# Patient Record
Sex: Female | Born: 1950 | Race: White | Hispanic: No | Marital: Married | Smoking: Never smoker
Health system: Southern US, Community
[De-identification: ages and names within clinical notes are randomized; demographics above are authoritative.]

## PROBLEM LIST (undated history)

## (undated) DIAGNOSIS — I10 Essential (primary) hypertension: Secondary | ICD-10-CM

## (undated) DIAGNOSIS — K649 Unspecified hemorrhoids: Secondary | ICD-10-CM

## (undated) DIAGNOSIS — N289 Disorder of kidney and ureter, unspecified: Secondary | ICD-10-CM

## (undated) DIAGNOSIS — Z9889 Other specified postprocedural states: Secondary | ICD-10-CM

## (undated) DIAGNOSIS — K5790 Diverticulosis of intestine, part unspecified, without perforation or abscess without bleeding: Secondary | ICD-10-CM

## (undated) DIAGNOSIS — D126 Benign neoplasm of colon, unspecified: Secondary | ICD-10-CM

## (undated) DIAGNOSIS — R112 Nausea with vomiting, unspecified: Secondary | ICD-10-CM

## (undated) DIAGNOSIS — IMO0002 Reserved for concepts with insufficient information to code with codable children: Secondary | ICD-10-CM

## (undated) DIAGNOSIS — F419 Anxiety disorder, unspecified: Secondary | ICD-10-CM

## (undated) DIAGNOSIS — M1990 Unspecified osteoarthritis, unspecified site: Secondary | ICD-10-CM

## (undated) HISTORY — DX: Disorder of kidney and ureter, unspecified: N28.9

## (undated) HISTORY — PX: JOINT REPLACEMENT: SHX530

## (undated) HISTORY — DX: Reserved for concepts with insufficient information to code with codable children: IMO0002

## (undated) HISTORY — PX: ROTATOR CUFF REPAIR: SHX139

## (undated) HISTORY — DX: Essential (primary) hypertension: I10

## (undated) HISTORY — DX: Benign neoplasm of colon, unspecified: D12.6

## (undated) HISTORY — PX: COLONOSCOPY: SHX174

## (undated) HISTORY — DX: Unspecified hemorrhoids: K64.9

## (undated) HISTORY — PX: REPLACEMENT DISC ANTERIOR LUMBAR SPINE: SUR1215

## (undated) HISTORY — PX: AIKEN OSTEOTOMY: SHX6331

## (undated) HISTORY — DX: Diverticulosis of intestine, part unspecified, without perforation or abscess without bleeding: K57.90

---

## 1984-12-13 HISTORY — PX: TUBAL LIGATION: SHX77

## 1998-07-22 ENCOUNTER — Ambulatory Visit (HOSPITAL_COMMUNITY): Admission: RE | Admit: 1998-07-22 | Discharge: 1998-07-22 | Payer: Self-pay | Admitting: Family Medicine

## 1998-07-23 ENCOUNTER — Ambulatory Visit (HOSPITAL_COMMUNITY): Admission: RE | Admit: 1998-07-23 | Discharge: 1998-07-23 | Payer: Self-pay | Admitting: Family Medicine

## 1999-02-20 ENCOUNTER — Ambulatory Visit (HOSPITAL_COMMUNITY): Admission: RE | Admit: 1999-02-20 | Discharge: 1999-02-20 | Payer: Self-pay | Admitting: Family Medicine

## 1999-02-20 ENCOUNTER — Encounter: Payer: Self-pay | Admitting: Family Medicine

## 1999-09-04 ENCOUNTER — Ambulatory Visit (HOSPITAL_COMMUNITY): Admission: RE | Admit: 1999-09-04 | Discharge: 1999-09-04 | Payer: Self-pay | Admitting: Family Medicine

## 1999-09-04 ENCOUNTER — Encounter: Payer: Self-pay | Admitting: Family Medicine

## 1999-09-11 ENCOUNTER — Encounter: Payer: Self-pay | Admitting: Family Medicine

## 1999-09-11 ENCOUNTER — Ambulatory Visit (HOSPITAL_COMMUNITY): Admission: RE | Admit: 1999-09-11 | Discharge: 1999-09-11 | Payer: Self-pay | Admitting: Family Medicine

## 1999-09-16 ENCOUNTER — Ambulatory Visit (HOSPITAL_COMMUNITY): Admission: RE | Admit: 1999-09-16 | Discharge: 1999-09-16 | Payer: Self-pay | Admitting: Family Medicine

## 1999-09-16 ENCOUNTER — Encounter: Payer: Self-pay | Admitting: Family Medicine

## 2000-04-08 ENCOUNTER — Encounter: Admission: RE | Admit: 2000-04-08 | Discharge: 2000-04-08 | Payer: Self-pay | Admitting: Family Medicine

## 2000-04-08 ENCOUNTER — Encounter: Payer: Self-pay | Admitting: Family Medicine

## 2000-09-30 ENCOUNTER — Encounter: Payer: Self-pay | Admitting: Family Medicine

## 2000-09-30 ENCOUNTER — Encounter: Admission: RE | Admit: 2000-09-30 | Discharge: 2000-09-30 | Payer: Self-pay | Admitting: Family Medicine

## 2001-09-29 ENCOUNTER — Encounter: Admission: RE | Admit: 2001-09-29 | Discharge: 2001-09-29 | Payer: Self-pay | Admitting: Family Medicine

## 2001-09-29 ENCOUNTER — Encounter: Payer: Self-pay | Admitting: Family Medicine

## 2001-10-06 ENCOUNTER — Encounter: Admission: RE | Admit: 2001-10-06 | Discharge: 2001-10-06 | Payer: Self-pay | Admitting: Family Medicine

## 2001-10-06 ENCOUNTER — Encounter: Payer: Self-pay | Admitting: Family Medicine

## 2002-10-08 ENCOUNTER — Encounter: Admission: RE | Admit: 2002-10-08 | Discharge: 2002-10-08 | Payer: Self-pay | Admitting: Family Medicine

## 2002-10-08 ENCOUNTER — Encounter: Payer: Self-pay | Admitting: Family Medicine

## 2002-10-12 ENCOUNTER — Other Ambulatory Visit: Admission: RE | Admit: 2002-10-12 | Discharge: 2002-10-12 | Payer: Self-pay | Admitting: Family Medicine

## 2003-09-13 ENCOUNTER — Other Ambulatory Visit: Admission: RE | Admit: 2003-09-13 | Discharge: 2003-09-13 | Payer: Self-pay | Admitting: Family Medicine

## 2003-10-11 ENCOUNTER — Encounter: Admission: RE | Admit: 2003-10-11 | Discharge: 2003-10-11 | Payer: Self-pay | Admitting: Family Medicine

## 2004-08-28 ENCOUNTER — Other Ambulatory Visit: Admission: RE | Admit: 2004-08-28 | Discharge: 2004-08-28 | Payer: Self-pay | Admitting: Family Medicine

## 2004-10-12 ENCOUNTER — Encounter: Admission: RE | Admit: 2004-10-12 | Discharge: 2004-10-12 | Payer: Self-pay | Admitting: Family Medicine

## 2005-06-12 HISTORY — PX: ABDOMINAL HYSTERECTOMY: SHX81

## 2005-06-30 ENCOUNTER — Encounter (INDEPENDENT_AMBULATORY_CARE_PROVIDER_SITE_OTHER): Payer: Self-pay | Admitting: *Deleted

## 2005-07-01 ENCOUNTER — Inpatient Hospital Stay (HOSPITAL_COMMUNITY): Admission: RE | Admit: 2005-07-01 | Discharge: 2005-07-02 | Payer: Self-pay | Admitting: Obstetrics and Gynecology

## 2005-07-28 ENCOUNTER — Ambulatory Visit (HOSPITAL_COMMUNITY): Admission: RE | Admit: 2005-07-28 | Discharge: 2005-07-28 | Payer: Self-pay | Admitting: Urology

## 2005-07-28 ENCOUNTER — Ambulatory Visit (HOSPITAL_BASED_OUTPATIENT_CLINIC_OR_DEPARTMENT_OTHER): Admission: RE | Admit: 2005-07-28 | Discharge: 2005-07-28 | Payer: Self-pay | Admitting: Urology

## 2005-10-22 ENCOUNTER — Encounter: Admission: RE | Admit: 2005-10-22 | Discharge: 2005-10-22 | Payer: Self-pay | Admitting: Family Medicine

## 2005-10-31 ENCOUNTER — Emergency Department (HOSPITAL_COMMUNITY): Admission: EM | Admit: 2005-10-31 | Discharge: 2005-10-31 | Payer: Self-pay | Admitting: Emergency Medicine

## 2006-05-15 ENCOUNTER — Emergency Department (HOSPITAL_COMMUNITY): Admission: EM | Admit: 2006-05-15 | Discharge: 2006-05-15 | Payer: Self-pay | Admitting: Emergency Medicine

## 2006-06-23 ENCOUNTER — Ambulatory Visit (HOSPITAL_COMMUNITY): Admission: RE | Admit: 2006-06-23 | Discharge: 2006-06-23 | Payer: Self-pay | Admitting: Urology

## 2006-07-22 ENCOUNTER — Ambulatory Visit (HOSPITAL_COMMUNITY): Admission: RE | Admit: 2006-07-22 | Discharge: 2006-07-22 | Payer: Self-pay | Admitting: Obstetrics and Gynecology

## 2006-09-21 ENCOUNTER — Ambulatory Visit (HOSPITAL_COMMUNITY): Admission: RE | Admit: 2006-09-21 | Discharge: 2006-09-21 | Payer: Self-pay | Admitting: Obstetrics and Gynecology

## 2006-10-28 ENCOUNTER — Encounter: Admission: RE | Admit: 2006-10-28 | Discharge: 2006-10-28 | Payer: Self-pay | Admitting: Family Medicine

## 2007-11-03 ENCOUNTER — Encounter: Admission: RE | Admit: 2007-11-03 | Discharge: 2007-11-03 | Payer: Self-pay | Admitting: Family Medicine

## 2007-11-14 ENCOUNTER — Encounter: Admission: RE | Admit: 2007-11-14 | Discharge: 2007-11-14 | Payer: Self-pay | Admitting: Obstetrics and Gynecology

## 2008-06-03 ENCOUNTER — Encounter: Admission: RE | Admit: 2008-06-03 | Discharge: 2008-06-03 | Payer: Self-pay | Admitting: Family Medicine

## 2008-11-22 ENCOUNTER — Encounter: Admission: RE | Admit: 2008-11-22 | Discharge: 2008-11-22 | Payer: Self-pay | Admitting: Obstetrics and Gynecology

## 2008-12-13 HISTORY — PX: MICRODISCECTOMY LUMBAR: SUR864

## 2009-06-30 ENCOUNTER — Ambulatory Visit (HOSPITAL_COMMUNITY): Admission: RE | Admit: 2009-06-30 | Discharge: 2009-06-30 | Payer: Self-pay | Admitting: Neurological Surgery

## 2009-12-19 ENCOUNTER — Encounter: Admission: RE | Admit: 2009-12-19 | Discharge: 2009-12-19 | Payer: Self-pay | Admitting: Obstetrics and Gynecology

## 2010-01-13 ENCOUNTER — Inpatient Hospital Stay (HOSPITAL_COMMUNITY): Admission: RE | Admit: 2010-01-13 | Discharge: 2010-01-15 | Payer: Self-pay | Admitting: Neurological Surgery

## 2010-12-21 ENCOUNTER — Encounter
Admission: RE | Admit: 2010-12-21 | Discharge: 2010-12-21 | Payer: Self-pay | Source: Home / Self Care | Attending: Obstetrics and Gynecology | Admitting: Obstetrics and Gynecology

## 2011-02-28 LAB — CBC
HCT: 44.8 % (ref 36.0–46.0)
Hemoglobin: 15.5 g/dL — ABNORMAL HIGH (ref 12.0–15.0)
MCHC: 34.6 g/dL (ref 30.0–36.0)
MCV: 91.4 fL (ref 78.0–100.0)
Platelets: 312 10*3/uL (ref 150–400)
RBC: 4.91 MIL/uL (ref 3.87–5.11)
RDW: 14.1 % (ref 11.5–15.5)
WBC: 8.2 10*3/uL (ref 4.0–10.5)

## 2011-02-28 LAB — BASIC METABOLIC PANEL
BUN: 7 mg/dL (ref 6–23)
CO2: 27 mEq/L (ref 19–32)
Calcium: 9.3 mg/dL (ref 8.4–10.5)
Chloride: 106 mEq/L (ref 96–112)
Creatinine, Ser: 0.77 mg/dL (ref 0.4–1.2)
GFR calc Af Amer: >60 mL/min (ref >60)
GFR calc non Af Amer: >60 mL/min (ref >60)
Glucose, Bld: 125 mg/dL — ABNORMAL HIGH (ref 70–99)
Potassium: 3.8 mEq/L (ref 3.5–5.1)
Sodium: 141 mEq/L (ref 135–145)

## 2011-02-28 LAB — TYPE AND SCREEN
ABO/RH(D): O POS
Antibody Screen: NEGATIVE

## 2011-02-28 LAB — ABO/RH: ABO/RH(D): O POS

## 2011-03-03 LAB — GLUCOSE, CAPILLARY: Glucose-Capillary: 157 mg/dL — ABNORMAL HIGH (ref 70–99)

## 2011-03-22 LAB — CBC
HCT: 52.3 % — ABNORMAL HIGH (ref 36.0–46.0)
Hemoglobin: 17.7 g/dL — ABNORMAL HIGH (ref 12.0–15.0)
MCHC: 33.9 g/dL (ref 30.0–36.0)
MCV: 90.3 fL (ref 78.0–100.0)
Platelets: 304 10*3/uL (ref 150–400)
RBC: 5.79 MIL/uL — ABNORMAL HIGH (ref 3.87–5.11)
RDW: 13.7 % (ref 11.5–15.5)
WBC: 9.6 10*3/uL (ref 4.0–10.5)

## 2011-03-22 LAB — BASIC METABOLIC PANEL
BUN: 14 mg/dL (ref 6–23)
CO2: 28 mEq/L (ref 19–32)
Calcium: 10 mg/dL (ref 8.4–10.5)
Chloride: 105 mEq/L (ref 96–112)
Creatinine, Ser: 0.95 mg/dL (ref 0.4–1.2)
GFR calc Af Amer: >60 mL/min (ref >60)
GFR calc non Af Amer: >60 mL/min (ref >60)
Glucose, Bld: 94 mg/dL (ref 70–99)
Potassium: 4.6 mEq/L (ref 3.5–5.1)
Sodium: 139 mEq/L (ref 135–145)

## 2011-04-27 NOTE — Op Note (Signed)
[name], BRAUN NO.:  [id number]   MEDICAL RECORD NO.:  [id number]          PATIENT TYPE:  OIB   LOCATION:  3526                         FACILITY:  [facility]   PHYSICIAN:  Stefani Dama, M.D.  DATE OF BIRTH:  12/22/1950   DATE OF PROCEDURE:  06/30/2009  DATE OF DISCHARGE:  06/30/2009                               OPERATIVE REPORT   PREOPERATIVE DIAGNOSIS:  Herniated nucleus pulposus, L5-S1 left,  extraforaminal with left L5 radiculopathy.   POSTOPERATIVE DIAGNOSIS:  Herniated nucleus pulposus, L5-S1 left,  extraforaminal with left L5 radiculopathy.   PROCEDURE:  L5-S1 METRx discectomy using operating microscope  microdissection technique, L5-S1 extraforaminal.   SURGEON:  Stefani Dama, MD   FIRST ASSISTANT:  Hilda Lias, MD   ANESTHESIA:  General endotracheal.   INDICATIONS:  Lynn Johns is a 60 year old individual who has had  significant back and left lower extremity pain secondary to herniated  nucleus pulposus in the extraforaminal space at L5-S1.  She has tried a  manner of conservative management over the past time including a number  of epidural steroid injections which have not given her substantial  release.  Because of the persistence and severity of the pain and some  weakness that was noted be slight in the tibialis anterior group, she  was ultimately advised regarding surgical decompression.   PROCEDURE:  The patient was brought to the operating room supine on the  stretcher.  After smooth induction of general endotracheal anesthesia,  she was placed prone onto the operating table and the back was prepped  with alcohol and DuraPrep and draped in a sterile fashion.  Fluoroscopic  guidance was used to localize the extraforaminal space just lateral to  the facet complex at L5-S1 on the left side.  Skin above this area was  infiltrated with 1% lidocaine mixed 50:50 with 0.5% Marcaine and  1:100,000 epinephrine.  A total volume of 10  mL was used superficially  and then 10 mL of 0.5% Marcaine was used deep.  K-wire was then passed  the laminar arch of L5 and under fluoroscopic guidance, a winding  technique was used to clear the region of the facet capsule and lateral  aspect of the facet at the L5-S1 space on the left, series of dilators  was then passed over this.  Initial K-wire and initial dilator to dilate  the opening to the 18-mm diameter and ultimately place an 18-mm x 6-cm  deep endoscopic cannula that was fixed to the operating table with the  clamp.  Through this aperture, the microscope was brought into the field  and the soft tissues overlying the outside of the facet joint at L5-S1  were cleared.  The intertransverse muscle at L5-S1 was then cauterized  and divided.  Outer ligaments were then cauterized and divided and a  partial facetectomy removing the superior articular process of S1 was  performed so as to allow better visualization in the intertransverse  space.  With further dissection, the soft tissues were removed and fat  pad overlying the nerve root was removed and the nerve root itself was  identified.  Just inferior to the nerve root, there was a substantial  mass of disk with a pearlescent cap.  After identifying this and then  securing it, the cap was opened with a #15 blade, then a combination of  curettes and rongeurs was used to evacuate a significant quantity of  severely degenerated and desiccated disk material from within the disk  space and from within this extradiscal space also.  There was noted to  be a small bony osteophyte from the inferior margin of the body of L5  out in the lateral recess and this was taken down with a small osteotome  and a curette used to dissect between the nerve root in the osteotome  and protect the nerve root itself.  Once this was removed, further  freedom along the L5 nerve root was identified.  With this, then the  disk space was evacuated both  medially and laterally from all the  degenerated disk material that could be had through this aperture.  Once  this was completed and hemostasis was established, wound was irrigated  copiously with antibiotic irrigating solution.  No other fragments could  be probed medially or laterally.  The nerve root was well decompressed  and at this point, some fentanyl mixed with Depo-Medrol was left in the  epidural space and the cannula was withdrawn.  The superficial fascia  was closed with 3-0 Vicryl interrupted fashion, 3-0 Vicryl using was  used in the subcuticular tissues and Dermabond was placed in the skin.  Blood loss for the procedure was essentially nil.      Stefani Dama, M.D.  Electronically Signed     HJE/MEDQ  D:  06/30/2009  T:  07/01/2009  Job:  [id number]

## 2011-04-30 NOTE — H&P (Signed)
NAME:  Lynn Johns, Lynn Johns NO.:  [id number]   MEDICAL RECORD NO.:  [id number]          PATIENT TYPE:  AMB   LOCATION:  [facility]C                           [facility]ACILITY:  [facility]   PHYSICIAN:  Juluis Mire, M.D.   DATE OF BIRTH:  1951/04/28   DATE OF ADMISSION:  DATE OF DISCHARGE:                                HISTORY & PHYSICAL   Patient is a 60 year old gravida 4, para 4 married female.  Had been  referred to our practice by Dr. Darvin Neighbours for evaluation of symptomatic  pelvic relaxation.  She was having stress incontinence and had undergone  urological evaluation.  Examination in the office revealed a prominent  cystocele and moderate uterine descensus.  Rectocele was not obvious in  vaginal cuff, otherwise seen well supported.  We performed an ultrasound in  the office that was completely unremarkable.  There was no ovarian or  uterine abnormalities.  It is note the patient is indeed menopausal.  Last  menstrual period was in 2002.  The decision after discussion of options was  to proceed with a laparoscopic assisted vaginal hysterectomy with bilateral  salpingo-oophorectomy, anterior and posterior repair, and possible  sacrospinous ligament suspension.  Dr. Earlene Plater will be doing a urethral sling.  We discussed the use of graft material and supporting both anterior and  posteriorly.  Patient agrees with this.  Does understand alternatives of  conservative follow-up or use of pessary.   ALLERGIES:  No known drug allergies.   MEDICATIONS:  Atenolol, lisinopril with hydrochlorothiazide, and Calcitrate  calcium replacement.   She is followed by Dr. Gerri Spore for her hypertension.   PAST MEDICAL HISTORY:  Hypertension under active management.   PAST SURGICAL HISTORY:  She has had previous bilateral tubal ligation in  1986.   OBSTETRICAL HISTORY:  Four vaginal deliveries.   FAMILY HISTORY:  Father with history of hypertension and heart disease.   SOCIAL HISTORY:  No smoking  or alcohol use.   REVIEW OF SYSTEMS:  Noncontributory.   PHYSICAL EXAMINATION:  VITAL SIGNS:  Patient is afebrile with stable vital  signs.  HEENT:  Patient normocephalic.  Pupils are equal, round, and reactive to  light and accommodation.  Extraocular movements were intact.  Sclerae and  conjunctivae clear.  Oropharynx clear.  NECK:  Without thyromegaly.  BREASTS:  Not examined.  LUNGS:  Clear.  CARDIOVASCULAR:  Regular rate and rhythm without murmurs or gallops.  ABDOMEN:  Benign.  No masses, organomegaly, or tenderness.  PELVIC:  Normal external genitalia.  Vaginal mucosa:  Prominent cystocele  and mild uterine descensus.  Cuff otherwise well supported.  Bimanual  examination:  Uterus of normal size and shape.  Adnexa unremarkable.  Rectovaginal examination is clear.  EXTREMITIES:  Trace edema.  NEUROLOGIC:  Grossly within normal limits.   IMPRESSION:  1.  Symptomatic pelvic relaxation with associated stress incontinence.  2.  Hypertension.   PLAN:  Presently, our plan is to proceed with a laparoscopic assisted  vaginal hysterectomy with bilateral salpingo-oophorectomy.  After discussion  of options decision was to proceed with ovarian removal.  She does  understand that ovaries do continue to provide some hormonal support  __________ testosterone and that some supplementation may be required.  Once  the hysterectomy is completed, will proceed with a cystocele repair.  Will  probably use the PelviSoft sling material for support.  From there Dr. Earlene Plater  will proceed with a mid urethral sling and then we can decide whether any  posterior repair or vaginal vault suspension needs to be undertaken.  The  risks of the surgery have been discussed.  First, we have discussed the  potential recurrence for pelvic relaxation, that this is not 100%.  The  other risks include the risks of infection, risk of hemorrhage that could  require transfusion with the risk of AIDS or hepatitis, risk  of injury to  adjacent organs including bladder, bowel, or ureters that could require  further exploratory surgery, risk of deep venous thrombosis and pulmonary  embolus.  Patient expressed understanding of indications and risks and  acceptance of them.       JSM/MEDQ  D:  06/30/2005  T:  06/30/2005  Job:  [id number]

## 2011-04-30 NOTE — Op Note (Signed)
NAME:  Lynn Johns, Lynn Johns              ACCOUNT NO.:  [id number]   MEDICAL RECORD NO.:  [id number]          PATIENT TYPE:  OBV   LOCATION:  9399                          FACILITY:  [facility]   PHYSICIAN:  Ronald L. Earlene Plater, M.D.  DATE OF BIRTH:  May 09, 1951   DATE OF PROCEDURE:  06/30/2005  DATE OF DISCHARGE:                                 OPERATIVE REPORT   DIAGNOSIS:  Type 3 stress urinary incontinence.   OPERATIVE PROCEDURE:  Placement of [employer] suprapubic  pubovaginal sling.   SURGEON:  Lucrezia Starch. Earlene Plater, M.D.   ASSISTANT:  Juluis Mire, M.D.   ANESTHESIA:  General endotracheal.   ESTIMATED BLOOD LOSS:  50 mL.   COMPLICATIONS:  None.   INDICATION FOR PROCEDURE:  [name] is a lovely 60 year old white female  who presents with a cystocele and pelvic floor prolapse, needs a  hysterectomy but had stress urinary incontinence.  On workup, she was found  to have a significantly decreased leak point pressure with stable detrusor  on urodynamics.  After understanding the risks, benefits, and alternatives,  she has elected to proceed with a sling in conjunction with her gynecologic  procedure.   PROCEDURE IN DETAIL:  After Dr. Arelia Sneddon completed the hysterectomy, had the  urethrovesical mucosa dissected, I was asked into the operating room to put  in the sling.  Punch holes were made approximately one fingerbreadth  superior and two fingerbreadths lateral to the midline above the pubic  symphysis, and the endopelvic fascia had been perforated bilaterally with a  finger in the space of Retzius.  The needles were delivered through the  operative field.  Cystourethroscopy was performed with a 22.5 [place] Wolf  panendoscope and the bladder was distended and with a 70 degree lens,  carefully inspected.  Efflux of clear urine was noted from the normally-  placed ureteral orifices bilaterally, and there were no perforations noted.  The bladder was drained, and it might be noted that  __________ had been made  after the bladder had been drained with a Foley catheter.  The [employer] suprapubic sling was placed in position and deployed and noted to  be freely deployed with a space between it and the urethra appropriately.  Thorough irrigation was performed and good hemostasis was noted to be  present.  Reinspection revealed no evidence of sling material within the bladder,  again with the 70 degree lens, fully distended bladder.  When the bladder  was drained, the suprapubic sling was cut below the skin level.  Dr. Arelia Sneddon  will seal it with Dermabond and will close the wound.       RLD/MEDQ  D:  06/30/2005  T:  06/30/2005  Job:  [id number]   cc:   Juluis Mire, M.D.  [address] [place]  [place] [address]  Fax: [phone]

## 2011-04-30 NOTE — Discharge Summary (Signed)
Lynn, Johns NO.:  [id number]   MEDICAL RECORD NO.:  [id number]          PATIENT TYPE:  INP   LOCATION:  9309                          FACILITY:  [facility]   PHYSICIAN:  Juluis Mire, M.D.   DATE OF BIRTH:  May 30, 1951   DATE OF ADMISSION:  06/30/2005  DATE OF DISCHARGE:  07/02/2005                                 DISCHARGE SUMMARY   ADMITTING DIAGNOSES:  Symptomatic pelvic relaxation with associated stress  incontinence.   DISCHARGE DIAGNOSES:  Symptomatic pelvic relaxation with associated stress  incontinence.   OPERATION/PROCEDURE:  1.  Laparoscopic assisted vaginal hysterectomy with bilateral salpingo-      oophorectomy.  2.  Anterior and posterior colporrhaphy.  3.  Sacrospinous ligament suspension.  4.  Mid urethral tension-free sling.   For complete history and physical please see dictated note.   HOSPITAL COURSE:  Patient underwent above-noted surgery.  Pathology is still  pending.  Postoperatively did well.  Postoperative hemoglobin was 11.9.  She  was unable to void after discontinuing the Foley.  She was in-and-out  catheterized.  Despite this continued to have difficulty voiding.  A Foley  was placed to straight drain and left through the evening of the 20th.  On  the 21st which was her second postoperative day she had good urine output.  She was tolerating a regular diet and ambulating without difficulty.  She  was having minimal vaginal bleeding at that point.  Abdominal examination  was benign.  All incisions were intact.  There was some ecchymoses.  She had  normal bowel sounds and was passing flatus.  Decision was to discharge her  home at this time.   COMPLICATIONS:  None encountered during stay in the hospital.  Patient was  discharged home in stable condition.   DISPOSITION:  We are going to send the patient home with a Foley in place.  Hopefully the swelling will reduce.  We will be able to refer her back to  see the urologist on  Monday so they can further manage the catheter.  Discharge instructions are given.  She is avoid heavy lifting, vaginal  entrance, or driving a car.  She will watch for signs of infection, nausea,  vomiting, increasing abdominal pain, or active vaginal bleeding.  Discharged  home on Percocet as she needs for pain and Keflex as a prophylaxis of  leaving the catheter in place.       JSM/MEDQ  D:  07/02/2005  T:  07/02/2005  Job:  [id number]

## 2011-04-30 NOTE — Op Note (Signed)
Lynn Johns, Lynn Johns NO.:  [id number]   MEDICAL RECORD NO.:  [id number]          PATIENT TYPE:  AMB   LOCATION:  SDC                           [facility]   PHYSICIAN:  Juluis Mire, M.D.   DATE OF BIRTH:  [birth year]   DATE OF PROCEDURE:  10/06/2006  DATE OF DISCHARGE:  09/21/2006                                 OPERATIVE REPORT   PREOPERATIVE DIAGNOSIS:  Recurrent cystocele.   POSTOPERATIVE DIAGNOSIS:  Recurrent cystocele.   OPERATIVE PROCEDURE:  Repair of cystocele using the [employer]ee sling system.  Cystoscopy.   SURGEON:  Juluis Mire, M.D.   ANESTHESIA:  General endotracheal.   ESTIMATED BLOOD LOSS:  Minimal.   PACKS AND DRAINS:  None.   INTRAOPERATIVE BLOOD REPLACED:  None.   COMPLICATIONS:  None.   INDICATIONS:  Dictated in history and physical.   PROCEDURE:  The patient was taken to the OR, placed supine position.  After  satisfactory level of general tracheal anesthesia obtained, the patient was  placed in dorsal lithotomy position.  The lower abdomen, perineum, vagina  prepped out with Betadine and draped in sterile field.  Exam revealed a  large recurrent cystocele.  Posteriorly she had good support.  The uterus  and cervix were surgically absent.  The vaginal cuff was well supported.  We  first injected dilute solution of Xylocaine and epinephrine into the vaginal  mucosa under the cystocele.  Next a midline vaginal incision was made distal  to the bladder neck over the more prominent part of the cystocele.  We were  several centimeters short of the vaginal apex.  We then bluntly and sharply  dissected out laterally on both sides until we could reach the ischial spine  as well as the obturator foramen.  We then wanted to identify the sites  externally.  We identified the edge of the ischial pubic ramus.  We marked  at the level of the clitoris but below the insertion of the abductus longus  tendon.  Same site was marked on both sides.   We then went 3 cm inferior and  2 cm lateral and made our inferior spots.  These were just above the  inferior pubic ramus.  We then made stab incisions.  We first used the  superior needles they were inserted perpendicularly.  They were pushed  through the obturator muscle and fascia and brought out through the vaginal  incision on each side.  The porcine mesh system was brought in place.  The  upper arms were attached to needles and these were rotated back out bringing  the arms through the skin incisions.  These were then placed on the  patient's upper abdomen.  We then used the inferior needles placed them at  90 degrees.  We went through the obturator foramen and directed them back to  the ischial spine.  We brought them out just in front of the ischial spine  on both sides.  Again inferior arms were attached to these and also brought  out.  We then performed cystoscopy.  There was no bladder  or urethral  injury, ureteral orifices were noted to be spilling clear urine.  At this  point in time we adjusted the mesh by pulling on the arms.  We then at this  point in time, closed the vaginal mucosa running suture of 2-0 Vicryl.  We  did not trim it.  We then readjusted the arms removed the plastic sleeves  and cut the arms flush to the skin.  The skin was then closed with  Dermabond.  Foley had been placed to straight drain.  We retrieved at  adequate amount of clear urine.  There was no active vaginal bleeding.  Sponge, instrument and needle count reported correct by circulating nurse  x2.  The patient was extubated, transferred recovery room in good condition.      Juluis Mire, M.D.  Electronically Signed     JSM/MEDQ  D:  10/06/2006  T:  10/07/2006  Job:  [id number]

## 2011-04-30 NOTE — H&P (Signed)
NAME:  Lynn Johns, Lynn Johns NO.:  [id number]   MEDICAL RECORD NO.:  [id number]          PATIENT TYPE:  AMB   LOCATION:  SDC                           [facility]   PHYSICIAN:  Juluis Mire, M.D.   DATE OF BIRTH:  18-Aug-1951   DATE OF ADMISSION:  09/21/2006  DATE OF DISCHARGE:                                HISTORY & PHYSICAL   The patient is a 60 year old gravida 4, para 4 female presents for repair of  recurrent cystocele.   In relation to the present admission, the patient underwent a previous  laparoscopic-assisted vaginal hysterectomy with bilateral salpingo-  oophorectomy, anterior and posterior repair with sacrospinous ligament  suspension in July of 2006.  Dr. Darvin Neighbours did do a suburethral sling at  that time.  Because of voiding difficulties she eventually had the sling  partially taking down by Dr. Perley Jain.  She had been voiding better, but  does have a recurrent cystocele which she wishes repaired.  Otherwise,  vaginal support has remained excellent.  She has no postvoid residuals at  this point in time by ultrasound.  We have placed her on estrogen vaginal  cream and she still has the pressure from the cystocele and wishes this  repaired. She presents at the present time.  We will be using the Perigee  system for the repair.   ALLERGIES:  NO KNOWN DRUG ALLERGIES NOTED.   MEDICATIONS:  1. She is on atenolol 100 mg.  2. Lisinopril-hydrochlorothiazide 10/12 0.5.  3. Supplementations.   PAST MEDICAL HISTORY:  Significant for history of hypertension under  evaluation and management by Dr. Gerri Spore. Otherwise the usual childhood  diseases.   PAST SURGICAL HISTORY:  She had a previous bilateral tubal ligation in 1986  and had the previous noted surgery including the laparoscopic assisted  vaginal hysterectomy with bilateral salpingo-oophorectomy, A and P repair  and suburethral sling.  Subsequent had the sling taken down.   OBSTETRICAL HISTORY:   She has had four vaginal deliveries.   FAMILY HISTORY:  Father has a history of hypertension and heart disease.   SOCIAL HISTORY:  No tobacco or alcohol use.   REVIEW OF SYSTEMS:  Noncontributory.   PHYSICAL EXAMINATION:  The patient is afebrile with stable vital signs.  HEENT:  The patient normocephalic.  The pupils are equal, round and reactive  to light and accommodation.  Extraocular movements were intact.  Sclerae and  conjunctiva are clear.  Oropharynx clear.  NECK:  Without thyromegaly.  BREASTS:  Not examined.  LUNGS:  Clear.  CARDIOVASCULAR:  Regular rhythm and rate without murmurs or gallops.  ABDOMEN:  Benign.  No mass, organomegaly or tenderness.  PELVIC:  Normal external genitalia.  Vaginal mucosa reveals a prominent  cystocele, otherwise excellent support.  Cuff is intact.  Bimanual  examination; no masses appreciated.  RECTOVAGINAL:  Clear.  EXTREMITIES:  Trace edema.  NEUROLOGIC:  Grossly within normal limits.   IMPRESSION:  Recurrent cystocele.   PLAN:  The patient will undergo attempted anterior repair using the Perigee  system.  The risks have been discussed.  This includes  the potential risk of  recurrent cystocele.  The potential risk of infection.  The risk of  hemorrhage that could require transfusion with the risk of AIDS or  hepatitis.  The risk of injury to adjacent organs including bladder, ureter,  urethra and bowel that could require further exploratory surgery, risk of  deep venous thrombosis and pulmonary embolus.  Risk of obturator nerve  injury discussed.  This could lead to chronic leg pain and weakness.  There  is also the potential risk of mesh erosions leading to further surgical  management.  The patient expressed understanding of  indications and risks.      Juluis Mire, M.D.  Electronically Signed     JSM/MEDQ  D:  09/21/2006  T:  09/21/2006  Job:  [id number]

## 2011-04-30 NOTE — Op Note (Signed)
NAMEHARLEE, Johns              ACCOUNT NO.:  [id number]   MEDICAL RECORD NO.:  [id number]          PATIENT TYPE:  AMB   LOCATION:  [facility]                         FACILITY:  [facility]   PHYSICIAN:  Ronald L. Earlene Plater, M.D.  DATE OF BIRTH:  1951-07-20   DATE OF PROCEDURE:  07/28/2005  DATE OF DISCHARGE:                                 OPERATIVE REPORT   OPERATIVE PROCEDURE:  Cystourethroscopy, placement of [employer]  suprapubic tube.   SURGEON:  Dr. Gaynelle Arabian.   ANESTHESIA:  LMA.   ESTIMATED BLOOD LOSS:  Negligible.   TUBES:  14 French Fader Tip suprapubic tube.   COMPLICATIONS:  None.   INDICATIONS FOR PROCEDURE:  Lynn Johns is a lovely 60 year old white female  who is status post vaginal hysterectomy, vaginal vault repair, with Pelvicol  and placement of a suprapubic sling.  She has developed urinary retention  postoperatively and failed multiple voiding trials.  She really refuses to  undergo intermittent catheterization.  After understanding the risks,  benefits and alternatives, has elected to proceed with a temporary  suprapubic tube so that she can clamp and not perform voiding trials.   PROCEDURE IN DETAIL:  Patient was placed in a supine position.  After proper  LMA anesthesia, placed in the dorsal lithotomy position.  Prepped and draped  with Betadine in a sterile fashion.  On bimanual examination, the vaginal  repair appears to be healing very well.  There is no angulation at all of  the urethra.  The bladder is in good position.  Cystourethroscopy was  performed.  The bladder was smooth-walled.  Efflux of clear urine was noted  bilaterally.  A 22.5 French Olympus pan endoscope was utilized with the 12  and 70 degree lenses.  The bladder was then filled.  A punch suprapubic tube  was placed with a 14 [place] [employer] suprapubic tube  through a small punch incision suprapubically and visualized into the  bladder.  Secured into position utilizing  the COPE system.  It was capped.  The patient will perform voiding trials.  The bladder was drained.  Pan  endoscope was removed.  Patient was taken to the recovery room stable.      Ronald L. Earlene Plater, M.D.  Electronically Signed     RLD/MEDQ  D:  07/28/2005  T:  07/28/2005  Job:  [id number]   cc:   Juluis Mire, M.D.  [address] [place]  [place] [address]  Fax: [phone]

## 2011-04-30 NOTE — Op Note (Signed)
Lynn Johns, Lynn Johns              ACCOUNT NO.:  [id number]   MEDICAL RECORD NO.:  [id number]          PATIENT TYPE:  AMB   LOCATION:  DAY                          FACILITY:  [facility]   PHYSICIAN:  Martina Sinner, MD DATE OF BIRTH:  January 17, 1951   DATE OF PROCEDURE:  06/23/2006  DATE OF DISCHARGE:                                 OPERATIVE REPORT   PREOPERATIVE DIAGNOSIS:  Urinary retention.   POSTOPERATIVE DIAGNOSIS:  Urinary retention.   PROCEDURE:  Urethrolysis plus cystoscopy.   INDICATIONS FOR PROCEDURE:  Lynn Johns has effective voiding symptoms  and retention.  She presented to undergo a urethrolysis.   DESCRIPTION OF PROCEDURE:  The patient was prepped and draped in the usual  fashion.  Extra care was taken to minimize the risks of compartment  syndrome, neuropathy, and DVT.  She was given preoperative Unasyn and  gentamycin.   The patient initially underwent cystoscopy.  She did have a grade 2  cystocele.  Palpably and visually, I thought the sling was quite proximal  just at the junction of the cystocele and urethra which was approximately  2.5 to 3 cm in length.   I used an Allis clamp and counter traction to draw out a 3-cm incision with  a marking pen.  I dissected down through the pubocervical fascia and vaginal  epithelium in the area that I thought I could feel the sling.  I could  finally feel the sling with the cystoscopy sheath and obturator in place and  deflecting it in a hypermobility fashion.  I easily incised down to the  sling and above and below it a few millimeters.  I passed a small right-  angle around the sling.  I released the sling.  I cut the sling in half,  releasing the urethra.  I placed an Allis clamp on the proximal edge of the  sling and traced it up towards the endopelvic fascia bilaterally for  approximately 1 cm.   I re-cystoscoped the patient.  There was no injury to the urethra.  Visually, the urethra looked normal.  Hemostasis  was excellent.  Total blood  loss was less than 30 cc.   I copiously irrigated the area with saline and double antibiotic.  I closed  the incision with running 2-0 Vicryl.  I did my second reinforcing sutures  in interrupted fashion.  I placed a vaginal pack and Foley catheter.   She will have a voiding trial on UroXatral in the recovery room.  Hopefully,  I will be able to stop the UroXatral in the next week or so.          ______________________________  Martina Sinner, MD  Electronically Signed    SAM/MEDQ  D:  06/23/2006  T:  06/23/2006  Job:  [id number]

## 2011-04-30 NOTE — Op Note (Signed)
NAME:  Lynn Johns, Lynn Johns NO.:  [id number]   MEDICAL RECORD NO.:  [id number]          PATIENT TYPE:  OBV   LOCATION:  9309                          FACILITY:  [facility]   PHYSICIAN:  Juluis Mire, M.D.   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   OPERATION/PROCEDURE:  1.  Laparoscopic-assisted vaginal hysterectomy with bilateral salpingo-      oophorectomy.  2.  Anterior repair using PelviSoft mesh.  3.  Posterior repair using PelviSoft mesh.  4.  Sacrospinous ligament suspension.   SURGEON:  Juluis Mire, M.D.   ASSISTANT:  Ginger Carne, MD   ANESTHESIA:  General endotracheal anesthesia.   ESTIMATED BLOOD LOSS:  400 mL.   PACKS AND DRAINS:  Included vaginal pack and urethral Foley.   INTRAOPERATIVE BLOOD REPLACEMENT:  None.   COMPLICATIONS:  None.   INDICATIONS:  Were dictated in the history and physical.   DESCRIPTION OF PROCEDURE:  The patient was taken to the OR, placed in the  supine position.  After satisfactory level of general endotracheal  anesthesia was obtained, the patient was placed in the dorsal lithotomy  position using the Allen stirrups.  The abdomen and perineum and vagina were  prepped out with Betadine.  A Hulka tenaculum was put in place.  Bladder was  emptied by catheterization.  The patient was then draped in a sterile field.   Subumbilical incision was made with the knife and carried through the  subcutaneous tissue.  Fascia was identified and entered sharply and incision  extended laterally.  Peritoneum was identified, entered bluntly using the  finger.  The  Taut laparoscopic trocar was put in place and secured.  Abdomen  was inflated with carbon dioxide.  Laparoscope was introduced.  There was no  evidence of injury to adjacent organs.  A 5 mm trocar was put in place in  the suprapubic area under direct visualization.  Upper abdomen including  liver and both lateral gutters were clear.  Appendix was visualized and  noted to be unremarkable.  Uterus was of normal size and shape.  Tubes and  ovaries were unremarkable.  The uterus was then elevated.  First the right  ovary was identified and elevated.  The right ureter was easily visualized  within the pelvic sidewall.  And going over the pelvic brim.  Using the  Gyrus bipolar, the ovarian vasculature was cauterized and incised.  The  peritoneal attachment of the ovary and tube to the pelvic sidewall was then  cauterized, incised up to the round ligament which was then cauterized and  incised.  We then went to the left ovary which was elevated.  The left  ureter was easily identified.  Left ovarian vasculature was cauterized and  incised above the ureter.  The peritoneal attachments of  the left ovary and  tube were then cauterized, incised up to the round ligament which was then  cauterized and incised.  We then developed the broad ligament on both sides.  We also developed the bladder flap using the Gyrus bipolar.  We had good  hemostasis and release of the uterus.   Abdomen was deflated of carbon dioxide.  Laparoscopic was removed.  The  patient's legs were repositoned, weighted speculum was placed in the vaginal  vault.  Cervix was grasped with a Jacob's tenaculum.  Cul-de-sac was entered  sharply.  Uterosacral ligaments were clamped, cut and suture ligated with 0  Vicryl with these being held.  Reflection of the vaginal mucosa anteriorly  was incised and the bladder was dissected superiorly.  Paracervical tissue  was clamped, cut and suture ligated with 0 Vicryl.  Vesicouterine space was   entered and retractor was put in place.  Using the clamp, cut and tie  technique with a suture ligature of 0 Vicryl, the parametrium was serially  separated from the side of the uterus.  The uterus was then flipped.  The  remaining pedicles were clamped and cut and the uterus, tubes and ovaries  were passed off the operative field.  All pedicles secured with free ties of  0 Vicryl.  A uterosacral plication stitch with 0 Vicryl was put in place and  secured.  The cuff was then closed beginning posteriorly with beginning  posteriorly with figure-of-eight of 0 Vicryl.  We did not complete the  closure anteriorly.   At this point in time, the mucosa under the bladder was infiltrated with  Marcaine and epinephrine.  A midline incision was made below the urethra  down to the top of the vaginal cuff.  We then dissected the vaginal mucosa  from the underlying pubocervical vaginal fascia.  We developed both sides  nicely.  We dissected along the pubic ramus, developing the space on both  sides.  Next, the uterosacral ligament was identified on each side.  Using  the [facility] needle passer, a suture of 0 Vicryl was placed into the ligament  next to the sacrum.  This was brought out through the paravaginal area.  We  then used two interrupted sutures of 2-0 Vicryl for repair for reduction of  the cystocele.  Next, using the PelviSoft, the edges were split, secured to  the held 0 Vicryl sutures and both ends were secured down to the uterosacral  ligaments.  The more distal end of the PelviSoft was then secured to the  white line with interrupted figure-of-eights of 2-0 Vicryl.  We had good  reduction of the cystocele.  We completed the closure of the anterior cuff  with interrupted figure-of-eights of 0 Vicryl.  We left the upper part open.   Dr. Earlene Plater then came in and placed the suburethral sling.  The remaining anterior vaginal mucosa was closed with a running suture of 2-0 Vicryl.  We  had clear  urine output and good hemostasis.  Visualization did reveal a  moderate rectocele and there was some vaginal cuff prolapse.  Decision was  to proceed with a posterior repair and sacrospinous ligament suspension.   The skin over the perineum was excised in a V fashion up to the vaginal  opening.  The vaginal mucosa was then undermined in the midline and  dissected in the midline.  We then dissected the vaginal mucosa from the  underlying perirectal fascia.  Perirectal space on both sides we developed  down to the sacrospinous ligament.  At this point in time, again using the  kathio needle passer and sutures of 0 Vicryl, these were secured into the  sacrospinous ligament near the sacrum.  Again we brought the PelviSoft in  place.  We split the ends of it, secured these to the held sutures and tied  them down to both uterosacral ligaments.  We then trimmed up the more distal  end and secured it to the peritoneal body and sidewall with interrupted  sutures of 2-0 Vicryl.  We had good reduction of the rectocele with this.  We then, at this point in time, brought in the Franklin again, this time with  0 Prolene, secured sutures on each side to the sacrospinous ligament, again  near the sacrum and then secured these to the top of the vaginal cuff in the  helical fashion.  These were both tied down.  We had good approximation of  the vaginal cuff on both sides to the sacrospinous ligament.  At this point  in time, we had good hemostasis.   The vaginal mucosa was then reapproximated with running suture of 2-0  Vicryl.  The perineal body was rebuilt with  2-0 Vicryl and skin on the perineal body was closed with a running  subcuticular of 2-0 Vicryl.  We had good hemostasis and good approximation.  Rectal exam was unremarkable.  There was no evidence of entry into the  rectum.  At this point in time, the patient's legs were brought down.  Abdomen was reinflated with carbon dioxide.  We reinserted the  laparoscope,  used the Gyrus irrigation and suction to completely irrigate the pelvis.  We  had good hemostasis at the vaginal cuff in both areas where the ovaries had  been removed.  There was no active bleeding.  We deflated the abdomen,  revisualized.  Again, no bleeding was encountered.  The abdomen was deflated  of carbon dioxide.  All trocars were then removed.  Subumbilical fascia was  closed with two figure-of-eights with 0 Vicryl.  Skin was closed with  interrupted subcuticulars of  4-0 Vicryl.  Suprapubic incisions were closed  with Dermabond.  A two-inch vaginal pack was put in place.  We continued to  have clear adequate urine output.  The patient was taken out of the dorsal  lithotomy position and once alert and extubated was transferred to the  recovery room in good condition.  Sponge, instrument and needle counts  reported as correct by the circulating nurse x2.      JSM/MEDQ  D:  06/30/2005  T:  07/01/2005  Job:  [id number]

## 2011-09-08 ENCOUNTER — Ambulatory Visit
Admission: RE | Admit: 2011-09-08 | Discharge: 2011-09-08 | Disposition: A | Discharge status: Home / Self Care | Payer: BC Managed Care – PPO | Source: Ambulatory Visit | Attending: Family Medicine | Admitting: Family Medicine

## 2011-09-08 ENCOUNTER — Other Ambulatory Visit: Payer: Self-pay | Admitting: Family Medicine

## 2011-09-08 DIAGNOSIS — R05 Cough: Secondary | ICD-10-CM

## 2011-09-08 DIAGNOSIS — R062 Wheezing: Secondary | ICD-10-CM

## 2011-09-08 DIAGNOSIS — R059 Cough, unspecified: Secondary | ICD-10-CM

## 2011-11-13 HISTORY — PX: OTHER SURGICAL HISTORY: SHX169

## 2011-11-15 ENCOUNTER — Other Ambulatory Visit: Payer: Self-pay | Admitting: Obstetrics and Gynecology

## 2011-11-15 DIAGNOSIS — Z1231 Encounter for screening mammogram for malignant neoplasm of breast: Secondary | ICD-10-CM

## 2012-01-03 ENCOUNTER — Ambulatory Visit
Admission: RE | Admit: 2012-01-03 | Discharge: 2012-01-03 | Disposition: A | Discharge status: Home / Self Care | Payer: BC Managed Care – PPO | Source: Ambulatory Visit | Attending: Obstetrics and Gynecology | Admitting: Obstetrics and Gynecology

## 2012-01-03 DIAGNOSIS — Z1231 Encounter for screening mammogram for malignant neoplasm of breast: Secondary | ICD-10-CM

## 2012-04-01 ENCOUNTER — Other Ambulatory Visit: Payer: Self-pay | Admitting: Neurological Surgery

## 2012-04-12 ENCOUNTER — Telehealth: Payer: Self-pay | Admitting: Vascular Surgery

## 2012-04-12 NOTE — Telephone Encounter (Signed)
Spoke with patient with appt date and time. Patient is scheduled to have surgery on 05/23/12, needs to meet with [facility] before surgery for clearance. I spoke with referring office, Dr. Danielle Dess per Shanda Bumps, she confirmed appt with patient and reconfirmed with the patient why the appt is necessary.

## 2012-04-12 NOTE — Telephone Encounter (Signed)
Message copied by Sara Chu on Wed Apr 12, 2012  4:22 PM ------      Message from: Melene Plan      Created: Wed Apr 12, 2012  1:26 PM       Dr Danielle Dess has asked for Dr Ilean Skill to assist him with an ALIF L3-4. His office is to call and schedule but Dr Ilean Skill wants to see her in the office first.      Thanks      Darel Hong

## 2012-04-14 ENCOUNTER — Encounter: Payer: Self-pay | Admitting: Vascular Surgery

## 2012-04-17 ENCOUNTER — Encounter: Payer: Self-pay | Admitting: Vascular Surgery

## 2012-04-18 ENCOUNTER — Ambulatory Visit (INDEPENDENT_AMBULATORY_CARE_PROVIDER_SITE_OTHER): Payer: BC Managed Care – PPO | Admitting: Vascular Surgery

## 2012-04-18 ENCOUNTER — Encounter: Payer: Self-pay | Admitting: Vascular Surgery

## 2012-04-18 VITALS — BP 138/89 | HR 95 | Resp 18 | Ht 65.0 in | Wt 155.4 lb

## 2012-04-18 DIAGNOSIS — IMO0002 Reserved for concepts with insufficient information to code with codable children: Secondary | ICD-10-CM | POA: Insufficient documentation

## 2012-04-18 NOTE — Progress Notes (Signed)
The patient presents today for discussion of anterior exposure for L3-L4 discectomy. She underwent a prior uneventful anterior approach for L5-S1 disc repair with Dr. Danielle Dess. As was 2 years ago. She has now had a new L3-4 disc disease. He has been better recommended her to undergo anterior approach and she is seeing me for preoperative discussion of this. He does not have any history of peripheral vascular occlusive disease. She does not have any history of cardiac disease.   Past Medical History  Diagnosis Date  . Cystocele   . Hypertension     History  Substance Use Topics  . Smoking status: Never Smoker   . Smokeless tobacco: Not on file  . Alcohol Use: Yes     1 glass of wine weekly    Family History  Problem Relation Age of Onset  . Heart disease Father   . Hypertension Father     Allergies  Allergen Reactions  . Fentanyl     Rash, insomnia, uncontrollable crying  . Versed (Midazolam)     Rash, insomnia, memory loss    Current outpatient prescriptions:atenolol (TENORMIN) 100 MG tablet, Take 100 mg by mouth daily., Disp: , Rfl: ;  losartan-hydrochlorothiazide (HYZAAR) 100-12.5 MG per tablet, Take 1 tablet by mouth daily., Disp: , Rfl: ;  lisinopril-hydrochlorothiazide (PRINZIDE,ZESTORETIC) 10-12.5 MG per tablet, Take 1 tablet by mouth daily., Disp: , Rfl:   BP 138/89  Pulse 95  Resp 18  Ht 5\' 5"  (1.651 m)  Wt 155 lb 6.4 oz (70.489 kg)  BMI 25.86 kg/m2  Body mass index is 25.86 kg/(m^2).       Review of systems is totally negative aside from above  Physical exam: Well-developed well nourished white female no acute distress. 2+ radial 2+ femoral and 2+ popliteal pulses bilaterally. She does have a prior left transverse lower abdominal incision from her prior L5-S1 surgery. No evidence of abdominal hernias and no evidence of abdominal masses.  Impression and plan: L3-4 disc disease. I discussed my role in the exposure. I did explain the potential for scarring with  her prior surgery. I explained mobilization of intraperitoneal contents, left ureter, and aortoiliac arterial and venous segments. The potential injury of all these. Patient understands and wished to proceed with surgery which is scheduled for 05/19/2012

## 2012-05-02 ENCOUNTER — Encounter: Payer: BC Managed Care – PPO | Admitting: Vascular Surgery

## 2012-05-04 ENCOUNTER — Encounter (HOSPITAL_COMMUNITY): Payer: Self-pay | Admitting: Pharmacy Technician

## 2012-05-09 ENCOUNTER — Other Ambulatory Visit: Payer: Self-pay

## 2012-05-10 ENCOUNTER — Encounter (HOSPITAL_COMMUNITY)
Admission: RE | Admit: 2012-05-10 | Discharge: 2012-05-10 | Disposition: A | Discharge status: Home / Self Care | Payer: BC Managed Care – PPO | Source: Ambulatory Visit | Attending: Neurological Surgery | Admitting: Neurological Surgery

## 2012-05-10 ENCOUNTER — Inpatient Hospital Stay (HOSPITAL_COMMUNITY): Admission: RE | Admit: 2012-05-10 | Discharge: 2012-05-10 | Payer: BC Managed Care – PPO | Source: Ambulatory Visit

## 2012-05-10 ENCOUNTER — Encounter (HOSPITAL_COMMUNITY): Payer: Self-pay

## 2012-05-10 HISTORY — DX: Other specified postprocedural states: Z98.890

## 2012-05-10 HISTORY — DX: Nausea with vomiting, unspecified: R11.2

## 2012-05-10 LAB — CBC
HCT: 48.1 % — ABNORMAL HIGH (ref 36.0–46.0)
Hemoglobin: 16.8 g/dL — ABNORMAL HIGH (ref 12.0–15.0)
MCH: 31.2 pg (ref 26.0–34.0)
MCHC: 34.9 g/dL (ref 30.0–36.0)
MCV: 89.4 fL (ref 78.0–100.0)
Platelets: 302 10*3/uL (ref 150–400)
RBC: 5.38 MIL/uL — ABNORMAL HIGH (ref 3.87–5.11)
RDW: 14.7 % (ref 11.5–15.5)
WBC: 13.1 10*3/uL — ABNORMAL HIGH (ref 4.0–10.5)

## 2012-05-10 LAB — BASIC METABOLIC PANEL
BUN: 11 mg/dL (ref 6–23)
CO2: 28 mEq/L (ref 19–32)
Calcium: 9.9 mg/dL (ref 8.4–10.5)
Chloride: 100 mEq/L (ref 96–112)
Creatinine, Ser: 0.86 mg/dL (ref 0.50–1.10)
GFR calc Af Amer: 83 mL/min — ABNORMAL LOW (ref >90)
GFR calc non Af Amer: 71 mL/min — ABNORMAL LOW (ref >90)
Glucose, Bld: 102 mg/dL — ABNORMAL HIGH (ref 70–99)
Potassium: 3.4 mEq/L — ABNORMAL LOW (ref 3.5–5.1)
Sodium: 140 mEq/L (ref 135–145)

## 2012-05-10 LAB — SURGICAL PCR SCREEN
MRSA, PCR: NEGATIVE
Staphylococcus aureus: NEGATIVE

## 2012-05-10 NOTE — Progress Notes (Signed)
Lynn Johns says that she has to take Losartan every am and has taken prior to surgery in the past.  Lynn Hilliker said she would have to tlak to someone about this.  I left information for Edmonia Caprio to follow up.

## 2012-05-10 NOTE — Pre-Procedure Instructions (Addendum)
20 Lynn Johns  05/10/2012   Your procedure is scheduled on:  Friday June 7  Report to [facility] at 5:30 AM.  Call this number if you have problems the morning of surgery: [phone]   Remember:   Do not eat food:After Midnight.  May have clear liquids: up to 4 Hours before arrival.  Clear liquids include soda, tea, black coffee, apple or grape juice, broth.  Take these medicines the morning of surgery with A SIP OF WATER: Atenolol, may take pain pill   Do not wear jewelry, make-up or nail polish.  Do not wear lotions, powders, or perfumes. You may wear deodorant.  Do not shave 48 hours prior to surgery. Men may shave face and neck.  Do not bring valuables to the hospital.  Contacts, dentures or bridgework may not be worn into surgery.  Leave suitcase in the car. After surgery it may be brought to your room.  For patients admitted to the hospital, checkout time is 11:00 AM the day of discharge.   Patients discharged the day of surgery will not be allowed to drive home.  Name and phone number of your driver: NA  Special Instructions: CHG Shower Use Special Wash: 1/2 bottle night before surgery and 1/2 bottle morning of surgery.   Please read over the following fact sheets that you were given: Pain Booklet, Coughing and Deep Breathing, Blood Transfusion Information and Surgical Site Infection Prevention

## 2012-05-11 NOTE — Consult Note (Addendum)
Anesthesia Chart Review:  Patient is a 61 year old female scheduled for a L3-4 anterior lumbar interbody fusion on 05/19/12.  History includes non-smoker, post-operative N/V, HTN, multiple surgeries including ALIF '11, lumbar discectomy '10, cystocele repair, '07, and vaginal hysterectomy '06.  She had a C5-6 diskectomy in December 2012.               Labs acceptable from an Anesthesia standpoint.  Cr 0.86. K 3.4.  WBC 13.1.  H/H 16.8/48.1.  CXR on 09/08/11 showed no acute chest findings.  EKG on 05/10/12 showed NSR, single PVC, LAD.   She spoke with Anesthesiologist Dr. Noreene Larsson yesterday.  He is suppose to be assigned to her case.  I would like to take both atenolol and losartan/HCTZ on the morning of surgery, as she has "white coat syndrome" and is fearful of her preoperative BP being significantly elevated.  I discussed rationale of holding ARBs and diuretics preoperatively, but she would still like to take these.  Will update Dr. Noreene Larsson.  Shonna Chock, PA-C

## 2012-05-12 ENCOUNTER — Other Ambulatory Visit: Payer: Self-pay

## 2012-05-12 NOTE — Progress Notes (Signed)
Oswaldo Done notified via VM that orders needed for Anterior Approach on Dr. Bosie Helper pt.//L. Aarian Griffie,RN

## 2012-05-12 NOTE — Progress Notes (Signed)
Message left for Lynn Johns  Advising needed order for frontal approach by Dr Arbie Cookey .Marland Kitchen.for this patient .

## 2012-05-16 ENCOUNTER — Other Ambulatory Visit: Payer: Self-pay

## 2012-05-16 NOTE — Progress Notes (Signed)
2nd call made to [facility]  To obtain consent for Dr. Arbie Cookey to do anterior approach for Dr. Danielle Dess. Spoke with Darel Hong.

## 2012-05-18 MED ORDER — CEFAZOLIN SODIUM 1-5 GM-% IV SOLN: 1.0000 g | INTRAVENOUS | Status: DC

## 2012-05-19 ENCOUNTER — Ambulatory Visit (HOSPITAL_COMMUNITY): Payer: BC Managed Care – PPO

## 2012-05-19 ENCOUNTER — Encounter (HOSPITAL_COMMUNITY)
Admission: RE | Disposition: A | Discharge status: Home / Self Care | Payer: Self-pay | Source: Ambulatory Visit | Attending: Neurological Surgery

## 2012-05-19 ENCOUNTER — Ambulatory Visit (HOSPITAL_COMMUNITY): Payer: BC Managed Care – PPO | Admitting: Vascular Surgery

## 2012-05-19 ENCOUNTER — Inpatient Hospital Stay (HOSPITAL_COMMUNITY)
Admission: RE | Admit: 2012-05-19 | Discharge: 2012-05-21 | DRG: 756 | Disposition: A | Discharge status: Home / Self Care | Payer: BC Managed Care – PPO | Source: Ambulatory Visit | Attending: Neurological Surgery | Admitting: Neurological Surgery

## 2012-05-19 ENCOUNTER — Encounter (HOSPITAL_COMMUNITY): Payer: Self-pay | Admitting: Vascular Surgery

## 2012-05-19 ENCOUNTER — Encounter (HOSPITAL_COMMUNITY): Payer: Self-pay | Admitting: *Deleted

## 2012-05-19 DIAGNOSIS — M549 Dorsalgia, unspecified: Secondary | ICD-10-CM

## 2012-05-19 DIAGNOSIS — Z01812 Encounter for preprocedural laboratory examination: Secondary | ICD-10-CM

## 2012-05-19 DIAGNOSIS — Q762 Congenital spondylolisthesis: Secondary | ICD-10-CM

## 2012-05-19 DIAGNOSIS — M47817 Spondylosis without myelopathy or radiculopathy, lumbosacral region: Secondary | ICD-10-CM | POA: Diagnosis present

## 2012-05-19 DIAGNOSIS — M5126 Other intervertebral disc displacement, lumbar region: Principal | ICD-10-CM | POA: Diagnosis present

## 2012-05-19 DIAGNOSIS — Z01810 Encounter for preprocedural cardiovascular examination: Secondary | ICD-10-CM

## 2012-05-19 DIAGNOSIS — I10 Essential (primary) hypertension: Secondary | ICD-10-CM | POA: Diagnosis present

## 2012-05-19 HISTORY — PX: ANTERIOR LUMBAR FUSION: SHX1170

## 2012-05-19 SURGERY — ANTERIOR LUMBAR FUSION 1 LEVEL
Anesthesia: General | Site: Spine Lumbar | Wound class: Clean

## 2012-05-19 MED ORDER — METHOCARBAMOL 100 MG/ML IJ SOLN
500.0000 mg | Freq: Four times a day (QID) | INTRAVENOUS | Status: DC | PRN
  Filled 2012-05-19: qty 5

## 2012-05-19 MED ORDER — HYDROCODONE-ACETAMINOPHEN 5-325 MG PO TABS
1.0000 | ORAL_TABLET | ORAL | Status: DC | PRN
  Administered 2012-05-19 – 2012-05-20 (×4): 1 via ORAL
  Filled 2012-05-19 (×4): qty 1

## 2012-05-19 MED ORDER — PROPOFOL 10 MG/ML IV EMUL
INTRAVENOUS | Status: DC | PRN
  Administered 2012-05-19: 160 mg via INTRAVENOUS

## 2012-05-19 MED ORDER — LOSARTAN POTASSIUM 50 MG PO TABS
100.0000 mg | ORAL_TABLET | Freq: Every day | ORAL | Status: DC
  Administered 2012-05-21: 100 mg via ORAL
  Filled 2012-05-19 (×2): qty 2

## 2012-05-19 MED ORDER — PHENOL 1.4 % MT LIQD: 1.0000 | OROMUCOSAL | Status: DC | PRN

## 2012-05-19 MED ORDER — ONDANSETRON HCL 4 MG/2ML IJ SOLN: 4.0000 mg | Freq: Once | INTRAMUSCULAR | Status: DC | PRN

## 2012-05-19 MED ORDER — CEFAZOLIN SODIUM 1-5 GM-% IV SOLN
INTRAVENOUS | Status: AC
  Filled 2012-05-19: qty 50

## 2012-05-19 MED ORDER — GLYCOPYRROLATE 0.2 MG/ML IJ SOLN
INTRAMUSCULAR | Status: DC | PRN
  Administered 2012-05-19: .6 mg via INTRAVENOUS

## 2012-05-19 MED ORDER — ATENOLOL 100 MG PO TABS
100.0000 mg | ORAL_TABLET | Freq: Every day | ORAL | Status: DC
  Administered 2012-05-21: 100 mg via ORAL
  Filled 2012-05-19 (×2): qty 1

## 2012-05-19 MED ORDER — CEFAZOLIN SODIUM 1-5 GM-% IV SOLN
1.0000 g | Freq: Three times a day (TID) | INTRAVENOUS | Status: AC
  Administered 2012-05-19 – 2012-05-20 (×2): 1 g via INTRAVENOUS
  Filled 2012-05-19 (×2): qty 50

## 2012-05-19 MED ORDER — SODIUM CHLORIDE 0.9 % IR SOLN
Status: DC | PRN
  Administered 2012-05-19: 08:00:00

## 2012-05-19 MED ORDER — ONDANSETRON HCL 4 MG/2ML IJ SOLN: 4.0000 mg | INTRAMUSCULAR | Status: DC | PRN

## 2012-05-19 MED ORDER — ACETAMINOPHEN 10 MG/ML IV SOLN: 1000.0000 mg | Freq: Once | INTRAVENOUS | Status: DC | PRN

## 2012-05-19 MED ORDER — LACTATED RINGERS IV SOLN
INTRAVENOUS | Status: DC | PRN
  Administered 2012-05-19 (×3): via INTRAVENOUS

## 2012-05-19 MED ORDER — SUFENTANIL CITRATE 50 MCG/ML IV SOLN
INTRAVENOUS | Status: DC | PRN
  Administered 2012-05-19: 30 ug via INTRAVENOUS

## 2012-05-19 MED ORDER — ENOXAPARIN SODIUM 40 MG/0.4ML ~~LOC~~ SOLN
40.0000 mg | SUBCUTANEOUS | Status: DC
  Administered 2012-05-20 – 2012-05-21 (×2): 40 mg via SUBCUTANEOUS
  Filled 2012-05-19 (×3): qty 0.4

## 2012-05-19 MED ORDER — SODIUM CHLORIDE 0.9 % IV SOLN
INTRAVENOUS | Status: AC
  Filled 2012-05-19: qty 500

## 2012-05-19 MED ORDER — SODIUM CHLORIDE 0.9 % IJ SOLN: 3.0000 mL | INTRAMUSCULAR | Status: DC | PRN

## 2012-05-19 MED ORDER — CEFAZOLIN SODIUM 1-5 GM-% IV SOLN
INTRAVENOUS | Status: DC | PRN
  Administered 2012-05-19 (×2): 1 g via INTRAVENOUS

## 2012-05-19 MED ORDER — SODIUM CHLORIDE 0.9 % IV SOLN: 250.0000 mL | INTRAVENOUS | Status: DC

## 2012-05-19 MED ORDER — SODIUM CHLORIDE 0.9 % IV SOLN
INTRAVENOUS | Status: AC
  Administered 2012-05-19: 13:00:00 via INTRAVENOUS

## 2012-05-19 MED ORDER — NEOSTIGMINE METHYLSULFATE 1 MG/ML IJ SOLN
INTRAMUSCULAR | Status: DC | PRN
  Administered 2012-05-19: 4 mg via INTRAVENOUS

## 2012-05-19 MED ORDER — SODIUM CHLORIDE 0.9 % IV SOLN
0.4000 ug/kg/h | Freq: Once | INTRAVENOUS | Status: DC
  Filled 2012-05-19: qty 2

## 2012-05-19 MED ORDER — ALUM & MAG HYDROXIDE-SIMETH 200-200-20 MG/5ML PO SUSP: 30.0000 mL | Freq: Four times a day (QID) | ORAL | Status: DC | PRN

## 2012-05-19 MED ORDER — ACETAMINOPHEN 325 MG PO TABS: 650.0000 mg | ORAL_TABLET | ORAL | Status: DC | PRN

## 2012-05-19 MED ORDER — ROCURONIUM BROMIDE 100 MG/10ML IV SOLN
INTRAVENOUS | Status: DC | PRN
  Administered 2012-05-19: 50 mg via INTRAVENOUS
  Administered 2012-05-19: 10 mg via INTRAVENOUS

## 2012-05-19 MED ORDER — ACETAMINOPHEN 650 MG RE SUPP: 650.0000 mg | RECTAL | Status: DC | PRN

## 2012-05-19 MED ORDER — BACITRACIN 50000 UNITS IM SOLR
INTRAMUSCULAR | Status: AC
  Filled 2012-05-19: qty 1

## 2012-05-19 MED ORDER — SCOPOLAMINE 1 MG/3DAYS TD PT72
MEDICATED_PATCH | TRANSDERMAL | Status: DC | PRN
  Administered 2012-05-19: 1 via TRANSDERMAL

## 2012-05-19 MED ORDER — HYDROMORPHONE HCL PF 1 MG/ML IJ SOLN
0.2500 mg | INTRAMUSCULAR | Status: DC | PRN
  Administered 2012-05-19: 0.5 mg via INTRAVENOUS

## 2012-05-19 MED ORDER — DEXAMETHASONE SODIUM PHOSPHATE 4 MG/ML IJ SOLN
INTRAMUSCULAR | Status: DC | PRN
  Administered 2012-05-19: 4 mg via INTRAVENOUS

## 2012-05-19 MED ORDER — HYDROCHLOROTHIAZIDE 12.5 MG PO CAPS
12.5000 mg | ORAL_CAPSULE | Freq: Every day | ORAL | Status: DC
  Administered 2012-05-21: 12.5 mg via ORAL
  Filled 2012-05-19 (×2): qty 1

## 2012-05-19 MED ORDER — MORPHINE SULFATE 2 MG/ML IJ SOLN: 1.0000 mg | INTRAMUSCULAR | Status: DC | PRN

## 2012-05-19 MED ORDER — 0.9 % SODIUM CHLORIDE (POUR BTL) OPTIME
TOPICAL | Status: DC | PRN
  Administered 2012-05-19: 1000 mL

## 2012-05-19 MED ORDER — ONDANSETRON HCL 4 MG/2ML IJ SOLN
INTRAMUSCULAR | Status: DC | PRN
  Administered 2012-05-19 (×2): 4 mg via INTRAVENOUS

## 2012-05-19 MED ORDER — EPHEDRINE SULFATE 50 MG/ML IJ SOLN
INTRAMUSCULAR | Status: DC | PRN
  Administered 2012-05-19 (×2): 5 mg via INTRAVENOUS

## 2012-05-19 MED ORDER — LOSARTAN POTASSIUM-HCTZ 100-12.5 MG PO TABS: 1.0000 | ORAL_TABLET | Freq: Every day | ORAL | Status: DC

## 2012-05-19 MED ORDER — SODIUM CHLORIDE 0.9 % IJ SOLN
3.0000 mL | Freq: Two times a day (BID) | INTRAMUSCULAR | Status: DC
  Administered 2012-05-20: 3 mL via INTRAVENOUS

## 2012-05-19 MED ORDER — HYDROMORPHONE HCL PF 1 MG/ML IJ SOLN
INTRAMUSCULAR | Status: AC
  Filled 2012-05-19: qty 1

## 2012-05-19 MED ORDER — MENTHOL 3 MG MT LOZG: 1.0000 | LOZENGE | OROMUCOSAL | Status: DC | PRN

## 2012-05-19 MED ORDER — PHENYLEPHRINE HCL 10 MG/ML IJ SOLN
INTRAMUSCULAR | Status: DC | PRN
  Administered 2012-05-19: 80 ug via INTRAVENOUS
  Administered 2012-05-19: 40 ug via INTRAVENOUS
  Administered 2012-05-19 (×2): 80 ug via INTRAVENOUS
  Administered 2012-05-19: 40 ug via INTRAVENOUS
  Administered 2012-05-19: 80 ug via INTRAVENOUS
  Administered 2012-05-19: 40 ug via INTRAVENOUS
  Administered 2012-05-19: 80 ug via INTRAVENOUS

## 2012-05-19 MED ORDER — CYCLOBENZAPRINE HCL 10 MG PO TABS: 10.0000 mg | ORAL_TABLET | Freq: Three times a day (TID) | ORAL | Status: DC | PRN

## 2012-05-19 MED ORDER — METHOCARBAMOL 500 MG PO TABS
500.0000 mg | ORAL_TABLET | Freq: Four times a day (QID) | ORAL | Status: DC | PRN
  Administered 2012-05-19 – 2012-05-21 (×5): 500 mg via ORAL
  Filled 2012-05-19 (×5): qty 1

## 2012-05-19 MED ORDER — SODIUM CHLORIDE 0.9 % IV SOLN
200.0000 ug | INTRAVENOUS | Status: DC | PRN
  Administered 2012-05-19: 0.4 ug/kg/h via INTRAVENOUS

## 2012-05-19 MED ORDER — THROMBIN 20000 UNITS EX KIT
PACK | CUTANEOUS | Status: DC | PRN
  Administered 2012-05-19: 09:00:00 via TOPICAL

## 2012-05-19 MED ORDER — HEMOSTATIC AGENTS (NO CHARGE) OPTIME
TOPICAL | Status: DC | PRN
  Administered 2012-05-19: 1 via TOPICAL

## 2012-05-19 SURGICAL SUPPLY — 97 items
ADH SKN CLS APL DERMABOND .7 (GAUZE/BANDAGES/DRESSINGS)
ADH SKN CLS LQ APL DERMABOND (GAUZE/BANDAGES/DRESSINGS) ×1
APPLIER CLIP 11 MED OPEN (CLIP) ×2
APR CLP MED 11 20 MLT OPN (CLIP) ×1
BAG DECANTER FOR FLEXI CONT (MISCELLANEOUS) ×2 IMPLANT
BONE MARROW ASPIRATION 11GUAGE/15CM NEEDLE WITH SDIE HOLES ×2 IMPLANT
BUR BARREL STRAIGHT FLUTE 4.0 (BURR) ×2 IMPLANT
BUR MATCHSTICK NEURO 3.0 LAGG (BURR) ×2 IMPLANT
CANISTER SUCTION 2500CC (MISCELLANEOUS) ×2 IMPLANT
CLIP APPLIE 11 MED OPEN (CLIP) ×1 IMPLANT
CLOTH BEACON ORANGE TIMEOUT ST (SAFETY) ×4 IMPLANT
CONT SPEC 4OZ CLIKSEAL STRL BL (MISCELLANEOUS) ×2 IMPLANT
CORDS BIPOLAR (ELECTRODE) ×2 IMPLANT
COVER BACK TABLE 24X17X13 BIG (DRAPES) IMPLANT
COVER TABLE BACK 60X90 (DRAPES) ×2 IMPLANT
CUBE CONFORM 17MM (Orthopedic Implant) ×4 IMPLANT
DECANTER SPIKE VIAL GLASS SM (MISCELLANEOUS) ×2 IMPLANT
DERMABOND ADHESIVE PROPEN (GAUZE/BANDAGES/DRESSINGS) ×1
DERMABOND ADVANCED (GAUZE/BANDAGES/DRESSINGS)
DERMABOND ADVANCED .7 DNX12 (GAUZE/BANDAGES/DRESSINGS) IMPLANT
DERMABOND ADVANCED .7 DNX6 (GAUZE/BANDAGES/DRESSINGS) ×1 IMPLANT
DRAPE C-ARM 42X72 X-RAY (DRAPES) ×4 IMPLANT
DRAPE INCISE IOBAN 66X45 STRL (DRAPES) IMPLANT
DRAPE LAPAROTOMY 100X72X124 (DRAPES) ×2 IMPLANT
DRAPE POUCH INSTRU U-SHP 10X18 (DRAPES) ×2 IMPLANT
DURAPREP 26ML APPLICATOR (WOUND CARE) ×2 IMPLANT
ELECT BLADE 4.0 EZ CLEAN MEGAD (MISCELLANEOUS) ×2
ELECT REM PT RETURN 9FT ADLT (ELECTROSURGICAL) ×2
ELECTRODE BLDE 4.0 EZ CLN MEGD (MISCELLANEOUS) ×1 IMPLANT
ELECTRODE REM PT RTRN 9FT ADLT (ELECTROSURGICAL) ×1 IMPLANT
GAUZE SPONGE 4X4 16PLY XRAY LF (GAUZE/BANDAGES/DRESSINGS) IMPLANT
GLOVE BIO SURGEON STRL SZ7.5 (GLOVE) IMPLANT
GLOVE BIO SURGEON STRL SZ8 (GLOVE) ×2 IMPLANT
GLOVE BIOGEL PI IND STRL 7.5 (GLOVE) IMPLANT
GLOVE BIOGEL PI IND STRL 8.5 (GLOVE) ×1 IMPLANT
GLOVE BIOGEL PI INDICATOR 7.5 (GLOVE)
GLOVE BIOGEL PI INDICATOR 8.5 (GLOVE) ×1
GLOVE ECLIPSE 8.5 STRL (GLOVE) ×2 IMPLANT
GLOVE EXAM NITRILE LRG STRL (GLOVE) IMPLANT
GLOVE EXAM NITRILE MD LF STRL (GLOVE) ×2 IMPLANT
GLOVE EXAM NITRILE XL STR (GLOVE) IMPLANT
GLOVE EXAM NITRILE XS STR PU (GLOVE) IMPLANT
GLOVE OPTIFIT SS 7.5 STRL LX (GLOVE) ×2 IMPLANT
GLOVE SS BIOGEL STRL SZ 7.5 (GLOVE) ×1 IMPLANT
GLOVE SS N UNI LF 7.5 STRL (GLOVE) IMPLANT
GLOVE SUPERSENSE BIOGEL SZ 7.5 (GLOVE) ×1
GOWN BRE IMP SLV AUR LG STRL (GOWN DISPOSABLE) IMPLANT
GOWN BRE IMP SLV AUR XL STRL (GOWN DISPOSABLE) ×4 IMPLANT
GOWN STRL NON-REIN LRG LVL3 (GOWN DISPOSABLE) ×2 IMPLANT
GOWN STRL REIN 2XL LVL4 (GOWN DISPOSABLE) ×4 IMPLANT
INSERT FOGARTY 61MM (MISCELLANEOUS) IMPLANT
INSERT FOGARTY SM (MISCELLANEOUS) IMPLANT
KIT BASIN OR (CUSTOM PROCEDURE TRAY) ×2 IMPLANT
KIT ROOM TURNOVER OR (KITS) ×4 IMPLANT
LOOP VESSEL MAXI BLUE (MISCELLANEOUS) IMPLANT
LOOP VESSEL MINI RED (MISCELLANEOUS) IMPLANT
NEEDLE HYPO 25X1 1.5 SAFETY (NEEDLE) ×2 IMPLANT
NEEDLE SPNL 18GX3.5 QUINCKE PK (NEEDLE) IMPLANT
NS IRRIG 1000ML POUR BTL (IV SOLUTION) ×2 IMPLANT
PACK LAMINECTOMY NEURO (CUSTOM PROCEDURE TRAY) ×2 IMPLANT
PAD ARMBOARD 7.5X6 YLW CONV (MISCELLANEOUS) ×8 IMPLANT
SCREW 20MM (Screw) ×8 IMPLANT
SPONGE INTESTINAL PEANUT (DISPOSABLE) ×4 IMPLANT
SPONGE LAP 18X18 X RAY DECT (DISPOSABLE) ×2 IMPLANT
SPONGE LAP 4X18 X RAY DECT (DISPOSABLE) IMPLANT
SPONGE SURGIFOAM ABS GEL 100 (HEMOSTASIS) ×2 IMPLANT
STAPLER VISISTAT 35W (STAPLE) IMPLANT
SUT MNCRL AB 4-0 PS2 18 (SUTURE) IMPLANT
SUT PROLENE 4 0 RB 1 (SUTURE) ×2
SUT PROLENE 4-0 RB1 .5 CRCL 36 (SUTURE) ×1 IMPLANT
SUT PROLENE 5 0 CC1 (SUTURE) IMPLANT
SUT PROLENE 6 0 C 1 30 (SUTURE) IMPLANT
SUT PROLENE 6 0 CC (SUTURE) IMPLANT
SUT SILK 0 TIES 10X30 (SUTURE) IMPLANT
SUT SILK 2 0 TIES 10X30 (SUTURE) ×2 IMPLANT
SUT SILK 2 0SH CR/8 30 (SUTURE) IMPLANT
SUT SILK 3 0 TIES 10X30 (SUTURE) IMPLANT
SUT SILK 3 0SH CR/8 30 (SUTURE) IMPLANT
SUT VIC AB 0 CT1 27 (SUTURE) ×4
SUT VIC AB 0 CT1 27XBRD ANBCTR (SUTURE) ×2 IMPLANT
SUT VIC AB 1 CT1 18XBRD ANBCTR (SUTURE) IMPLANT
SUT VIC AB 1 CT1 8-18 (SUTURE)
SUT VIC AB 2-0 CP2 18 (SUTURE) IMPLANT
SUT VIC AB 2-0 CT1 36 (SUTURE) ×2 IMPLANT
SUT VIC AB 3-0 SH 27 (SUTURE) ×2
SUT VIC AB 3-0 SH 27X BRD (SUTURE) ×2 IMPLANT
SUT VIC AB 3-0 SH 8-18 (SUTURE) IMPLANT
SUT VICRYL 4-0 PS2 18IN ABS (SUTURE) IMPLANT
SYNFIX LR 26X32 13.5X8 (Orthopedic Implant) ×2 IMPLANT
SYR 20ML ECCENTRIC (SYRINGE) ×2 IMPLANT
SYR 30ML SLIP (SYRINGE) ×2 IMPLANT
SYR CONTROL 10ML LL (SYRINGE) ×2 IMPLANT
TOWEL OR 17X24 6PK STRL BLUE (TOWEL DISPOSABLE) ×8 IMPLANT
TOWEL OR 17X26 10 PK STRL BLUE (TOWEL DISPOSABLE) ×4 IMPLANT
TRAP SPECIMEN MUCOUS 40CC (MISCELLANEOUS) IMPLANT
TRAY FOLEY CATH 14FRSI W/METER (CATHETERS) ×2 IMPLANT
WATER STERILE IRR 1000ML POUR (IV SOLUTION) ×2 IMPLANT

## 2012-05-19 NOTE — Preoperative (Signed)
Beta Blockers   Reason not to administer Beta Blockers:Not Applicable 

## 2012-05-19 NOTE — H&P (Signed)
Lynn Johns   04/18/2012 3:00 PM Office Visit  MRN: [id number]   Description: 61 year old female  Provider: Yaneliz Radebaugh, MD  Department: [facility]        Diagnoses     Degeneration of intervertebral disc, site unspecified   - Primary    722.6      Reason for Visit     New Evaluation    consult for ALIF requested by Dr. Danielle Dess        Vitals - Last Recorded       BP Pulse Resp Ht Wt BMI    138/89  95  18  5\' 5"  (1.651 m)  155 lb 6.4 oz (70.489 kg)  25.86 kg/m2       Progress Notes     Lynn Biever, MD  04/18/2012  3:53 PM  Signed The patient presents today for discussion of anterior exposure for L3-L4 discectomy. She underwent a prior uneventful anterior approach for L5-S1 disc repair with Dr. Danielle Dess. As was 2 years ago. She has now had a new L3-4 disc disease. He has been better recommended her to undergo anterior approach and she is seeing me for preoperative discussion of this. He does not have any history of peripheral vascular occlusive disease. She does not have any history of cardiac disease.     Past Medical History   Diagnosis  Date   .  Cystocele     .  Hypertension         History   Substance Use Topics   .  Smoking status:  Never Smoker    .  Smokeless tobacco:  Not on file   .  Alcohol Use:  Yes         1 glass of wine weekly       Family History   Problem  Relation  Age of Onset   .  Heart disease  Father     .  Hypertension  Father         Allergies   Allergen  Reactions   .  Fentanyl         Rash, insomnia, uncontrollable crying   .  Versed (Midazolam)         Rash, insomnia, memory loss      Current outpatient prescriptions:atenolol (TENORMIN) 100 MG tablet, Take 100 mg by mouth daily., Disp: , Rfl: ;  losartan-hydrochlorothiazide (HYZAAR) 100-12.5 MG per tablet, Take 1 tablet by mouth daily., Disp: , Rfl: ;  lisinopril-hydrochlorothiazide (PRINZIDE,ZESTORETIC) 10-12.5 MG per tablet, Take 1 tablet by mouth daily., Disp: , Rfl:     BP 138/89  Pulse 95  Resp 18  Ht 5\' 5"  (1.651 m)  Wt 155 lb 6.4 oz (70.489 kg)  BMI 25.86 kg/m2   Body mass index is 25.86 kg/(m^2).             Review of systems is totally negative aside from above   Physical exam: Well-developed well nourished white female no acute distress. 2+ radial 2+ femoral and 2+ popliteal pulses bilaterally. She does have a prior left transverse lower abdominal incision from her prior L5-S1 surgery. No evidence of abdominal hernias and no evidence of abdominal masses.   Impression and plan: L3-4 disc disease. I discussed my role in the exposure. I did explain the potential for scarring with her prior surgery. I explained mobilization of intraperitoneal contents, left ureter, and aortoiliac arterial and venous segments. The potential injury of all these. Patient understands and wished  to proceed with surgery which is scheduled for 05/19/2012     Electronic signature on 04/18/2012      Addendum:  The patient has been re-examined and re-evaluated.  The patient's history and physical has been reviewed and is unchanged.    Lynn Johns is a 61 y.o. female is being admitted with Lumbar hnp without myelopathy, Lumbar radiculopathy, Lumbar spondylosis . All the risks, benefits and other treatment options have been discussed with the patient. The patient has consented to proceed with Procedure(s): ANTERIOR LUMBAR FUSION 1 LEVEL ABDOMINAL EXPOSURE as a surgical intervention.  Lynn Johns 05/19/2012 7:39 AM [facility]

## 2012-05-19 NOTE — Anesthesia Postprocedure Evaluation (Signed)
  Anesthesia Post-op Note  Patient: Lynn Johns  Procedure(s) Performed: Procedure(s) (LRB): ANTERIOR LUMBAR FUSION 1 LEVEL (N/A) ABDOMINAL EXPOSURE (N/A)  Patient Location: PACU  Anesthesia Type: General  Level of Consciousness: awake, alert  and oriented  Airway and Oxygen Therapy: Patient Spontanous Breathing  Post-op Pain: mild  Post-op Assessment: Post-op Vital signs reviewed and Patient's Cardiovascular Status Stable  Post-op Vital Signs: stable  Complications: No apparent anesthesia complications

## 2012-05-19 NOTE — Op Note (Signed)
OPERATIVE REPORT  DATE OF SURGERY: 05/19/2012  PATIENT: Lynn Johns, 61 y.o. female MRN: [id number]  DOB: Sep 10, 1951  PRE-OPERATIVE DIAGNOSIS: L3-L4 lumbar disc disease  POST-OPERATIVE DIAGNOSIS:  Same  PROCEDURE: Anterior exposure for lumbar interbody fusion L3-L4  CO-SURGEON for the exposure:  Gretta Began, M.D., Barnett Abu, M.D.    ANESTHESIA:  Gen.    Total I/O In: 3200 [I.V.:3200] Out: 675 [Urine:675]  BLOOD ADMINISTERED: None  DRAINS: None    COUNTS CORRECT:  YES  PLAN OF CARE: PACU   PATIENT DISPOSITION:  PACU - hemodynamically stable  PROCEDURE DETAILS: The patient was taken to the operating room and placed supine position where the area of the abdomen was prepped and draped in the usual sterile fashion AP and lateral C-arm projections were used to identify the level of the L3-L4 disc on the patient's abdomen. This was at the level of the umbilicus. A transverse incision was made just to the left lateral edge of the umbilicus and extended to the lateral edge of the rectus muscle. Subcutaneous fat was mobilized and the rectus sheath was identified. The anterior rectus sheath was opened with electrocautery in line with the skin incision. The rectus muscle was mobilized circumferentially several centimeters proximal and distal to the rectus incision. The posterior rectus sheath was opened sharply at the extreme lateral aspect of the posterior rectus sheath taking care not to enter the peritoneal space. The peritoneum was mobilized from the underside of the posterior rectus sheath and the posterior rectus sheath was opened in line with the skin incision. Blunt dissection was used to mobilize the peritoneal contents again taking care not to enter the peritoneal space. The retroperitoneum was entered bluntly above the level of the psoas muscle and dissection was continued towards the right. A single lumbar vein and lumbar artery were divided for better mobilization. The aorta  was mobilized to the right. The ureter was identified and was also mobilized to the right. The brow retractor was brought onto the field. The 150 cm reversed blades were positioned to the right and left of the L3-L4 disc. The 140 malleable blades were positioned superiorly and inferiorly for exposure. C-arm again was used to identify this was the level of the L3-L4 disc. The remainder of the procedure will be dictated as a separate note by Dr. Malcolm Metro, M.D. 05/19/2012 1:33 PM

## 2012-05-19 NOTE — H&P (Signed)
Lynn Johns is an 61 y.o. female.   Chief Complaint: Mid lumbar back pain with numbness in both toes radicular pain in both thighs. Symptoms ongoing for 8 months without response to conservative management HPI:    Lynn Johns has been having difficulty with lumbar radicular pain, in addition to centralized back pain secondary to degenerative changes at the L3-L4 level.  We are planning an anterior lumbar decompression and arthrodesis at the level of L3-L4.  However, approval for the surgery has not yet been given.  Lynn Johns tells me that she is having increasing symptoms of numbness and dysesthesias and more recently cramping in the lower extremities, particularly in the distal lower extremities in her toes and the bottoms of her feet.  She has had to manually break these cramps and massage has helped to alleviate them.  She has taken some muscle relaxer in the form of Robaxin which has helped to lessen their severity.  Nonetheless, she is still having substantial problems with back pain.  She finds that she can't sit for more than a few minutes at a time and she finds that standing is a more comfortable position albeit with time this tends to tire her back further.    I obtained flexion and extension radiographs of her lumbar spine in today's office visit.  The radiographs demonstrate that she has approximately 3 mm. of anterolisthesis at L3 on L4 in flexion vs. extension.  This is a new finding as I had not had any motion films of her lumbar spine previously.  I noted that this definitely suggests an area of instability in her lumbar spine.  L4-L5 appears to show normal mobility and appears healthy.  Her arthrodesis at L5-S1 is stable.     I discussed this with Lynn Johns today.  I believe that she does, indeed, have elements of instability in her lumbar spine that will require stabilization.  Her history was such that at L5-S1 we did a simple decompression for lumbar radiculopathy secondary to her  degenerated disc at that level and subsequently in 7 months she required the arthrodesis.  My concern is that the same process is likely to evolve here.  Simple decompression at L3-L4 is likely to yield further instability and will ultimately require that her back be fused.  The plan would be to do a singular procedure as an anterior lumbar interbody arthrodesis to decompress the disc space at L3-4 and save her the additional surgery.  We will see if we can obtain approval for this in the earliest convenience.   Past Medical History  Diagnosis Date  . Cystocele   . Hypertension   . PONV (postoperative nausea and vomiting)     needs motion sickness patch prior to surgery    Past Surgical History  Procedure Date  . Tubal ligation 1986  . Abdominal hysterectomy July 2006    with bilateral salpingo-oophorectomy  . Rotator cuff surgery     right and left   . Anterior lumbar decompression and arthrodesis     L5-S1  . Cervical disc removal 11-2011    C5 and C6    Family History  Problem Relation Age of Onset  . Heart disease Father   . Hypertension Father    Social History:  reports that she has never smoked. She does not have any smokeless tobacco history on file. She reports that she drinks about .6 ounces of alcohol per week. She reports that she does not use illicit drugs.  Allergies:  Allergies  Allergen Reactions  . Fentanyl     Rash, insomnia, uncontrollable crying  . Versed (Midazolam)     Rash, insomnia, memory loss    Medications Prior to Admission  Medication Sig Dispense Refill  . atenolol (TENORMIN) 100 MG tablet Take 100 mg by mouth daily.      Marland Kitchen HYDROcodone-acetaminophen (NORCO) 5-325 MG per tablet Take 1 tablet by mouth every 4 (four) hours as needed.      Marland Kitchen losartan-hydrochlorothiazide (HYZAAR) 100-12.5 MG per tablet Take 1 tablet by mouth daily.        No results found for this or any previous visit (from the past 48 hour(s)). No results found.  Review of  Systems  Constitutional:       Proximal leg strength diminishing fatigue with prolonged ambulation  HENT: Negative.   Eyes: Negative.   Respiratory: Negative.   Gastrointestinal: Negative.   Genitourinary: Negative.   Musculoskeletal: Positive for back pain.       Pain and proximal lower extremities  Skin: Negative.   Neurological: Positive for tingling, sensory change and weakness. Tremors: prolonged ambulation.  Endo/Heme/Allergies: Negative.   Psychiatric/Behavioral: Negative.     Blood pressure 123/88, pulse 82, temperature 98.1 F (36.7 C), temperature source Oral, resp. rate 18, weight 69.4 kg (153 lb), SpO2 98.00%. Physical Exam  Constitutional: She appears well-developed and well-nourished.  HENT:  Head: Normocephalic and atraumatic.  Eyes: Conjunctivae and EOM are normal. Pupils are equal, round, and reactive to light.  Neck: Normal range of motion. Neck supple.  Cardiovascular: Normal rate, regular rhythm and normal heart sounds.   Respiratory: Effort normal and breath sounds normal.  GI: Soft. Bowel sounds are normal.  Musculoskeletal: Normal range of motion.  Neurological: She is alert.       Decreased patellar reflexes bilaterally ,positive straight leg raising at 45 bilaterally  Skin: Skin is warm and dry.  Psychiatric: Her behavior is normal. Judgment and thought content normal.     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  Plan anterior lumbar decompression L3-L4 arthrodesis with peek spacer local autograft and allograft  Lynn Johns 05/19/2012, 7:44 AM

## 2012-05-19 NOTE — Plan of Care (Signed)
Problem: Consults Goal: Diagnosis - Spinal Surgery Outcome: Completed/Met Date Met:  05/19/12 Thoraco/Lumbar Spine Fusion  (ALIF L3-4 with abdominal exposure)

## 2012-05-19 NOTE — Anesthesia Procedure Notes (Signed)
Procedure Name: Intubation Date/Time: 05/19/2012 8:08 AM Performed by: Glendora Score A Pre-anesthesia Checklist: Patient identified, Emergency Drugs available, Suction available and Patient being monitored Patient Re-evaluated:Patient Re-evaluated prior to inductionOxygen Delivery Method: Circle system utilized Preoxygenation: Pre-oxygenation with 100% oxygen Intubation Type: IV induction Ventilation: Mask ventilation without difficulty Laryngoscope Size: Miller and 2 Grade View: Grade I Tube type: Oral Tube size: 7.5 mm Number of attempts: 1 Airway Equipment and Method: Stylet Placement Confirmation: ETT inserted through vocal cords under direct vision,  positive ETCO2 and breath sounds checked- equal and bilateral Secured at: 21 cm Tube secured with: Tape Dental Injury: Teeth and Oropharynx as per pre-operative assessment

## 2012-05-19 NOTE — Op Note (Signed)
Preoperative diagnosis herniated nucleus pulposus L3-L4 with radiculopathy, instability Postoperative diagnosis: Herniated nucleus pulposus L3-L4 with radiculopathy, instability, spondylolisthesis Procedure: Anterior lumbar decompression of herniated nucleus pulposus L3-L4 arthrodesis with peek spacer and anterior plate fixation [id number], allograft, bone marrow aspirate Surgeon: Barnett Abu M.D. assistant: Maeola Harman M.D.  approach: Dr. Tawanna Cooler early M.D. Indications: Lynn Johns to 61 year old individual who previously had a herniated nucleus pulposus L5-S1 she did well with anterior decompression arthrodesis. In the last year she's developed increasing back pain with radicular pain in the L3 distribution. She was found to have a centrally herniated disc at L3-L4. She is failed all manner of conservative management. Flexion-extension films demonstrate mild spondylolisthesis at the L3-L4 level. Is now been advised regarding the need for surgical decompression and stabilization of L3-L4.  Procedure: After Dr. Tawanna Cooler early had performed anterior retroperitoneal dissection and isolation of the L3-L4 space, I started the decompression by opening the anterior longitudinal ligament at L3-L4 after verifying this level radiographically. A 15 blade was used to open the anterior longitudinal ligament and a series of curettes and rongeurs were used to evacuate a significant quantity of severely degenerated and desiccated disc material. The exposure was maintained with the use of a Thompson retractor and brow blades. The disc space was then evacuated fully and the endplates were curettaged to remove all the remnants of the articular cartilage. All the endplate cartilage was also removed. A high-speed drill was used to decorticate the endplates also. A complete discectomy was finished the interspace was sized for appropriate size spacer. A 28 by 24 mm spacer measuring 13-1/2 mm in height and having 8 of lordosis was chosen.  A  bone marrow aspirate was obtained from the L4 vertebral body using a Jamshidi needle. 10 cc of bone marrow aspirate was obtained.  Conform Cube allograft was soaked with a bone marrow aspirate. Two 17 mm allograft cubes were used. The cubes were cut to the appropriate size and fit into the peek spacer. The spacer was then placed into the L3-4 interspace using the squared peek spacer applier. Screws were then placed into the anterior plate measuring 20 mm in length 2 superiorly placed screws were placed in L3 to inferiorly placed screws in L4. Final radiographs identified good position of the hardware. Some additional allograft conform tube was placed into the lateral gutters. The area was inspected carefully and checked for hemostasis.  The retractors were then removed. The surgical site was checked with final radiographs. The anterior rectus sheath was then closed with 0 Vicryl in a running fashion. 2-0 Vicryl was used in the subcutaneous tissues. 3-0 Vicryl was used in the deep subcuticular layer and a 4-0 Monocryl was used to close the subcuticular tissues Dermabond was placed on the skin blood loss was estimated at less than 100 cc.

## 2012-05-19 NOTE — Transfer of Care (Signed)
Immediate Anesthesia Transfer of Care Note  Patient: Lynn Johns  Procedure(s) Performed: Procedure(s) (LRB): ANTERIOR LUMBAR FUSION 1 LEVEL (N/A) ABDOMINAL EXPOSURE (N/A)  Patient Location: PACU  Anesthesia Type: General  Level of Consciousness: awake, alert , oriented and patient cooperative  Airway & Oxygen Therapy: Patient Spontanous Breathing and Patient connected to face mask oxygen  Post-op Assessment: Report given to PACU RN  Post vital signs: Reviewed and stable  Complications: No apparent anesthesia complications

## 2012-05-19 NOTE — Progress Notes (Signed)
Patient ID: Lynn Johns, female   DOB: 01/27/1951, 61 y.o.   MRN: [id number] Alert postoperative. Offers no significant complaints of pain. Motor function stable. Patient notes less numbness in feet.  Incision is clean and dry.  Plan observe patient

## 2012-05-19 NOTE — Progress Notes (Signed)
UR COMPLETED  

## 2012-05-19 NOTE — Anesthesia Preprocedure Evaluation (Addendum)
Anesthesia Evaluation  Patient identified by MRN, date of birth, ID band Patient awake    Reviewed: Allergy & Precautions, H&P , NPO status , Patient's Chart, lab work & pertinent test results, reviewed documented beta blocker date and time   History of Anesthesia Complications (+) PONV  Airway Mallampati: II      Dental  (+) Teeth Intact   Pulmonary  breath sounds clear to auscultation        Cardiovascular hypertension, Pt. on medications Rhythm:Regular Rate:Normal     Neuro/Psych    GI/Hepatic   Endo/Other    Renal/GU      Musculoskeletal   Abdominal   Peds  Hematology   Anesthesia Other Findings   Reproductive/Obstetrics                          Anesthesia Physical Anesthesia Plan  ASA: III  Anesthesia Plan: General   Post-op Pain Management:    Induction: Intravenous  Airway Management Planned: Oral ETT  Additional Equipment:   Intra-op Plan:   Post-operative Plan:   Informed Consent: I have reviewed the patients History and Physical, chart, labs and discussed the procedure including the risks, benefits and alternatives for the proposed anesthesia with the patient or authorized representative who has indicated his/her understanding and acceptance.   Dental advisory given  Plan Discussed with:   Anesthesia Plan Comments: (HNP L3-4 Htn Anxiety H/O Post-op N?V  Plan GA with art line  Kipp Brood, MD)        Anesthesia Quick Evaluation

## 2012-05-20 NOTE — Progress Notes (Deleted)
PT. UP AD LIB IN HALL AND VOIDING WITHOUT DIFFICULTIES. TOLERATING DIET AND PO MEDICATIONS. V/S AT 0805 TEMP 97.7, P=69, B/P=108/61, SAT 95% ON ROOM AIR.  PT. VERBALIZED D/C ORDERS. SPOUSE ASSISTING PT. HOME. STAFF ASSISTING PT. TO CAR.  NO DISTRESS NOTED.  CHRIS Tyreshia Ingman RN

## 2012-05-20 NOTE — Progress Notes (Signed)
Orthopedic Tech Progress Note Patient Details:  Lynn Johns May 08, 1951 [id number]  Patient ID: [name], female   DOB: [birth year], 61 y.o.   MRN: [id number] Patient confirmed she already has brace.  Velvia Mehrer T 05/20/2012, 11:13 AM

## 2012-05-20 NOTE — Evaluation (Signed)
Physical Therapy Evaluation Patient Details Name: Emelda Kohlbeck MRN: [id number] DOB: 02/25/1951 Today's Date: 05/20/2012 Time: 1478-2956 PT Time Calculation (min): 9 min  PT Assessment / Plan / Recommendation Clinical Impression  Pt presents s/p ALIF L3-4. Pt is at baseline functional level, no acute PT needs. Will now follow. All education complete prior to d/c    PT Assessment  Patent does not need any further PT services    Follow Up Recommendations  No PT follow up       lEquipment Recommendations  None recommended by PT          Precautions / Restrictions Precautions Precautions: Back Precaution Booklet Issued: Yes (comment) Precaution Comments: pt educated on 3/3 back precautions. Pt with previous surgery and aware. Required Braces or Orthoses: Spinal Brace Spinal Brace: Lumbar corset;Applied in sitting position Restrictions Weight Bearing Restrictions: No         Mobility  Bed Mobility Bed Mobility: Not assessed Transfers Transfers: Sit to Stand;Stand to Sit Sit to Stand: 6: Modified independent (Device/Increase time);With upper extremity assist;From chair/3-in-1 Stand to Sit: 6: Modified independent (Device/Increase time);Without upper extremity assist;To chair/3-in-1 Ambulation/Gait Ambulation/Gait Assistance: 6: Modified independent (Device/Increase time) Ambulation Distance (Feet): 200 Feet Assistive device: None Ambulation/Gait Assistance Details: No difficulties with ambulation.  Gait Pattern: Within Functional Limits General Gait Details: slow on stairs due to pain on L hip Stairs: Yes Stairs Assistance: 6: Modified independent (Device/Increase time) Stair Management Technique: One rail Right;Step to pattern;Forwards Number of Stairs: 12      Visit Information  Last PT Received On: 05/20/12 Assistance Needed: +1       Prior Functioning  Home Living Lives With: Spouse Available Help at Discharge: Family Type of Home: House Home Access:  Stairs to enter Home Layout: Two level Alternate Level Stairs-Number of Steps: 13 Alternate Level Stairs-Rails: Can reach both Additional Comments: pt will be going up/down stairs only once a day for the first few weeks Prior Function Level of Independence: Independent Able to Take Stairs?: Yes Driving: Yes Vocation: Full time employment Comments: teacher Communication Communication: No difficulties Dominant Hand: Right    Cognition  Overall Cognitive Status: Appears within functional limits for tasks assessed/performed Orientation Level: Appears intact for tasks assessed Behavior During Session: [facility] for tasks performed    Extremity/Trunk Assessment Right Lower Extremity Assessment RLE ROM/Strength/Tone: Within functional levels RLE Sensation: WFL - Light Touch Left Lower Extremity Assessment LLE ROM/Strength/Tone: Within functional levels LLE Sensation: WFL - Light Touch   Balance    End of Session PT - End of Session Equipment Utilized During Treatment: Gait belt;Back brace Activity Tolerance: Patient tolerated treatment well Patient left: in chair;with call bell/phone within reach;with family/visitor present Nurse Communication: Mobility status   Milana Kidney 05/20/2012, 11:23 AM  05/20/2012 Milana Kidney DPT PAGER: [phone] OFFICE: [phone]

## 2012-05-20 NOTE — Progress Notes (Signed)
Occupational Therapy Evaluation Patient Details Name: Lynn Johns MRN: [id number] DOB: [birth year] Today's Date: 05/20/2012 Time: 4098-1191 OT Time Calculation (min): 12 min  OT Assessment / Plan / Recommendation Clinical Impression  Pt s/p anterior lumbar fusion L3-4.  Pt able to safely perform ADLs at mod I level.  Educated pt on 3/3 back precautions.  No further acute OT needs.    OT Assessment  Patient does not need any further OT services    Follow Up Recommendations  No OT follow up    Barriers to Discharge      Equipment Recommendations  None recommended by OT    Recommendations for Other Services    Frequency       Precautions / Restrictions Precautions Precautions: Back Precaution Booklet Issued: Yes (comment) Precaution Comments: pt educated on 3/3 back precautions. Pt with previous surgery and aware. Required Braces or Orthoses: Spinal Brace Spinal Brace: Lumbar corset;Applied in sitting position Restrictions Weight Bearing Restrictions: No   Pertinent Vitals/Pain See vitals    ADL  Lower Body Dressing: Performed;Modified independent Where Assessed - Lower Body Dressing: Unsupported sit to stand Toilet Transfer: Performed;Modified independent Toilet Transfer Method:  (ambulating) Toilet Transfer Equipment: Regular height toilet Toileting - Clothing Manipulation and Hygiene: Performed;Modified independent Where Assessed - Toileting Clothing Manipulation and Hygiene: Sit to stand from 3-in-1 or toilet Tub/Shower Transfer: Simulated;Modified independent Tub/Shower Transfer Method: Science writer: Walk in shower Equipment Used: Back brace Transfers/Ambulation Related to ADLs: Pt ambulated to bathroom with mod I ADL Comments: Educated pt on maintaining back precautions while performing ADLs and functional transfers.  Educated pt on technique for LB bathing/dressing, grooming, and toileting hygiene.  Pt demonstrates excellent safety  awareness and adherence to back precautions.    OT Diagnosis:    OT Problem List:   OT Treatment Interventions:     OT Goals    Visit Information  Last OT Received On: 05/20/12 Assistance Needed: +1    Subjective Data      Prior Functioning  Home Living Lives With: Spouse Available Help at Discharge: Family Type of Home: House Home Access: Stairs to enter Home Layout: Two level Alternate Level Stairs-Number of Steps: 13 Alternate Level Stairs-Rails: Can reach both Bathroom Shower/Tub: Walk-in shower (built in seat) Firefighter: Handicapped height Bathroom Accessibility: Yes How Accessible: Accessible via walker Additional Comments: pt will be going up/down stairs only once a day for the first few weeks Prior Function Level of Independence: Independent Able to Take Stairs?: Yes Driving: Yes 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 Communication: No difficulties Dominant Hand: Right    Cognition  Overall Cognitive Status: Appears within functional limits for tasks assessed/performed Arousal/Alertness: Awake/alert Orientation Level: Appears intact for tasks assessed Behavior During Session: [facility] for tasks performed    Extremity/Trunk Assessment Right Upper Extremity Assessment RUE ROM/Strength/Tone: Within functional levels Left Upper Extremity Assessment LUE ROM/Strength/Tone: Within functional levels Right Lower Extremity Assessment RLE ROM/Strength/Tone: Within functional levels RLE Sensation: WFL - Light Touch Left Lower Extremity Assessment LLE ROM/Strength/Tone: Within functional levels LLE Sensation: WFL - Light Touch   Mobility Bed Mobility Bed Mobility: Not assessed Transfers Sit to Stand: 6: Modified independent (Device/Increase time);From bed;From toilet;With upper extremity assist Stand to Sit: 6: Modified independent (Device/Increase time);To bed;To toilet   Exercise    Balance    End of Session OT - End of  Session Equipment Utilized During Treatment: Back brace Activity Tolerance: Patient tolerated treatment well Patient left: in bed;with call bell/phone within reach;with family/visitor  present Nurse Communication: Mobility status;Other (comment) (no equipment needs)  05/20/2012 Cipriano Mile OTR/L Pager [phone] Office [phone]  Cipriano Mile 05/20/2012, 12:55 PM

## 2012-05-20 NOTE — Progress Notes (Signed)
[facility]  Daily Progress Note  Assessment/Planning: POD #1 s/p ALIF L3-4   Advance diet as tolerated  No evidence of complications from exposure  Available as needed.  Subjective  - 1 Day Post-Op  "Concerned about incision"  Objective Filed Vitals:   05/19/12 2000 05/20/12 0022 05/20/12 0356 05/20/12 0805  BP: 107/69 104/60 93/58 108/61  Pulse: 69 74 68 69  Temp: 98 F (36.7 C) 98.2 F (36.8 C) 98.3 F (36.8 C) 97.7 F (36.5 C)  TempSrc: Oral Oral Oral Oral  Resp: 14 16 14 18   Height:      Weight:      SpO2: 93% 93% 93% 95%    Intake/Output Summary (Last 24 hours) at 05/20/12 0847 Last data filed at 05/19/12 1855  Gross per 24 hour  Intake   3620 ml  Output    675 ml  Net   2945 ml    PULM  CTAB CV  RRR GI  soft, NTND, inc c/d/i, appropriate TTP VASC  Palpable pedal pulses  Laboratory CBC    Component Value Date/Time   WBC 13.1* 05/10/2012 1614   HGB 16.8* 05/10/2012 1614   HCT 48.1* 05/10/2012 1614   PLT 302 05/10/2012 1614    BMET    Component Value Date/Time   NA 140 05/10/2012 1614   K 3.4* 05/10/2012 1614   CL 100 05/10/2012 1614   CO2 28 05/10/2012 1614   GLUCOSE 102* 05/10/2012 1614   BUN 11 05/10/2012 1614   CREATININE 0.86 05/10/2012 1614   CALCIUM 9.9 05/10/2012 1614   GFRNONAA 71* 05/10/2012 1614   GFRAA 83* 05/10/2012 1614    Leonides Sake, MD [facility] Office: [phone] Pager: [phone]  05/20/2012, 8:47 AM

## 2012-05-20 NOTE — Progress Notes (Signed)
Patient ID: Lynn Johns, female   DOB: [birth year], 61 y.o.   MRN: [id number] Vital signs stable. Clinically patient feels well some soreness in the abdomen and left hip. Left hip pain response to Robaxin. Has been taking oral fluids not much appetite.  Patient will be transferred to 3000 today encourage ambulation we'll DC PAS those as patient is using Lovenox for DVT prophylaxis okay to shower

## 2012-05-21 LAB — TYPE AND SCREEN
ABO/RH(D): O POS
Antibody Screen: NEGATIVE
Unit division: 0
Unit division: 0

## 2012-05-21 MED ORDER — METHOCARBAMOL 500 MG PO TABS: 500.0000 mg | ORAL_TABLET | Freq: Four times a day (QID) | ORAL | Status: AC | PRN

## 2012-05-21 MED ORDER — DEXMEDETOMIDINE BOLUS VIA INFUSION
1.0000 ug/kg | Freq: Once | INTRAVENOUS | Status: DC
  Filled 2012-05-21: qty 70

## 2012-05-21 MED ORDER — HYDROCODONE-ACETAMINOPHEN 5-325 MG PO TABS: 1.0000 | ORAL_TABLET | ORAL | Status: AC | PRN

## 2012-05-21 MED ORDER — ENOXAPARIN SODIUM 40 MG/0.4ML ~~LOC~~ SOLN: 40.0000 mg | SUBCUTANEOUS | Status: DC

## 2012-05-21 NOTE — Progress Notes (Signed)
Pt discharged home with husband. All follow up appointments medications and limitations reviewed. Pt demonstrates understanding. Elmer Sow, RN

## 2012-05-21 NOTE — Discharge Summary (Signed)
Physician Discharge Summary  Patient ID: Lynn Johns MRN: [id number] DOB/AGE: 02-12-1951 61 y.o.  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 *    Discharged Condition: good  Hospital Course: Uncomplicated ALIF L34  Consults: vascular surgery  Significant Diagnostic Studies: None  Treatments: surgery: Uncomplicated ALIF L34  Discharge Exam: Blood pressure 100/68, pulse 81, temperature 97.9 F (36.6 C), temperature source Oral, resp. rate 18, height 5\' 5"  (1.651 m), weight 69.4 kg (153 lb), SpO2 98.00%. Neurologic: Alert and oriented X 3, normal strength and tone. Normal symmetric reflexes. Normal coordination and gait Dressing CDI Disposition: Home   Medication List  As of 05/21/2012  2:54 PM   ASK your doctor about these medications         atenolol 100 MG tablet   Commonly known as: TENORMIN   Take 100 mg by mouth daily.      HYDROcodone-acetaminophen 5-325 MG per tablet   Commonly known as: NORCO   Take 1 tablet by mouth every 4 (four) hours as needed.      losartan-hydrochlorothiazide 100-12.5 MG per tablet   Commonly known as: HYZAAR   Take 1 tablet by mouth daily.             Signed: Dorian Heckle, MD 05/21/2012, 2:54 PM

## 2012-05-21 NOTE — Progress Notes (Signed)
Subjective: Patient reports doing well.  Wants to go home  Objective: Vital signs in last 24 hours: Temp:  [97.7 F (36.5 C)-99.6 F (37.6 C)] 99.6 F (37.6 C) (06/09 0235) Pulse Rate:  [67-100] 96  (06/09 0235) Resp:  [18-20] 18  (06/09 0235) BP: (95-121)/(61-73) 98/66 mmHg (06/09 0235) SpO2:  [94 %-98 %] 94 % (06/09 0235)  Intake/Output from previous day:   Intake/Output this shift:    Physical Exam: Ambulating well.  Wearing brace.  Lab Results: No results found for this basename: WBC:2,HGB:2,HCT:2,PLT:2 in the last 72 hours BMET No results found for this basename: NA:2,K:2,CL:2,CO2:2,GLUCOSE:2,BUN:2,CREATININE:2,CALCIUM:2 in the last 72 hours  Studies/Results: Dg Lumbar Spine 2-3 Views  05/19/2012  *RADIOLOGY REPORT*  Clinical Data: L3-4 fusion.  LUMBAR SPINE - 2-3 VIEW  Comparison: 05/04/2012.  Findings: Remote anterior fusion L5-S1.  Two intraoperative views of the lumbar spine submitted for review after surgery.  This reveals new anterior fusion L3-4 with interbody spacer and probable small vascular clip anterior to the superior margin of the L4 vertebra (correlation with operative procedure recommended).  IMPRESSION: New L3-4 anterior fusion with remote L5-S1 anterior fusion.  Please see above.  Original Report Authenticated By: Fuller Canada, M.D.   Dg Abd 1 View  05/19/2012  *RADIOLOGY REPORT*  Clinical Data: Anterior fusion, instrument count  ABDOMEN - 1 VIEW  Comparison: Lumbar spine film of 05/04/2012  Findings: An anterior fusion device again is noted at the L5 S1 level.  New interbody fusion has been performed at L3-4.  No other opaque foreign body is seen although the entire abdomen is not included on the present film.  IMPRESSION:  1.  No opaque foreign body. 2.  New interbody fusion at L at L3-4. 3.  Anterior fusion again noted at L5-S1.  I called this report to the operating room at the time of interpretation.  Original Report Authenticated By: Juline Patch, M.D.      Assessment/Plan: Doing well following ALIF.  D/C home.    LOS: 2 days    Dorian Heckle, MD 05/21/2012, 4:53 AM

## 2012-05-22 ENCOUNTER — Encounter (HOSPITAL_COMMUNITY): Payer: Self-pay | Admitting: Neurological Surgery

## 2012-05-24 MED FILL — Sodium Chloride IV Soln 0.9%: INTRAVENOUS | Qty: 1000 | Status: AC

## 2012-05-24 MED FILL — Heparin Sodium (Porcine) Inj 1000 Unit/ML: INTRAMUSCULAR | Qty: 30 | Status: AC

## 2012-06-12 HISTORY — PX: MYRINGOTOMY: SUR874

## 2012-10-19 ENCOUNTER — Other Ambulatory Visit: Payer: Self-pay | Admitting: Otolaryngology

## 2012-10-19 DIAGNOSIS — H9209 Otalgia, unspecified ear: Secondary | ICD-10-CM

## 2012-10-19 DIAGNOSIS — H905 Unspecified sensorineural hearing loss: Secondary | ICD-10-CM

## 2012-10-25 ENCOUNTER — Ambulatory Visit
Admission: RE | Admit: 2012-10-25 | Discharge: 2012-10-25 | Disposition: A | Discharge status: Home / Self Care | Payer: BC Managed Care – PPO | Source: Ambulatory Visit | Attending: Otolaryngology | Admitting: Otolaryngology

## 2012-10-25 DIAGNOSIS — H905 Unspecified sensorineural hearing loss: Secondary | ICD-10-CM

## 2012-10-25 DIAGNOSIS — H9209 Otalgia, unspecified ear: Secondary | ICD-10-CM

## 2012-10-25 MED ORDER — GADOBENATE DIMEGLUMINE 529 MG/ML IV SOLN
14.0000 mL | Freq: Once | INTRAVENOUS | Status: AC | PRN
  Administered 2012-10-25: 14 mL via INTRAVENOUS

## 2012-11-24 ENCOUNTER — Other Ambulatory Visit: Payer: Self-pay | Admitting: Obstetrics and Gynecology

## 2012-11-24 DIAGNOSIS — Z1231 Encounter for screening mammogram for malignant neoplasm of breast: Secondary | ICD-10-CM

## 2012-11-28 ENCOUNTER — Other Ambulatory Visit (HOSPITAL_COMMUNITY): Payer: Self-pay | Admitting: Neurological Surgery

## 2013-01-04 ENCOUNTER — Ambulatory Visit
Admission: RE | Admit: 2013-01-04 | Discharge: 2013-01-04 | Disposition: A | Discharge status: Home / Self Care | Payer: BC Managed Care – PPO | Source: Ambulatory Visit | Attending: Obstetrics and Gynecology | Admitting: Obstetrics and Gynecology

## 2013-01-04 DIAGNOSIS — Z1231 Encounter for screening mammogram for malignant neoplasm of breast: Secondary | ICD-10-CM

## 2013-03-26 ENCOUNTER — Other Ambulatory Visit (HOSPITAL_COMMUNITY): Payer: Self-pay | Admitting: Neurological Surgery

## 2013-03-26 ENCOUNTER — Other Ambulatory Visit: Payer: Self-pay | Admitting: Neurological Surgery

## 2013-03-26 DIAGNOSIS — M47816 Spondylosis without myelopathy or radiculopathy, lumbar region: Secondary | ICD-10-CM

## 2013-04-04 ENCOUNTER — Encounter (HOSPITAL_COMMUNITY): Payer: Self-pay | Admitting: Pharmacy Technician

## 2013-04-10 ENCOUNTER — Ambulatory Visit (HOSPITAL_COMMUNITY)
Admission: RE | Admit: 2013-04-10 | Discharge: 2013-04-10 | Disposition: A | Discharge status: Home / Self Care | Payer: BC Managed Care – PPO | Source: Ambulatory Visit | Attending: Neurological Surgery | Admitting: Neurological Surgery

## 2013-04-10 DIAGNOSIS — M47816 Spondylosis without myelopathy or radiculopathy, lumbar region: Secondary | ICD-10-CM

## 2013-04-10 DIAGNOSIS — M4000 Postural kyphosis, site unspecified: Secondary | ICD-10-CM | POA: Insufficient documentation

## 2013-04-10 DIAGNOSIS — IMO0002 Reserved for concepts with insufficient information to code with codable children: Secondary | ICD-10-CM | POA: Insufficient documentation

## 2013-04-10 DIAGNOSIS — M48061 Spinal stenosis, lumbar region without neurogenic claudication: Secondary | ICD-10-CM | POA: Insufficient documentation

## 2013-04-10 DIAGNOSIS — Z981 Arthrodesis status: Secondary | ICD-10-CM | POA: Insufficient documentation

## 2013-04-10 MED ORDER — IOHEXOL 180 MG/ML  SOLN
20.0000 mL | Freq: Once | INTRAMUSCULAR | Status: AC | PRN
  Administered 2013-04-10: 14 mL via INTRATHECAL

## 2013-04-10 MED ORDER — ONDANSETRON HCL 4 MG/2ML IJ SOLN: 4.0000 mg | Freq: Four times a day (QID) | INTRAMUSCULAR | Status: DC | PRN

## 2013-04-10 MED ORDER — HYDROCODONE-ACETAMINOPHEN 5-325 MG PO TABS
ORAL_TABLET | ORAL | Status: AC
  Filled 2013-04-10: qty 2

## 2013-04-10 MED ORDER — HYDROCODONE-ACETAMINOPHEN 5-325 MG PO TABS
1.0000 | ORAL_TABLET | ORAL | Status: DC | PRN
  Administered 2013-04-10: 2 via ORAL

## 2013-04-10 MED ORDER — DIAZEPAM 5 MG PO TABS: 10.0000 mg | ORAL_TABLET | Freq: Once | ORAL | Status: DC

## 2013-04-10 MED ORDER — DIAZEPAM 5 MG PO TABS
ORAL_TABLET | ORAL | Status: AC
  Filled 2013-04-10: qty 2

## 2013-04-10 NOTE — Procedures (Signed)
Name Lynn Johns is a 62 year old individual is had significant problems with chronic leg pain she's had spondylitic disease at L5-S1 and at L3-L4 and has residual radicular pain she's had an anterior lumbar decompression arthrodesis at both these levels. MRI does not demonstrate any overt compressive phenomenon however because of persistence of pain failure is of conservative management the myelograms now been suggested and is being performed.  Pre op Dx: Lumbar spondylosis Post op Dx: Lumbar spondylosis status post arthrodesis L3-4 L5-S1 Procedure: Lumbar myelogram Surgeon: Shatyra Becka Puncture level: L3-L4 Fluid color: Clear colorless Injection: Iohexol 189 cc Findings: Mild spondylosis L4-L5

## 2013-04-11 ENCOUNTER — Telehealth (HOSPITAL_COMMUNITY): Payer: Self-pay | Admitting: *Deleted

## 2013-07-11 ENCOUNTER — Other Ambulatory Visit: Payer: Self-pay | Admitting: Neurological Surgery

## 2013-08-08 ENCOUNTER — Encounter (HOSPITAL_COMMUNITY): Payer: Self-pay | Admitting: Pharmacy Technician

## 2013-08-09 ENCOUNTER — Other Ambulatory Visit (HOSPITAL_COMMUNITY): Payer: Self-pay | Admitting: *Deleted

## 2013-08-09 NOTE — Pre-Procedure Instructions (Signed)
Lynn Johns  08/09/2013   Your procedure is scheduled on:  August 21, 2013  Report to [facility] Short Stay Center at 5:30 AM.  Call this number if you have problems the morning of surgery: [phone]   Remember:   Do not eat food or drink liquids after midnight.   Take these medicines the morning of surgery with A SIP OF WATER: atenolol (TENORMIN), cetirizine (ZYRTEC)     Do not wear jewelry, make-up or nail polish.  Do not wear lotions, powders, or perfumes. You may wear deodorant.  Do not shave 48 hours prior to surgery. Men may shave face and neck.  Do not bring valuables to the hospital.  [facility] is not responsible                   for any belongings or valuables.  Contacts, dentures or bridgework may not be worn into surgery.  Leave suitcase in the car. After surgery it may be brought to your room.  For patients admitted to the hospital, checkout time is 11:00 AM the day of  discharge.    Special Instructions: Shower using CHG 2 nights before surgery and the night before surgery.  If you shower the day of surgery use CHG.  Use special wash - you have one bottle of CHG for all showers.  You should use approximately 1/3 of the bottle for each shower.   Please read over the following fact sheets that you were given: Pain Booklet, Coughing and Deep Breathing, Blood Transfusion Information, MRSA Information and Surgical Site Infection Prevention

## 2013-08-10 ENCOUNTER — Encounter (HOSPITAL_COMMUNITY)
Admission: RE | Admit: 2013-08-10 | Discharge: 2013-08-10 | Disposition: A | Discharge status: Home / Self Care | Payer: BC Managed Care – PPO | Source: Ambulatory Visit | Attending: Neurological Surgery | Admitting: Neurological Surgery

## 2013-08-10 ENCOUNTER — Encounter (HOSPITAL_COMMUNITY): Payer: Self-pay

## 2013-08-10 DIAGNOSIS — Z01812 Encounter for preprocedural laboratory examination: Secondary | ICD-10-CM | POA: Insufficient documentation

## 2013-08-10 DIAGNOSIS — Z01818 Encounter for other preprocedural examination: Secondary | ICD-10-CM | POA: Insufficient documentation

## 2013-08-10 HISTORY — DX: Unspecified osteoarthritis, unspecified site: M19.90

## 2013-08-10 LAB — BASIC METABOLIC PANEL
BUN: 11 mg/dL (ref 6–23)
CO2: 27 mEq/L (ref 19–32)
Calcium: 10 mg/dL (ref 8.4–10.5)
Chloride: 104 mEq/L (ref 96–112)
Creatinine, Ser: 0.9 mg/dL (ref 0.50–1.10)
GFR calc Af Amer: 78 mL/min — ABNORMAL LOW (ref >90)
GFR calc non Af Amer: 67 mL/min — ABNORMAL LOW (ref >90)
Glucose, Bld: 90 mg/dL (ref 70–99)
Potassium: 3.8 mEq/L (ref 3.5–5.1)
Sodium: 141 mEq/L (ref 135–145)

## 2013-08-10 LAB — SURGICAL PCR SCREEN
MRSA, PCR: NEGATIVE
Staphylococcus aureus: NEGATIVE

## 2013-08-10 LAB — CBC
HCT: 50.2 % — ABNORMAL HIGH (ref 36.0–46.0)
Hemoglobin: 17.9 g/dL — ABNORMAL HIGH (ref 12.0–15.0)
MCH: 30.9 pg (ref 26.0–34.0)
MCHC: 35.7 g/dL (ref 30.0–36.0)
MCV: 86.7 fL (ref 78.0–100.0)
Platelets: 339 10*3/uL (ref 150–400)
RBC: 5.79 MIL/uL — ABNORMAL HIGH (ref 3.87–5.11)
RDW: 13.7 % (ref 11.5–15.5)
WBC: 10.1 10*3/uL (ref 4.0–10.5)

## 2013-08-10 LAB — TYPE AND SCREEN
ABO/RH(D): O POS
Antibody Screen: NEGATIVE

## 2013-08-10 NOTE — Progress Notes (Signed)
Call to [facility] grp. At [facility], requested last ekg & ov note. Pt. Denies ever seeing anyone in cardiac or ever having a stress test & /or echo.

## 2013-08-10 NOTE — Progress Notes (Signed)
Call to Dr. Donaciano Eva office, requested notes , spoke with Alvino Chapel, she cannot transmit our request without a signed release fr. The pt.  Contacted pt. & she agrees to contact their office for arrangements to sign authorization for release.

## 2013-08-10 NOTE — Progress Notes (Signed)
Spoke with Jaynie Collins, PAC, regarding pt. Anesth. History reported. She will review the chart.

## 2013-08-14 NOTE — Progress Notes (Signed)
Anesthesia Chart Review: Patient is a 62 year old female scheduled for L4-5 PLIF on 08/21/13 by Dr. Danielle Dess.   History includes non-smoker, HTN, multiple surgeries including L3-4 ALIF 05/2012, ALIF '11, lumbar discectomy '10, C5-6 diskectomy 11/2011, cystocele repair, '07, and vaginal hysterectomy '06. For anesthesia history there is a long notation outlining her history of post-operative N/V (requests scopolamine patch), post-operative hypotension, intolerances to fentanyl (rash, insomnia, crying) and Versed (rash, insomnia, memory loss).  She also feels that somehow anesthesia and/or intubation caused or contributed to eustachian tube dysfunction because she awaken with pain and a plugged feeling in her ears as if she was underwater.  (I reviewed anesthesia records and it does not appear that she was a difficult intubation.  She did not have a nasal intubation or NGT--only an OGT, traumatic injury to her eustachian tube seems less likely.)  She later underwent T-tube insertion by ENT Dr. Jenne Pane.  She is now seeing ENT Dr. Dorma Russell for continued fullness in her right ear with improvement on the left. 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 10/2012 MRI of the brain with dedicated imaging of the internal auditory canals was unremarkable. PCP was previously Dr. Elie Confer.  Dr. Shirlean Mylar will now be taking over Dr. Marya Amsler patient's and is scheduled to see patient later this month.  EKG on 04/13/13 (PCP) showed NSR.  CXR on 08/10/13 showed no evidence of acute cardiopulmonary disease.  Preoperative labs noted.  H/H 17.9/50.2 (previously 16.8/48.1 on 5/289/13). (There was no reported history of polycythemia although her HCT was up to 52.3 in July 2010 prior to L5-/S1 diskectomy.) Labs reviewed with anesthesiologist Dr. Jacklynn Bue.  There does not appear to be an acute change, so anticipate that she can  proceed from an anesthesia standpoint; however, plan to have Dr. Shirlean Mylar review for future follow-up purposes--or for pre-operative recommendations, if any. I called and spoke with Judeth Cornfield at Dr. Marland Mcalpine office to have her review labs once faxed (faxed with confirmation to [phone]).      Velna Ochs [facility] Short Stay Center/Anesthesiology Phone [phone] 08/14/2013 5:46 PM

## 2013-08-20 MED ORDER — CEFAZOLIN SODIUM-DEXTROSE 2-3 GM-% IV SOLR
2.0000 g | INTRAVENOUS | Status: AC
  Administered 2013-08-21: 2 g via INTRAVENOUS
  Filled 2013-08-20: qty 50

## 2013-08-21 ENCOUNTER — Encounter (HOSPITAL_COMMUNITY)
Admission: RE | Disposition: A | Discharge status: Home / Self Care | Payer: Self-pay | Source: Ambulatory Visit | Attending: Neurological Surgery

## 2013-08-21 ENCOUNTER — Ambulatory Visit (HOSPITAL_COMMUNITY): Payer: BC Managed Care – PPO

## 2013-08-21 ENCOUNTER — Encounter (HOSPITAL_COMMUNITY): Payer: Self-pay | Admitting: *Deleted

## 2013-08-21 ENCOUNTER — Inpatient Hospital Stay (HOSPITAL_COMMUNITY)
Admission: RE | Admit: 2013-08-21 | Discharge: 2013-08-23 | DRG: 756 | Disposition: A | Discharge status: Home / Self Care | Payer: BC Managed Care – PPO | Source: Ambulatory Visit | Attending: Neurological Surgery | Admitting: Neurological Surgery

## 2013-08-21 ENCOUNTER — Ambulatory Visit (HOSPITAL_COMMUNITY): Payer: BC Managed Care – PPO | Admitting: Anesthesiology

## 2013-08-21 ENCOUNTER — Encounter (HOSPITAL_COMMUNITY): Payer: Self-pay | Admitting: Vascular Surgery

## 2013-08-21 DIAGNOSIS — IMO0002 Reserved for concepts with insufficient information to code with codable children: Secondary | ICD-10-CM

## 2013-08-21 DIAGNOSIS — I10 Essential (primary) hypertension: Secondary | ICD-10-CM | POA: Diagnosis present

## 2013-08-21 DIAGNOSIS — R339 Retention of urine, unspecified: Secondary | ICD-10-CM | POA: Diagnosis present

## 2013-08-21 DIAGNOSIS — Z981 Arthrodesis status: Secondary | ICD-10-CM

## 2013-08-21 DIAGNOSIS — M4310 Spondylolisthesis, site unspecified: Secondary | ICD-10-CM | POA: Diagnosis present

## 2013-08-21 DIAGNOSIS — M47817 Spondylosis without myelopathy or radiculopathy, lumbosacral region: Principal | ICD-10-CM | POA: Diagnosis present

## 2013-08-21 SURGERY — POSTERIOR LUMBAR FUSION 1 LEVEL
Anesthesia: General | Site: Back | Wound class: Clean

## 2013-08-21 MED ORDER — SODIUM CHLORIDE 0.9 % IJ SOLN
3.0000 mL | Freq: Two times a day (BID) | INTRAMUSCULAR | Status: DC
  Administered 2013-08-22: 3 mL via INTRAVENOUS

## 2013-08-21 MED ORDER — METOCLOPRAMIDE HCL 5 MG/ML IJ SOLN
10.0000 mg | Freq: Once | INTRAMUSCULAR | Status: AC | PRN
  Administered 2013-08-21: 10 mg via INTRAVENOUS

## 2013-08-21 MED ORDER — SPIRONOLACTONE 50 MG PO TABS
50.0000 mg | ORAL_TABLET | Freq: Every day | ORAL | Status: DC
  Administered 2013-08-22 – 2013-08-23 (×2): 50 mg via ORAL
  Filled 2013-08-21 (×3): qty 1

## 2013-08-21 MED ORDER — SODIUM CHLORIDE 0.9 % IJ SOLN: 3.0000 mL | INTRAMUSCULAR | Status: DC | PRN

## 2013-08-21 MED ORDER — ATENOLOL 100 MG PO TABS
100.0000 mg | ORAL_TABLET | Freq: Every day | ORAL | Status: DC
Start: 2013-08-22 — End: 2013-08-23
  Administered 2013-08-22: 100 mg via ORAL
  Filled 2013-08-21 (×3): qty 1

## 2013-08-21 MED ORDER — OXYCODONE HCL 5 MG PO TABS: 5.0000 mg | ORAL_TABLET | Freq: Once | ORAL | Status: DC | PRN

## 2013-08-21 MED ORDER — METOCLOPRAMIDE HCL 5 MG/ML IJ SOLN: 10.0000 mg | Freq: Once | INTRAMUSCULAR | Status: DC | PRN

## 2013-08-21 MED ORDER — DEXTROSE 5 % IV SOLN
500.0000 mg | Freq: Four times a day (QID) | INTRAVENOUS | Status: DC | PRN
  Filled 2013-08-21: qty 5

## 2013-08-21 MED ORDER — METHOCARBAMOL 500 MG PO TABS
500.0000 mg | ORAL_TABLET | Freq: Four times a day (QID) | ORAL | Status: DC | PRN
  Administered 2013-08-23: 500 mg via ORAL
  Filled 2013-08-21: qty 1

## 2013-08-21 MED ORDER — VECURONIUM BROMIDE 10 MG IV SOLR
INTRAVENOUS | Status: DC | PRN
  Administered 2013-08-21 (×2): 2 mg via INTRAVENOUS

## 2013-08-21 MED ORDER — ACETAMINOPHEN 650 MG RE SUPP: 650.0000 mg | RECTAL | Status: DC | PRN

## 2013-08-21 MED ORDER — ROCURONIUM BROMIDE 100 MG/10ML IV SOLN
INTRAVENOUS | Status: DC | PRN
  Administered 2013-08-21: 50 mg via INTRAVENOUS

## 2013-08-21 MED ORDER — SODIUM CHLORIDE 0.9 % IR SOLN
Status: DC | PRN
  Administered 2013-08-21: 09:00:00

## 2013-08-21 MED ORDER — KETOROLAC TROMETHAMINE 15 MG/ML IJ SOLN
15.0000 mg | Freq: Four times a day (QID) | INTRAMUSCULAR | Status: AC
  Filled 2013-08-21 (×6): qty 1

## 2013-08-21 MED ORDER — SENNA 8.6 MG PO TABS
1.0000 | ORAL_TABLET | Freq: Two times a day (BID) | ORAL | Status: DC
  Administered 2013-08-22 – 2013-08-23 (×3): 8.6 mg via ORAL
  Filled 2013-08-21 (×5): qty 1

## 2013-08-21 MED ORDER — THROMBIN 20000 UNITS EX SOLR
CUTANEOUS | Status: DC | PRN
  Administered 2013-08-21: 09:00:00 via TOPICAL

## 2013-08-21 MED ORDER — ONDANSETRON HCL 4 MG/2ML IJ SOLN
INTRAMUSCULAR | Status: DC | PRN
  Administered 2013-08-21: 4 mg via INTRAVENOUS

## 2013-08-21 MED ORDER — PHENOL 1.4 % MT LIQD: 1.0000 | OROMUCOSAL | Status: DC | PRN

## 2013-08-21 MED ORDER — SODIUM CHLORIDE 0.9 % IV SOLN: 250.0000 mL | INTRAVENOUS | Status: DC

## 2013-08-21 MED ORDER — OXYCODONE-ACETAMINOPHEN 5-325 MG PO TABS
1.0000 | ORAL_TABLET | ORAL | Status: DC | PRN
  Administered 2013-08-22: 1 via ORAL
  Filled 2013-08-21: qty 1

## 2013-08-21 MED ORDER — DOCUSATE SODIUM 100 MG PO CAPS
100.0000 mg | ORAL_CAPSULE | Freq: Two times a day (BID) | ORAL | Status: DC
  Administered 2013-08-22 – 2013-08-23 (×3): 100 mg via ORAL
  Filled 2013-08-21 (×3): qty 1

## 2013-08-21 MED ORDER — LORATADINE 10 MG PO TABS
10.0000 mg | ORAL_TABLET | Freq: Every day | ORAL | Status: DC
  Filled 2013-08-21 (×4): qty 1

## 2013-08-21 MED ORDER — MORPHINE SULFATE 2 MG/ML IJ SOLN: 1.0000 mg | INTRAMUSCULAR | Status: DC | PRN

## 2013-08-21 MED ORDER — LIDOCAINE HCL (CARDIAC) 20 MG/ML IV SOLN
INTRAVENOUS | Status: DC | PRN
  Administered 2013-08-21: 100 mg via INTRAVENOUS

## 2013-08-21 MED ORDER — SCOPOLAMINE 1 MG/3DAYS TD PT72
MEDICATED_PATCH | TRANSDERMAL | Status: AC
  Administered 2013-08-21: 1 via TRANSDERMAL
  Filled 2013-08-21: qty 1

## 2013-08-21 MED ORDER — POLYETHYLENE GLYCOL 3350 17 G PO PACK
17.0000 g | PACK | Freq: Every day | ORAL | Status: DC | PRN
  Filled 2013-08-21: qty 1

## 2013-08-21 MED ORDER — LACTATED RINGERS IV SOLN
INTRAVENOUS | Status: DC | PRN
  Administered 2013-08-21 (×2): via INTRAVENOUS

## 2013-08-21 MED ORDER — BUPIVACAINE HCL (PF) 0.5 % IJ SOLN
INTRAMUSCULAR | Status: DC | PRN
  Administered 2013-08-21: 20 mL

## 2013-08-21 MED ORDER — MENTHOL 3 MG MT LOZG: 1.0000 | LOZENGE | OROMUCOSAL | Status: DC | PRN

## 2013-08-21 MED ORDER — OXYCODONE HCL 5 MG/5ML PO SOLN: 5.0000 mg | Freq: Once | ORAL | Status: DC | PRN

## 2013-08-21 MED ORDER — LOSARTAN POTASSIUM 50 MG PO TABS
100.0000 mg | ORAL_TABLET | Freq: Every day | ORAL | Status: DC
  Administered 2013-08-22: 100 mg via ORAL
  Filled 2013-08-21 (×3): qty 2

## 2013-08-21 MED ORDER — BISACODYL 10 MG RE SUPP: 10.0000 mg | Freq: Every day | RECTAL | Status: DC | PRN

## 2013-08-21 MED ORDER — MORPHINE SULFATE 10 MG/ML IJ SOLN
INTRAMUSCULAR | Status: DC | PRN
  Administered 2013-08-21: 4 mg via INTRAVENOUS
  Administered 2013-08-21 (×3): 2 mg via INTRAVENOUS

## 2013-08-21 MED ORDER — PHENYLEPHRINE HCL 10 MG/ML IJ SOLN
INTRAMUSCULAR | Status: DC | PRN
  Administered 2013-08-21: 80 ug via INTRAVENOUS

## 2013-08-21 MED ORDER — SODIUM CHLORIDE 0.9 % IV SOLN
10.0000 mg | INTRAVENOUS | Status: DC | PRN
  Administered 2013-08-21: 10 ug/min via INTRAVENOUS

## 2013-08-21 MED ORDER — LOSARTAN POTASSIUM-HCTZ 100-12.5 MG PO TABS: 1.0000 | ORAL_TABLET | Freq: Every day | ORAL | Status: DC

## 2013-08-21 MED ORDER — GLYCOPYRROLATE 0.2 MG/ML IJ SOLN
INTRAMUSCULAR | Status: DC | PRN
  Administered 2013-08-21: 0.6 mg via INTRAVENOUS
  Administered 2013-08-21: 0.2 mg via INTRAVENOUS

## 2013-08-21 MED ORDER — FLEET ENEMA 7-19 GM/118ML RE ENEM: 1.0000 | ENEMA | Freq: Once | RECTAL | Status: AC | PRN

## 2013-08-21 MED ORDER — ACETAMINOPHEN 325 MG PO TABS: 650.0000 mg | ORAL_TABLET | ORAL | Status: DC | PRN

## 2013-08-21 MED ORDER — LIDOCAINE-EPINEPHRINE 1 %-1:100000 IJ SOLN
INTRAMUSCULAR | Status: DC | PRN
  Administered 2013-08-21: 5 mL

## 2013-08-21 MED ORDER — EPHEDRINE SULFATE 50 MG/ML IJ SOLN
INTRAMUSCULAR | Status: DC | PRN
  Administered 2013-08-21 (×2): 5 mg via INTRAVENOUS
  Administered 2013-08-21 (×3): 10 mg via INTRAVENOUS

## 2013-08-21 MED ORDER — ALUM & MAG HYDROXIDE-SIMETH 200-200-20 MG/5ML PO SUSP
30.0000 mL | Freq: Four times a day (QID) | ORAL | Status: DC | PRN
  Administered 2013-08-22: 30 mL via ORAL
  Filled 2013-08-21: qty 30

## 2013-08-21 MED ORDER — NEOSTIGMINE METHYLSULFATE 1 MG/ML IJ SOLN
INTRAMUSCULAR | Status: DC | PRN
  Administered 2013-08-21: 3 mg via INTRAVENOUS
  Administered 2013-08-21: 2 mg via INTRAVENOUS

## 2013-08-21 MED ORDER — ONDANSETRON HCL 4 MG/2ML IJ SOLN
4.0000 mg | INTRAMUSCULAR | Status: DC | PRN
  Administered 2013-08-22: 4 mg via INTRAVENOUS
  Filled 2013-08-21 (×2): qty 2

## 2013-08-21 MED ORDER — PROPOFOL 10 MG/ML IV BOLUS
INTRAVENOUS | Status: DC | PRN
  Administered 2013-08-21: 160 mg via INTRAVENOUS

## 2013-08-21 MED ORDER — KETOROLAC TROMETHAMINE 30 MG/ML IJ SOLN
INTRAMUSCULAR | Status: AC
  Administered 2013-08-21: 15 mg
  Filled 2013-08-21: qty 1

## 2013-08-21 SURGICAL SUPPLY — 65 items
ADH SKN CLS APL DERMABOND .7 (GAUZE/BANDAGES/DRESSINGS) ×1
ADH SKN CLS LQ APL DERMABOND (GAUZE/BANDAGES/DRESSINGS) ×1
BAG DECANTER FOR FLEXI CONT (MISCELLANEOUS) ×2 IMPLANT
BLADE SURG ROTATE 9660 (MISCELLANEOUS) IMPLANT
BUR MATCHSTICK NEURO 3.0 LAGG (BURR) ×2 IMPLANT
CAGE 13MM (Cage) ×2 IMPLANT
CANISTER SUCTION 2500CC (MISCELLANEOUS) ×2 IMPLANT
CLOTH BEACON ORANGE TIMEOUT ST (SAFETY) ×2 IMPLANT
CONT SPEC 4OZ CLIKSEAL STRL BL (MISCELLANEOUS) ×4 IMPLANT
COVER BACK TABLE 24X17X13 BIG (DRAPES) IMPLANT
COVER TABLE BACK 60X90 (DRAPES) ×2 IMPLANT
DECANTER SPIKE VIAL GLASS SM (MISCELLANEOUS) ×2 IMPLANT
DERMABOND ADHESIVE PROPEN (GAUZE/BANDAGES/DRESSINGS) ×1
DERMABOND ADVANCED (GAUZE/BANDAGES/DRESSINGS) ×1
DERMABOND ADVANCED .7 DNX12 (GAUZE/BANDAGES/DRESSINGS) ×1 IMPLANT
DERMABOND ADVANCED .7 DNX6 (GAUZE/BANDAGES/DRESSINGS) ×1 IMPLANT
DRAPE C-ARM 42X72 X-RAY (DRAPES) ×4 IMPLANT
DRAPE LAPAROTOMY 100X72X124 (DRAPES) ×2 IMPLANT
DRAPE POUCH INSTRU U-SHP 10X18 (DRAPES) ×2 IMPLANT
DRAPE PROXIMA HALF (DRAPES) ×6 IMPLANT
DRSG OPSITE POSTOP 4X8 (GAUZE/BANDAGES/DRESSINGS) ×2 IMPLANT
DURAPREP 26ML APPLICATOR (WOUND CARE) ×2 IMPLANT
ELECT REM PT RETURN 9FT ADLT (ELECTROSURGICAL) ×2
ELECTRODE REM PT RTRN 9FT ADLT (ELECTROSURGICAL) ×1 IMPLANT
GAUZE SPONGE 4X4 16PLY XRAY LF (GAUZE/BANDAGES/DRESSINGS) IMPLANT
GLOVE BIO SURGEON STRL SZ8 (GLOVE) ×2 IMPLANT
GLOVE BIOGEL PI IND STRL 8.5 (GLOVE) ×3 IMPLANT
GLOVE BIOGEL PI INDICATOR 8.5 (GLOVE) ×3
GLOVE ECLIPSE 8.5 STRL (GLOVE) ×4 IMPLANT
GLOVE EXAM NITRILE LRG STRL (GLOVE) ×2 IMPLANT
GLOVE EXAM NITRILE MD LF STRL (GLOVE) IMPLANT
GLOVE EXAM NITRILE XL STR (GLOVE) IMPLANT
GLOVE EXAM NITRILE XS STR PU (GLOVE) IMPLANT
GLOVE INDICATOR 8.0 STRL GRN (GLOVE) ×6 IMPLANT
GLOVE SURG SS PI 8.0 STRL IVOR (GLOVE) ×8 IMPLANT
GOWN BRE IMP SLV AUR LG STRL (GOWN DISPOSABLE) IMPLANT
GOWN BRE IMP SLV AUR XL STRL (GOWN DISPOSABLE) ×2 IMPLANT
GOWN STRL REIN 2XL LVL4 (GOWN DISPOSABLE) ×8 IMPLANT
KIT BASIN OR (CUSTOM PROCEDURE TRAY) ×2 IMPLANT
KIT ROOM TURNOVER OR (KITS) ×2 IMPLANT
MILL MEDIUM DISP (BLADE) ×2 IMPLANT
NEEDLE HYPO 22GX1.5 SAFETY (NEEDLE) ×2 IMPLANT
NS IRRIG 1000ML POUR BTL (IV SOLUTION) ×2 IMPLANT
PACK FOAM VITOSS 10CC (Orthopedic Implant) ×2 IMPLANT
PACK LAMINECTOMY NEURO (CUSTOM PROCEDURE TRAY) ×2 IMPLANT
PAD ARMBOARD 7.5X6 YLW CONV (MISCELLANEOUS) ×6 IMPLANT
PATTIES SURGICAL .5 X1 (DISPOSABLE) ×2 IMPLANT
ROD 70MM (Rod) ×4 IMPLANT
ROD SPNL 70X5.5XPRECONTOUR (Rod) ×2 IMPLANT
SCREW 40MM (Screw) ×12 IMPLANT
SCREW SET SPINAL STD HEXALOBE (Screw) ×12 IMPLANT
SPONGE GAUZE 4X4 12PLY (GAUZE/BANDAGES/DRESSINGS) ×2 IMPLANT
SPONGE LAP 4X18 X RAY DECT (DISPOSABLE) IMPLANT
SPONGE SURGIFOAM ABS GEL 100 (HEMOSTASIS) ×2 IMPLANT
SUT VIC AB 1 CT1 18XBRD ANBCTR (SUTURE) ×2 IMPLANT
SUT VIC AB 1 CT1 8-18 (SUTURE) ×4
SUT VIC AB 2-0 CP2 18 (SUTURE) ×4 IMPLANT
SUT VIC AB 3-0 SH 8-18 (SUTURE) ×4 IMPLANT
SYR 20ML ECCENTRIC (SYRINGE) ×2 IMPLANT
SYR 3ML LL SCALE MARK (SYRINGE) ×8 IMPLANT
TOWEL OR 17X24 6PK STRL BLUE (TOWEL DISPOSABLE) ×2 IMPLANT
TOWEL OR 17X26 10 PK STRL BLUE (TOWEL DISPOSABLE) ×2 IMPLANT
TRAP SPECIMEN MUCOUS 40CC (MISCELLANEOUS) ×2 IMPLANT
TRAY FOLEY CATH 14FRSI W/METER (CATHETERS) ×2 IMPLANT
WATER STERILE IRR 1000ML POUR (IV SOLUTION) ×2 IMPLANT

## 2013-08-21 NOTE — Anesthesia Procedure Notes (Signed)
Procedure Name: Intubation Date/Time: 08/21/2013 7:47 AM Performed by: Quentin Ore Pre-anesthesia Checklist: Patient identified, Emergency Drugs available, Suction available, Patient being monitored and Timeout performed Patient Re-evaluated:Patient Re-evaluated prior to inductionOxygen Delivery Method: Circle system utilized Preoxygenation: Pre-oxygenation with 100% oxygen Intubation Type: IV induction Ventilation: Mask ventilation without difficulty Laryngoscope Size: Mac and 3 Grade View: Grade I Tube type: Oral Tube size: 7.0 mm Number of attempts: 1 Airway Equipment and Method: Stylet Placement Confirmation: ETT inserted through vocal cords under direct vision,  positive ETCO2 and breath sounds checked- equal and bilateral Secured at: 21 cm Tube secured with: Tape Dental Injury: Teeth and Oropharynx as per pre-operative assessment

## 2013-08-21 NOTE — Anesthesia Postprocedure Evaluation (Signed)
Anesthesia Post Note  Patient: Lynn Johns  Procedure(s) Performed: Procedure(s) (LRB): Lumbar Four-Five Posterior lumbar interbody fusion with Peek spacers/Pedicle screws with Posterior Lateral Arthrodesis and Fixation at Lumbar Three-Four, and Lumbar Four-Five. (N/A)  Anesthesia type: General  Patient location: PACU  Post pain: Pain level controlled  Post assessment: Patient's Cardiovascular Status Stable  Last Vitals:  Filed Vitals:   08/21/13 1128  BP: 114/62  Pulse: 68  Temp: 36.5 C  Resp: 25    Post vital signs: Reviewed and stable  Level of consciousness: alert  Complications: No apparent anesthesia complications

## 2013-08-21 NOTE — Anesthesia Preprocedure Evaluation (Signed)
Anesthesia Evaluation  Patient identified by MRN, date of birth, ID band Patient awake    Reviewed: Allergy & Precautions, H&P , NPO status , Patient's Chart, lab work & pertinent test results, reviewed documented beta blocker date and time   History of Anesthesia Complications (+) PONV  Airway Mallampati: II TM Distance: >3 FB Neck ROM: full    Dental   Pulmonary neg pulmonary ROS,  breath sounds clear to auscultation        Cardiovascular hypertension, On Medications Rhythm:regular     Neuro/Psych negative neurological ROS  negative psych ROS   GI/Hepatic negative GI ROS, Neg liver ROS,   Endo/Other  negative endocrine ROS  Renal/GU negative Renal ROS  negative genitourinary   Musculoskeletal   Abdominal   Peds  Hematology negative hematology ROS (+)   Anesthesia Other Findings See surgeon's H&P   Reproductive/Obstetrics negative OB ROS                           Anesthesia Physical Anesthesia Plan  ASA: II  Anesthesia Plan: General   Post-op Pain Management:    Induction: Intravenous  Airway Management Planned: Oral ETT  Additional Equipment:   Intra-op Plan:   Post-operative Plan: Extubation in OR  Informed Consent: I have reviewed the patients History and Physical, chart, labs and discussed the procedure including the risks, benefits and alternatives for the proposed anesthesia with the patient or authorized representative who has indicated his/her understanding and acceptance.   Dental Advisory Given  Plan Discussed with: CRNA and Surgeon  Anesthesia Plan Comments: (Pre-op eval noted cf)        Anesthesia Quick Evaluation

## 2013-08-21 NOTE — Preoperative (Signed)
Beta Blockers   Reason not to administer Beta Blockers:BB taken at 4:30am 08/21/13

## 2013-08-21 NOTE — Transfer of Care (Signed)
Immediate Anesthesia Transfer of Care Note  Patient: Lynn Johns  Procedure(s) Performed: Procedure(s) with comments: Lumbar Four-Five Posterior lumbar interbody fusion with Peek spacers/Pedicle screws with Posterior Lateral Arthrodesis and Fixation at Lumbar Three-Four, and Lumbar Four-Five. (N/A) - POSTERIOR LUMBAR FUSION 2 LEVEL  Patient Location: PACU  Anesthesia Type:General  Level of Consciousness: awake, alert  and oriented  Airway & Oxygen Therapy: Patient Spontanous Breathing and Patient connected to nasal cannula oxygen  Post-op Assessment: Report given to PACU RN, Post -op Vital signs reviewed and stable and Patient moving all extremities X 4  Post vital signs: Reviewed and stable  Complications: No apparent anesthesia complications

## 2013-08-21 NOTE — H&P (Signed)
Lynn Johns is an 62 y.o. female.   Chief Complaint: Back and bilateral leg pain. HPI: 62 y.o. White female with long standing history of back and leg pain. Initially found to have HNP at L5 S1. Then patient had decompression and fusion of this level via ALIF. She did well for a period of time. Then developed problems at L3-4. Ultimately underwent ALIF there about a year ago. Since she has had problems with recurring back and leg pain, briefly responsive to intradiscal injection at L4-5 which now shows some degeneration.  Past Medical History  Diagnosis Date  . Cystocele   . Hypertension   . Arthritis     lumbar radiculopathy   . PONV (postoperative nausea and vomiting)     needs motion sickness patch prior to surgery,pt. remarks that she had hypotension for 2 days after surgery, she thinks related to medicine from anesth., also reports allergy to VERSED & Fentanyl, damage to estachian tubes from prev. ET tube during lumbar fusion , myringotomy tubes in place since 06/2012    Past Surgical History  Procedure Laterality Date  . Tubal ligation  1986  . Abdominal hysterectomy  July 2006    with bilateral salpingo-oophorectomy  . Rotator cuff surgery      right and left   . Anterior lumbar decompression and arthrodesis      L5-S1  . Cervical disc removal  11-2011    C5 and C6  . Anterior lumbar fusion  05/19/2012    Procedure: ANTERIOR LUMBAR FUSION 1 LEVEL;  Surgeon: Barnett Abu, MD;  Location: [facility];  Service: Neurosurgery;  Laterality: N/A;  Lumbar three-fourAnterior lumbar interbody fusion  . Myringotomy  06/2012    another set since then     Family History  Problem Relation Age of Onset  . Heart disease Father   . Hypertension Father    Social History:  reports that she has never smoked. She does not have any smokeless tobacco history on file. She reports that she drinks about 0.6 ounces of alcohol per week. She reports that she does not use illicit drugs.  Allergies:   Allergies  Allergen Reactions  . Fentanyl     Rash, insomnia, uncontrollable crying  . Valium [Diazepam] Other (See Comments)    Makes her cry and be happy at the same time  . Versed [Midazolam]     Rash, insomnia, memory loss    Medications Prior to Admission  Medication Sig Dispense Refill  . atenolol (TENORMIN) 100 MG tablet Take 100 mg by mouth daily before breakfast.       . cetirizine (ZYRTEC) 10 MG tablet Take 10 mg by mouth daily before breakfast.       . HYDROcodone-acetaminophen (NORCO/VICODIN) 5-325 MG per tablet Take 1 tablet by mouth every 6 (six) hours as needed for pain.      Marland Kitchen losartan (COZAAR) 100 MG tablet Take 100 mg by mouth daily before breakfast.      . losartan-hydrochlorothiazide (HYZAAR) 100-12.5 MG per tablet Take 1 tablet by mouth daily.      Marland Kitchen spironolactone (ALDACTONE) 50 MG tablet Take 50 mg by mouth daily.        No results found for this or any previous visit (from the past 48 hour(s)). No results found.  Review of Systems  Constitutional: Negative.   HENT: Negative.   Eyes: Negative.   Respiratory: Negative.   Cardiovascular: Negative.   Gastrointestinal: Negative.   Genitourinary: Negative.   Musculoskeletal: Positive for  back pain.  Skin: Negative.   Neurological: Positive for tingling, sensory change and focal weakness.  Endo/Heme/Allergies: Negative.   Psychiatric/Behavioral: Negative.     Blood pressure 180/90, pulse 59, temperature 97.7 F (36.5 C), temperature source Oral, resp. rate 16, SpO2 100.00%. Physical Exam  Constitutional: She is oriented to person, place, and time. She appears well-developed and well-nourished.  HENT:  Head: Normocephalic and atraumatic.  Eyes: Conjunctivae and EOM are normal. Pupils are equal, round, and reactive to light.  Neck: Normal range of motion. Neck supple.  Cardiovascular: Normal rate and regular rhythm.   Respiratory: Effort normal and breath sounds normal.  GI: Soft. Bowel sounds are  normal.  Musculoskeletal: Normal range of motion.  Neurological: She is alert and oriented to person, place, and time.  Positive strait leg raising on right and left at 45 degrees.  Skin: Skin is warm and dry.  Psychiatric: She has a normal mood and affect. Her behavior is normal. Judgment and thought content normal.     Assessment/Plan Degenrative joint at L4-5 causing radicular and back pain, for decompression and fusion via posterior technique.  Zaydyn Havey J 08/21/2013, 6:21 AM

## 2013-08-21 NOTE — Progress Notes (Signed)
Orthopedic Tech Progress Note Patient Details:  Lynn Johns 1951-11-15 [id number] Spoke with patient's nurse. Patient was pre-fit for aspen lumbar fusion and daughter is to bring brace to hospital today.  Patient ID: Lynn Johns, female   DOB: Oct 28, 1951, 62 y.o.   MRN: [id number]   Lynn Johns 08/21/2013, 12:20 PM

## 2013-08-21 NOTE — Op Note (Signed)
Date of surgery: 08/21/2013 Preoperative diagnosis: Lumbar spondylosis with radiculopathy and back pain L4-L5 status post anterior lumbar interbody arthrodesis L3-4 and L5-S1 Postoperative diagnosis: Lumbar spondylosis with radiculopathy and back pain L4-L5, pseudoarthrosis L3-L4, status post arthrodesis via anterior lumbar interbody technique at L3-4 and L5-S1 Procedure: L4-L5 decompressive laminectomy decompression of L4 and L5 nerve roots, posterior lumbar interbody arthrodesis with peek spacers local autograft and allograft, pedicle screw fixation L3-L5, posterior lateral arthrodesis L3-L5  Surgeon: Barnett Abu M.D.  Asst.: Maeola Harman M.D..  Indications: Patient is Lynn Johns is a 62 y.o. female who who's had significant back pain and lumbar radiculopathy for nearly a years period time. A lumbar myelogram demonstrates  spondylolisthesis with lateral recess stenosis. she was advised regarding surgical intervention. The patient has previously had anterior lumbar interbody arthrodesis at L3-4 and L5-S1. Vacuum disc phenomenon is also observed at L4-L5.  Procedure: The patient was brought to the operating room supine on a stretcher. After the smooth induction of general endotracheal anesthesia she was turned prone and the back was prepped with alcohol and DuraPrep. The back was then draped sterilely. A midline incision was created and carried down to the lumbar dorsal fascia. A localizing radiograph identified the L4 and L5 spinous processes. A subligamentous dissection was created at L4 and L5 to expose the interlaminar space at L4 and L5 and the facet joints over the L4-L5 interspace. Further evaluation identified gross motion between L3 and L4 and this confirmed the presence of a pseudoarthrosis at this level. It was then felt that in addition to decompressing and stabilizing L4-L5 posterior lateral arthrodesis with fixation from L3-L5 would be performed. Laminotomies were were then created  removing the entire inferior margin of the lamina of L4 including the inferior facet at the L4-L5 joint. The yellow ligament was taken up and the common dural tube was exposed along with the L4 nerve root superiorly, and the L5 nerve root inferiorly, the disc space was exposed and epidural veins in this region were cauterized and divided. The L4 nerve roots and the L5 nerve root were dissected with care taken to protect them. The disc space was opened and a combination of curettes and rongeurs was used to evacuate the disc space fully. The endplates were removed using sharp curettes. An interbody spacer was placed to distract the disc space while the contralateral discectomy was performed. When the entirety of the disc was removed and the endplates were prepared final sizing of the disc space was obtained 13 mm peek spacers were chosen and packed with autograft and allograft and placed into the interspace. The remainder of the interspace was packed with autograft and allograft. Pedicle entry sites were then chosen using fluoroscopic guidance and 6.5 x 40 mm screws were placed in L4 and 6.5 x 40 mm screws were placed in L5. Then 6.5 x 40 mm screws were placed in L3 The lateral gutters were decorticated and graft was packed in the posterolateral gutters between L3 and L5. Final radiographs were obtained after placing appropriately sized rods between the pedicle screws at L4-L5 and torquing these to the appropriate tension. The surgical site was inspected carefully to assure the L4 and L5 nerve roots were well decompressed, hemostasis was obtained, and the graft was well packed. Then the retractors were removed and the wound was closed with #1 Vicryl in the lumbar dorsal fascia 2-0 Vicryl in the subcutaneous tissue and 3-0 Vicryl subcuticularly. When he cc of half percent Marcaine was injected into the paraspinous  musculature at the time of closure. Blood loss was estimated at 250 cc. The patient tolerated procedure  well and was returned to the recovery room in stable condition.

## 2013-08-21 NOTE — Progress Notes (Signed)
Patient ID: Lynn Johns, female   DOB: 1951-01-23, 62 y.o.   MRN: [id number] I will signs are stable. Alert oriented feels well no leg pain. Pleased with the postoperative course thus far.

## 2013-08-22 MED ORDER — LIDOCAINE VISCOUS 2 % MT SOLN
15.0000 mL | Freq: Once | OROMUCOSAL | Status: DC
  Filled 2013-08-22: qty 15

## 2013-08-22 MED ORDER — LIDOCAINE HCL 2 % EX GEL: Freq: Once | CUTANEOUS | Status: DC

## 2013-08-22 MED ORDER — TAMSULOSIN HCL 0.4 MG PO CAPS
0.4000 mg | ORAL_CAPSULE | Freq: Every day | ORAL | Status: DC
  Administered 2013-08-22 – 2013-08-23 (×2): 0.4 mg via ORAL
  Filled 2013-08-22 (×2): qty 1

## 2013-08-22 MED FILL — Sodium Chloride IV Soln 0.9%: INTRAVENOUS | Qty: 1000 | Status: AC

## 2013-08-22 MED FILL — Heparin Sodium (Porcine) Inj 1000 Unit/ML: INTRAMUSCULAR | Qty: 30 | Status: AC

## 2013-08-22 NOTE — Progress Notes (Signed)
Pt foley discontinued at 1530 yesterday. Pt able to void after foley was discontinued. At around 2230, Pt was concerned that she's not making any urine. Bladder scan done and it was 56 ml. At 0323, pt was not able to urinate and bladder scan showed 526 ml. This nurse and Norlen,RN attempted to do in and out cath but was unsuccessful. Pt is also a little bit confused. Pt able to urinate; Dr. Danielle Dess aware and ordered to do post void bladder scan. Post void bladder scan done and it was 618 ml. Dr. Danielle Dess aware and ordered flomax and to insert a foley. Will continue to monitor pt.

## 2013-08-22 NOTE — Plan of Care (Signed)
Foley cathether placed. 14 F. Pt tolerated well.

## 2013-08-22 NOTE — Evaluation (Signed)
Occupational Therapy Evaluation Patient Details Name: Lynn Johns MRN: [id number] DOB: October 18, 1951 Today's Date: 08/22/2013 Time: 6295-2841 OT Time Calculation (min): 13 min  OT Assessment / Plan / Recommendation History of present illness 62 yo female s/p Lumbar Four-Five Posterior lumbar interbody fusion with Peek spacers/Pedicle screws with Posterior Lateral Arthrodesis and Fixation at Lumbar Three-Four, and Lumbar Four-Five.    Clinical Impression   Patient evaluated by Occupational Therapy with no further acute OT needs identified. All education has been completed and the patient has no further questions. See below for any follow-up Occupational Therapy or equipment needs. OT to sign off. Thank you for referral.      OT Assessment  Patient does not need any further OT services    Follow Up Recommendations  No OT follow up    Barriers to Discharge      Equipment Recommendations  None recommended by OT    Recommendations for Other Services    Frequency       Precautions / Restrictions Precautions Precautions: Back Required Braces or Orthoses: Spinal Brace Spinal Brace: Lumbar corset;Applied in sitting position   Pertinent Vitals/Pain none    ADL  Eating/Feeding: Independent Where Assessed - Eating/Feeding: Chair Grooming: Wash/dry hands;Wash/dry face;Independent Where Assessed - Grooming: Unsupported standing Upper Body Dressing: Independent Where Assessed - Upper Body Dressing: Unsupported sit to stand Lower Body Dressing: Independent Where Assessed - Lower Body Dressing: Unsupported standing (single leg standing and touching toes) Toilet Transfer: Independent Toilet Transfer Method: Sit to Barista: Regular height toilet Tub/Shower Transfer: Simulated;Modified independent Tub/Shower Transfer Method: Science writer: Walk in shower Equipment Used: Back brace;Gait belt Transfers/Ambulation Related to ADLs: Pt  ambulating without deficits or balance deficits ADL Comments: pt with all adl education complete and handout provided. No furthe rOT needed. Pt is independent. pt 's spouse is an OT and can provide support at d/c home.    OT Diagnosis:    OT Problem List:   OT Treatment Interventions:     OT Goals(Current goals can be found in the care plan section)    Visit Information  Last OT Received On: 08/22/13 Assistance Needed: +1 PT/OT Co-Evaluation/Treatment: Yes History of Present Illness: 62 yo female s/p Lumbar Four-Five Posterior lumbar interbody fusion with Peek spacers/Pedicle screws with Posterior Lateral Arthrodesis and Fixation at Lumbar Three-Four, and Lumbar Four-Five.        Prior Functioning     Home Living Family/patient expects to be discharged to:: Private residence Living Arrangements: Spouse/significant other Available Help at Discharge: Family Type of Home: House Additional Comments: full flight of steps to get up to bedroom Prior Function Level of Independence: Independent Comments: pre-school teacher Communication Communication: No difficulties Dominant Hand: Right         Vision/Perception Vision - History Baseline Vision: No visual deficits Patient Visual Report: No change from baseline   Cognition  Cognition Arousal/Alertness: Awake/alert Behavior During Therapy: WFL for tasks assessed/performed Overall Cognitive Status: Within Functional Limits for tasks assessed    Extremity/Trunk Assessment Upper Extremity Assessment Upper Extremity Assessment: Overall WFL for tasks assessed Lower Extremity Assessment Lower Extremity Assessment: Overall WFL for tasks assessed Cervical / Trunk Assessment Cervical / Trunk Assessment: Normal     Mobility Bed Mobility Bed Mobility: Not assessed Details for Bed Mobility Assistance: pt verbally described safe technique for log roll Transfers Transfers: Sit to Stand;Stand to Sit Sit to Stand: 6: Modified  independent (Device/Increase time);With upper extremity assist;From chair/3-in-1 Stand to Sit: 6: Modified independent (  Device/Increase time);With upper extremity assist;To chair/3-in-1     Exercise     Balance     End of Session OT - End of Session Activity Tolerance: Patient tolerated treatment well Patient left:  (with PT ambulating) Nurse Communication: Precautions  GO     Harolyn Rutherford 08/22/2013, 4:31 PM Pager: [phone]

## 2013-08-22 NOTE — Evaluation (Signed)
Physical Therapy Evaluation Patient Details Name: Lynn Johns MRN: [id number] DOB: 10-31-1951 Today's Date: 08/22/2013 Time: 9629-5284 PT Time Calculation (min): 17 min  PT Assessment / Plan / Recommendation History of Present Illness  62 yo female s/p Lumbar Four-Five Posterior lumbar interbody fusion with Peek spacers/Pedicle screws with Posterior Lateral Arthrodesis and Fixation at Lumbar Three-Four, and Lumbar Four-Five.   Clinical Impression  Presents to PT moving well following above surgery. Education provided on safe mobility techniques with regards to precautions. Patient able to verbalize and demonstrate safe mobility. No further acute PT needs at this time. Will f/u with neuro surgeon to determine when safe to start OPPT.     PT Assessment  Patent does not need any further PT services    Follow Up Recommendations  No PT follow up    Does the patient have the potential to tolerate intense rehabilitation      Barriers to Discharge        Equipment Recommendations  None recommended by PT    Recommendations for Other Services     Frequency      Precautions / Restrictions Precautions Precautions: Back Required Braces or Orthoses: Spinal Brace Spinal Brace: Lumbar corset;Applied in sitting position   Pertinent Vitals/Pain Denies pain      Mobility  Bed Mobility Bed Mobility: Not assessed Details for Bed Mobility Assistance: pt verbally described safe technique for log roll Transfers Transfers: Sit to Stand;Stand to Sit Sit to Stand: 6: Modified independent (Device/Increase time);With upper extremity assist;From chair/3-in-1 Stand to Sit: 6: Modified independent (Device/Increase time);With upper extremity assist;To chair/3-in-1 Ambulation/Gait Ambulation/Gait Assistance: 5: Supervision;6: Modified independent (Device/Increase time) Ambulation Distance (Feet): 500 Feet Ambulation/Gait Assistance Details: initially supervision for stability and cues for posture,  progressing to modified independence Gait Pattern: Step-through pattern Stairs: Yes Stairs Assistance: 5: Supervision Stair Management Technique: One rail Right;Step to pattern;Forwards Number of Stairs: 5       PT Goals(Current goals can be found in the care plan section) Acute Rehab PT Goals PT Goal Formulation: No goals set, d/c therapy  Visit Information  Last PT Received On: 08/22/13 Assistance Needed: +1 PT/OT Co-Evaluation/Treatment: Yes History of Present Illness: 62 yo female s/p Lumbar Four-Five Posterior lumbar interbody fusion with Peek spacers/Pedicle screws with Posterior Lateral Arthrodesis and Fixation at Lumbar Three-Four, and Lumbar Four-Five.        Prior Functioning  Home Living Family/patient expects to be discharged to:: Private residence Living Arrangements: Spouse/significant other Available Help at Discharge: Family Type of Home: House Additional Comments: full flight of steps to get up to bedroom Prior Function Level of Independence: Independent Comments: pre-school teacher Communication Communication: No difficulties Dominant Hand: Right    Cognition  Cognition Arousal/Alertness: Awake/alert Behavior During Therapy: WFL for tasks assessed/performed Overall Cognitive Status: Within Functional Limits for tasks assessed    Extremity/Trunk Assessment Upper Extremity Assessment Upper Extremity Assessment: Overall WFL for tasks assessed Lower Extremity Assessment Lower Extremity Assessment: Overall WFL for tasks assessed   Balance    End of Session PT - End of Session Equipment Utilized During Treatment: Gait belt Activity Tolerance: Patient tolerated treatment well Patient left: in chair;with call bell/phone within reach Nurse Communication: Mobility status  GP     Ludger Nutting 08/22/2013, 3:58 PM

## 2013-08-22 NOTE — Progress Notes (Signed)
Subjective: Patient reports Unable to void and feels somewhat queasy today. Also notes dryness of mouth.  Objective: Vital signs in last 24 hours: Temp:  [97.4 F (36.3 C)-98.4 F (36.9 C)] 98 F (36.7 C) (09/10 0521) Pulse Rate:  [60-88] 81 (09/10 0521) Resp:  [18-25] 18 (09/10 0521) BP: (99-115)/(54-74) 114/62 mmHg (09/10 0521) SpO2:  [95 %-100 %] 97 % (09/10 0521) Weight:  [65.772 kg (145 lb)] 65.772 kg (145 lb) (09/09 1135)  Intake/Output from previous day: 09/09 0701 - 09/10 0700 In: 1375 [I.V.:1375] Out: 525 [Urine:425; Blood:100] Intake/Output this shift:    Incision is clean and dry motor function is good.  Lab Results: No results found for this basename: WBC, HGB, HCT, PLT,  in the last 72 hours BMET No results found for this basename: NA, K, CL, CO2, GLUCOSE, BUN, CREATININE, CALCIUM,  in the last 72 hours  Studies/Results: Dg Lumbar Spine 2-3 Views  08/21/2013   CLINICAL DATA:  L3-4, L4-5 posterior fusion.  EXAM: LUMBAR SPINE - 2-3 VIEW  COMPARISON:  Intraoperative imaging 08/21/2013  FINDINGS: Remote changes of prior anterior fusion at L3-4 and L5-S1. Posterior fusion changes from L3-L5. Normal alignment. No hardware complicating feature.  IMPRESSION: L3-L5 posterior fusion.   Electronically Signed   By: Charlett Nose M.D.   On: 08/21/2013 11:44   Dg Lumbar Spine 2-3 Views  08/21/2013   CLINICAL DATA:  L3-4, L4-5 posterior fusion.  EXAM: LUMBAR SPINE - 2-3 VIEW  COMPARISON:  CT 04/10/2013  FINDINGS: Remote changes of anterior fusion at L3-4 and L5-S1. 1st lateral intraoperative image demonstrates a posterior needle directed at the L4-5 level. Second lateral intraoperative image demonstrates posterior surgical instruments at the L4-5 level. Third lateral intraoperative image demonstrates posterior instruments directed at the L4 level. Surgical instrument also overlies the L5 vertebral body.  IMPRESSION: Intraoperative localization as above.   Electronically Signed   By:  Charlett Nose M.D.   On: 08/21/2013 10:45    Assessment/Plan: Stable postop. Anticholinergic effects likely due to scopolamine patch. Will remove patch  LOS: 1 day  Remove scopolamine patch leave Foley in until this afternoon.   Kinberly Perris J 08/22/2013, 8:54 AM

## 2013-08-23 MED ORDER — HYDROCODONE-ACETAMINOPHEN 5-325 MG PO TABS: 1.0000 | ORAL_TABLET | ORAL | Status: DC | PRN

## 2013-08-23 NOTE — Progress Notes (Signed)
UR complete.  Bevelyn Arriola RN, MSN 

## 2013-08-23 NOTE — Discharge Summary (Signed)
Physician Discharge Summary  Patient ID: Lynn Johns MRN: [id number] DOB/AGE: January 15, 1951 62 y.o.  Admit date: 08/21/2013 Discharge date: 08/23/2013  Admission Diagnoses: Spondylosis L4-L5 with degenerative spondylolisthesis, pseudoarthrosis L3-L4  Discharge Diagnoses: Spondylosis L4-L5 with degenerative spondylolisthesis, pseudoarthrosis L3-L4, lumbar radiculopathy, urinary retention Active Problems:   * No active hospital problems. *   Discharged Condition: good  Hospital Course: Patient was admitted to undergo surgical decompression and stabilization of L4-L5. During the procedure was noted that she had a pseudoarthrosis at L3-L4. Arthrodesis was extended from L3-L5 with posterior instrumentation placed from L3-4 to L4-5. Instrumentation with segmental. She tolerated procedure well. She did have some postoperative urinary retention secondary to the effects of scopolamine. This resolved when the scopolamine patch was removed.  Consults: None  Significant Diagnostic Studies: None  Treatments: surgery: Decompression L4-L5 with posterior interbody arthrodesis pedicle screw fixation L4-L5. Segmental fixation L3-L5 with posterior lateral arthrodesis L3-L5  Discharge Exam: Blood pressure 96/68, pulse 95, temperature 98.8 F (37.1 C), temperature source Oral, resp. rate 20, height 5\' 5"  (1.651 m), weight 65.772 kg (145 lb), SpO2 96.00%. Incision is clean and dry motor function is intact in lower extremities.  Disposition: 01-Home or Self Care  Discharge Orders   Future Orders Complete By Expires   Call MD for:  redness, tenderness, or signs of infection (pain, swelling, redness, odor or green/yellow discharge around incision site)  As directed    Call MD for:  severe uncontrolled pain  As directed    Call MD for:  temperature >100.4  As directed    Diet - low sodium heart healthy  As directed    Discharge instructions  As directed    Comments:     Okay to shower. Do not apply salves  or appointments to incision. No heavy lifting with the upper extremities greater than 15 pounds. May resume driving when not requiring pain medication and patient feels comfortable with doing so.   Increase activity slowly  As directed        Medication List         atenolol 100 MG tablet  Commonly known as:  TENORMIN  Take 100 mg by mouth daily before breakfast.     cetirizine 10 MG tablet  Commonly known as:  ZYRTEC  Take 10 mg by mouth daily before breakfast.     HYDROcodone-acetaminophen 5-325 MG per tablet  Commonly known as:  NORCO  Take 1-2 tablets by mouth every 4 (four) hours as needed for pain.     HYDROcodone-acetaminophen 5-325 MG per tablet  Commonly known as:  NORCO/VICODIN  Take 1 tablet by mouth every 6 (six) hours as needed for pain.     losartan 100 MG tablet  Commonly known as:  COZAAR  Take 100 mg by mouth daily before breakfast.     spironolactone 50 MG tablet  Commonly known as:  ALDACTONE  Take 50 mg by mouth daily.         SignedStefani Dama 08/23/2013, 8:45 AM

## 2013-11-26 ENCOUNTER — Other Ambulatory Visit: Payer: Self-pay

## 2013-11-26 DIAGNOSIS — Z1231 Encounter for screening mammogram for malignant neoplasm of breast: Secondary | ICD-10-CM

## 2014-01-07 ENCOUNTER — Ambulatory Visit
Admission: RE | Admit: 2014-01-07 | Discharge: 2014-01-07 | Disposition: A | Discharge status: Home / Self Care | Payer: BC Managed Care – PPO | Source: Ambulatory Visit

## 2014-01-07 DIAGNOSIS — Z1231 Encounter for screening mammogram for malignant neoplasm of breast: Secondary | ICD-10-CM

## 2014-02-23 ENCOUNTER — Other Ambulatory Visit: Payer: Self-pay | Admitting: Ophthalmology

## 2014-02-23 NOTE — H&P (Signed)
  Date of examination:  02-14-14  Indication for surgery: to straighten the eyes and allow some binocularity  Pertinent past medical history:  Past Medical History  Diagnosis Date  . Cystocele   . Hypertension   . Arthritis     lumbar radiculopathy   . PONV (postoperative nausea and vomiting)     needs motion sickness patch prior to surgery,pt. remarks that she had hypotension for 2 days after surgery, she thinks related to medicine from anesth., also reports allergy to VERSED & Fentanyl, damage to estachian tubes from prev. ET tube during lumbar fusion , myringotomy tubes in place since 06/2012    Pertinent ocular history:  Right eye drifts out since teens.  Recently having increasinig difficulty focusing  Pertinent family history:  Family History  Problem Relation Age of Onset  . Heart disease Father   . Hypertension Father     General:  Healthy appearing patient in no distress.    Eyes:    Acuity cc OD 20/20  OS 20/20  External: Within normal limits     Anterior segment: mod NS    Motility: cc  X(T)=18 comitant.  [place] XT = 22, X(T) 20.  Trial of prism 10 is best 12 OK  Fundus: deferred  Refraction:  low minus OU  Heart: Regular rate and rhythm without murmur     Lungs: Clear to auscultation     Abdomen: Soft, nontender, normal bowel sounds     Impression:Exotropia.  Does not accept full angle correction with prism  Plan:  RLR recess  Kingdom Vanzanten O

## 2014-02-25 ENCOUNTER — Encounter (HOSPITAL_BASED_OUTPATIENT_CLINIC_OR_DEPARTMENT_OTHER): Payer: Self-pay | Admitting: *Deleted

## 2014-02-25 NOTE — Progress Notes (Signed)
Pt had recent labs pcp-called-had lumb surg 8/14-ekg done

## 2014-03-01 ENCOUNTER — Encounter (HOSPITAL_BASED_OUTPATIENT_CLINIC_OR_DEPARTMENT_OTHER): Payer: Self-pay | Admitting: Anesthesiology

## 2014-03-01 ENCOUNTER — Ambulatory Visit (HOSPITAL_BASED_OUTPATIENT_CLINIC_OR_DEPARTMENT_OTHER)
Admission: RE | Admit: 2014-03-01 | Discharge: 2014-03-01 | Disposition: A | Discharge status: Home / Self Care | Payer: BC Managed Care – PPO | Source: Ambulatory Visit | Attending: Ophthalmology | Admitting: Ophthalmology

## 2014-03-01 ENCOUNTER — Encounter (HOSPITAL_BASED_OUTPATIENT_CLINIC_OR_DEPARTMENT_OTHER): Payer: BC Managed Care – PPO | Admitting: Anesthesiology

## 2014-03-01 ENCOUNTER — Encounter (HOSPITAL_BASED_OUTPATIENT_CLINIC_OR_DEPARTMENT_OTHER)
Admission: RE | Disposition: A | Discharge status: Home / Self Care | Payer: Self-pay | Source: Ambulatory Visit | Attending: Ophthalmology

## 2014-03-01 ENCOUNTER — Ambulatory Visit (HOSPITAL_BASED_OUTPATIENT_CLINIC_OR_DEPARTMENT_OTHER): Payer: BC Managed Care – PPO | Admitting: Anesthesiology

## 2014-03-01 DIAGNOSIS — H5010 Unspecified exotropia: Secondary | ICD-10-CM | POA: Insufficient documentation

## 2014-03-01 DIAGNOSIS — I10 Essential (primary) hypertension: Secondary | ICD-10-CM | POA: Insufficient documentation

## 2014-03-01 HISTORY — PX: STRABISMUS SURGERY: SHX218

## 2014-03-01 LAB — POCT HEMOGLOBIN-HEMACUE: Hemoglobin: 17 g/dL — ABNORMAL HIGH (ref 12.0–15.0)

## 2014-03-01 SURGERY — REPAIR STRABISMUS
Anesthesia: General | Site: Eye | Laterality: Right

## 2014-03-01 MED ORDER — LACTATED RINGERS IV SOLN
INTRAVENOUS | Status: DC
  Administered 2014-03-01: 11:00:00 via INTRAVENOUS

## 2014-03-01 MED ORDER — LACTATED RINGERS IV SOLN
INTRAVENOUS | Status: DC | PRN
  Administered 2014-03-01: 12:00:00 via INTRAVENOUS

## 2014-03-01 MED ORDER — MORPHINE SULFATE 2 MG/ML IJ SOLN: 1.0000 mg | INTRAMUSCULAR | Status: DC | PRN

## 2014-03-01 MED ORDER — PROMETHAZINE HCL 25 MG/ML IJ SOLN: 6.2500 mg | INTRAMUSCULAR | Status: DC | PRN

## 2014-03-01 MED ORDER — PROPOFOL 10 MG/ML IV BOLUS
INTRAVENOUS | Status: DC | PRN
  Administered 2014-03-01: 160 mg via INTRAVENOUS

## 2014-03-01 MED ORDER — LIDOCAINE HCL (CARDIAC) 20 MG/ML IV SOLN
INTRAVENOUS | Status: DC | PRN
  Administered 2014-03-01: 50 mg via INTRAVENOUS

## 2014-03-01 MED ORDER — OXYCODONE HCL 5 MG PO TABS: 5.0000 mg | ORAL_TABLET | Freq: Once | ORAL | Status: DC | PRN

## 2014-03-01 MED ORDER — DEXAMETHASONE SODIUM PHOSPHATE 4 MG/ML IJ SOLN
INTRAMUSCULAR | Status: DC | PRN
  Administered 2014-03-01: 10 mg via INTRAVENOUS

## 2014-03-01 MED ORDER — BSS IO SOLN
INTRAOCULAR | Status: DC | PRN
  Administered 2014-03-01: 10 mL via INTRAOCULAR

## 2014-03-01 MED ORDER — OXYCODONE HCL 5 MG/5ML PO SOLN: 5.0000 mg | Freq: Once | ORAL | Status: DC | PRN

## 2014-03-01 MED ORDER — MORPHINE SULFATE 10 MG/ML IJ SOLN
INTRAMUSCULAR | Status: DC | PRN
  Administered 2014-03-01: 2 mg via INTRAVENOUS

## 2014-03-01 MED ORDER — FENTANYL CITRATE 0.05 MG/ML IJ SOLN
INTRAMUSCULAR | Status: AC
  Filled 2014-03-01: qty 4

## 2014-03-01 MED ORDER — TOBRAMYCIN-DEXAMETHASONE 0.3-0.1 % OP OINT
TOPICAL_OINTMENT | OPHTHALMIC | Status: DC | PRN
  Administered 2014-03-01: 1 via OPHTHALMIC

## 2014-03-01 MED ORDER — KETOROLAC TROMETHAMINE 15 MG/ML IJ SOLN
INTRAMUSCULAR | Status: DC | PRN
  Administered 2014-03-01: 30 mg via INTRAVENOUS

## 2014-03-01 MED ORDER — ATROPINE SULFATE 0.4 MG/ML IJ SOLN
INTRAMUSCULAR | Status: DC | PRN
  Administered 2014-03-01: .2 mg via INTRAVENOUS

## 2014-03-01 MED ORDER — MIDAZOLAM HCL 2 MG/2ML IJ SOLN
INTRAMUSCULAR | Status: AC
  Filled 2014-03-01: qty 2

## 2014-03-01 MED ORDER — MORPHINE SULFATE 10 MG/ML IJ SOLN
INTRAMUSCULAR | Status: AC
  Filled 2014-03-01: qty 1

## 2014-03-01 SURGICAL SUPPLY — 29 items
APL SRG 3 HI ABS STRL LF PLS (MISCELLANEOUS) ×1
APPLICATOR COTTON TIP 6IN STRL (MISCELLANEOUS) ×8 IMPLANT
APPLICATOR DR MATTHEWS STRL (MISCELLANEOUS) ×2 IMPLANT
BANDAGE EYE OVAL (MISCELLANEOUS) ×4 IMPLANT
CAUTERY EYE LOW TEMP 1300F FIN (OPHTHALMIC RELATED) IMPLANT
COVER MAYO STAND STRL (DRAPES) ×2 IMPLANT
COVER TABLE BACK 60X90 (DRAPES) ×2 IMPLANT
DRAPE SURG 17X23 STRL (DRAPES) ×4 IMPLANT
DRAPE U-SHAPE 76X120 STRL (DRAPES) IMPLANT
GLOVE BIO SURGEON STRL SZ 6.5 (GLOVE) ×2 IMPLANT
GLOVE BIOGEL M STRL SZ7.5 (GLOVE) ×4 IMPLANT
GOWN STRL REUS W/ TWL LRG LVL3 (GOWN DISPOSABLE) ×1 IMPLANT
GOWN STRL REUS W/TWL LRG LVL3 (GOWN DISPOSABLE) ×2
GOWN STRL REUS W/TWL XL LVL3 (GOWN DISPOSABLE) ×2 IMPLANT
NS IRRIG 1000ML POUR BTL (IV SOLUTION) ×2 IMPLANT
PACK BASIN DAY SURGERY FS (CUSTOM PROCEDURE TRAY) ×2 IMPLANT
SHEET MEDIUM DRAPE 40X70 STRL (DRAPES) IMPLANT
SPEAR EYE SURG WECK-CEL (MISCELLANEOUS) ×4 IMPLANT
STRIP CLOSURE SKIN 1/4X4 (GAUZE/BANDAGES/DRESSINGS) IMPLANT
SUT 6 0 SILK T G140 8DA (SUTURE) IMPLANT
SUT MERSILENE 6 0 S14 DA (SUTURE) IMPLANT
SUT PLAIN 6 0 TG1408 (SUTURE) ×2 IMPLANT
SUT SILK 4 0 C 3 735G (SUTURE) IMPLANT
SUT VICRYL 6 0 S 28 (SUTURE) IMPLANT
SUT VICRYL ABS 6-0 S29 18IN (SUTURE) ×2 IMPLANT
SYRINGE 10CC LL (SYRINGE) ×2 IMPLANT
TOWEL OR 17X24 6PK STRL BLUE (TOWEL DISPOSABLE) ×2 IMPLANT
TOWEL OR NON WOVEN STRL DISP B (DISPOSABLE) ×2 IMPLANT
TRAY DSU PREP LF (CUSTOM PROCEDURE TRAY) ×2 IMPLANT

## 2014-03-01 NOTE — Interval H&P Note (Signed)
History and Physical Interval Note:  03/01/2014 11:22 AM  Lynn Johns  has presented today for surgery, with the diagnosis of EXOTROPIA RIGHT   The various methods of treatment have been discussed with the patient and family. After consideration of risks, benefits and other options for treatment, the patient has consented to  Procedure(s): REPAIR STRABISMUS RIGHT EYE  (Right) as a surgical intervention .  The patient's history has been reviewed, patient examined, no change in status, stable for surgery.  I have reviewed the patient's chart and labs.  Questions were answered to the patient's satisfaction.     Derry Skill

## 2014-03-01 NOTE — Anesthesia Procedure Notes (Signed)
Procedure Name: LMA Insertion Performed by: Kristinia Leavy W Pre-anesthesia Checklist: Patient identified, Timeout performed, Emergency Drugs available, Suction available and Patient being monitored Patient Re-evaluated:Patient Re-evaluated prior to inductionOxygen Delivery Method: Circle system utilized Preoxygenation: Pre-oxygenation with 100% oxygen Intubation Type: IV induction Ventilation: Mask ventilation without difficulty LMA: LMA flexible inserted LMA Size: 4.0 Tube type: Oral Number of attempts: 1 Placement Confirmation: positive ETCO2 Tube secured with: Tape Dental Injury: Teeth and Oropharynx as per pre-operative assessment      

## 2014-03-01 NOTE — Anesthesia Preprocedure Evaluation (Signed)
Anesthesia Evaluation  Patient identified by MRN, date of birth, ID band Patient awake    Reviewed: Allergy & Precautions, H&P , NPO status , Patient's Chart, lab work & pertinent test results  History of Anesthesia Complications (+) PONV  Airway Mallampati: I      Dental  (+) Teeth Intact   Pulmonary  breath sounds clear to auscultation        Cardiovascular hypertension, Rhythm:Regular Rate:Normal     Neuro/Psych    GI/Hepatic   Endo/Other    Renal/GU      Musculoskeletal   Abdominal   Peds  Hematology   Anesthesia Other Findings   Reproductive/Obstetrics                           Anesthesia Physical Anesthesia Plan  ASA: II  Anesthesia Plan: General   Post-op Pain Management:    Induction: Intravenous  Airway Management Planned: LMA  Additional Equipment:   Intra-op Plan:   Post-operative Plan: Extubation in OR  Informed Consent: I have reviewed the patients History and Physical, chart, labs and discussed the procedure including the risks, benefits and alternatives for the proposed anesthesia with the patient or authorized representative who has indicated his/her understanding and acceptance.   Dental advisory given  Plan Discussed with: CRNA and Surgeon  Anesthesia Plan Comments:         Anesthesia Quick Evaluation

## 2014-03-01 NOTE — Op Note (Signed)
03/01/2014  12:22 PM  PATIENT:  Lynn Johns  64 y.o. female  PRE-OPERATIVE DIAGNOSIS:  Exotropia      POST-OPERATIVE DIAGNOSIS:  Exotropia     PROCEDURE:  Lateral rectus muscle recession 7.0 mm right eye  SURGEON:  Lorne Skeens.Lynn Johns, M.D.   ANESTHESIA:   general  COMPLICATIONS:None  DESCRIPTION OF PROCEDURE: The patient was taken to the operating room where She was identified by me. General anesthesia was induced without difficulty after placement of appropriate monitors. The patient was prepped and draped in standard sterile fashion. A lid speculum was placed in the right eye.  Through an inferotemporal fornix incision through conjunctiva and Tenon's fascia, the right lateral rectus muscle was engaged on a series of muscle hooks and cleared of its fascial attachments. The tendon was secured with a double-armed 6-0 Vicryl suture with a double locking bite at each border of the muscle, 1 mm from the insertion. The muscle was disinserted, and was reattached to sclera at a measured distance of 7.0 millimeters posterior to the original insertion, using direct scleral passes in crossed swords fashion.  The suture ends were tied securely after the position of the muscle had been checked and found to be accurate. Conjunctiva was closed with 2 6-0 plain gut sutures.  TobraDex ointment was placed in each eye. The patient was awakened without difficulty and taken to the recovery room in stable condition, having suffered no intraoperative or immediate postoperative complications.  Lorne Skeens. Lynn Johns M.D.    PATIENT DISPOSITION:  PACU - hemodynamically stable.

## 2014-03-01 NOTE — Discharge Instructions (Signed)
Diet: Clear liquids, advance to soft foods then regular diet as tolerated.  Pain control:   1)  Ibuprofen 600 mg by mouth every 6-8 hours as needed for pain  2)  Use your own hydrocodone as prescribed as needed for pain not relieved by ibuprofen  Eye medications:  Tobradex or Zylet eye drops, one drop in the operated eye(s) 3 times a day for 10 days.  If the surgical center also gave you a tube of Tobradex or Zylet ointment to take home, you may put 1/2 inch of this in the operated eye(s) at bedtime, in addition to the drops.  Activity: No swimming for 1 week.  It is OK to let water run over the face and eyes while showering or taking a bath, even during the first week.  No other restriction on exercise or activity.  Call Dr. Janee Morn office [phone] with any problems or concerns.   Post Anesthesia Home Care Instructions  Activity: Get plenty of rest for the remainder of the day. A responsible adult should stay with you for 24 hours following the procedure.  For the next 24 hours, DO NOT: -Drive a car -Paediatric nurse -Drink alcoholic beverages -Take any medication unless instructed by your physician -Make any legal decisions or sign important papers.  Meals: Start with liquid foods such as gelatin or soup. Progress to regular foods as tolerated. Avoid greasy, spicy, heavy foods. If nausea and/or vomiting occur, drink only clear liquids until the nausea and/or vomiting subsides. Call your physician if vomiting continues.  Special Instructions/Symptoms: Your throat may feel dry or sore from the anesthesia or the breathing tube placed in your throat during surgery. If this causes discomfort, gargle with warm salt water. The discomfort should disappear within 24 hours.

## 2014-03-01 NOTE — Anesthesia Postprocedure Evaluation (Signed)
  Anesthesia Post-op Note  Patient: Lynn Johns  Procedure(s) Performed: Procedure(s): REPAIR STRABISMUS RIGHT EYE  (Right)  Patient Location: PACU  Anesthesia Type:General  Level of Consciousness: awake and alert   Airway and Oxygen Therapy: Patient Spontanous Breathing  Post-op Pain: mild  Post-op Assessment: Post-op Vital signs reviewed  Post-op Vital Signs: stable  Complications: No apparent anesthesia complications

## 2014-03-01 NOTE — Transfer of Care (Signed)
Immediate Anesthesia Transfer of Care Note  Patient: Lynn Johns  Procedure(s) Performed: Procedure(s): REPAIR STRABISMUS RIGHT EYE  (Right)  Patient Location: PACU  Anesthesia Type:General  Level of Consciousness: awake, alert  and oriented  Airway & Oxygen Therapy: Patient Spontanous Breathing and Patient connected to face mask oxygen  Post-op Assessment: Report given to PACU RN and Post -op Vital signs reviewed and stable  Post vital signs: Reviewed and stable  Complications: No apparent anesthesia complications

## 2014-03-01 NOTE — H&P (View-Only) (Signed)
  Date of examination:  02-14-14  Indication for surgery: to straighten the eyes and allow some binocularity  Pertinent past medical history:  Past Medical History  Diagnosis Date  . Cystocele   . Hypertension   . Arthritis     lumbar radiculopathy   . PONV (postoperative nausea and vomiting)     needs motion sickness patch prior to surgery,pt. remarks that she had hypotension for 2 days after surgery, she thinks related to medicine from anesth., also reports allergy to VERSED & Fentanyl, damage to estachian tubes from prev. ET tube during lumbar fusion , myringotomy tubes in place since 06/2012    Pertinent ocular history:  Right eye drifts out since teens.  Recently having increasinig difficulty focusing  Pertinent family history:  Family History  Problem Relation Age of Onset  . Heart disease Father   . Hypertension Father     General:  Healthy appearing patient in no distress.    Eyes:    Acuity cc OD 20/20  OS 20/20  External: Within normal limits     Anterior segment: mod NS    Motility: cc  X(T)=18 comitant.  [place] XT = 22, X(T) 20.  Trial of prism 10 is best 12 OK  Fundus: deferred  Refraction:  low minus OU  Heart: Regular rate and rhythm without murmur     Lungs: Clear to auscultation     Abdomen: Soft, nontender, normal bowel sounds     Impression:Exotropia.  Does not accept full angle correction with prism  Plan:  RLR recess  Silvester Reierson O

## 2014-03-04 ENCOUNTER — Encounter (HOSPITAL_BASED_OUTPATIENT_CLINIC_OR_DEPARTMENT_OTHER): Payer: Self-pay | Admitting: Ophthalmology

## 2014-05-08 ENCOUNTER — Ambulatory Visit: Payer: Self-pay | Admitting: Podiatry

## 2014-05-13 ENCOUNTER — Ambulatory Visit: Payer: Self-pay | Admitting: Podiatry

## 2014-05-16 ENCOUNTER — Ambulatory Visit (INDEPENDENT_AMBULATORY_CARE_PROVIDER_SITE_OTHER): Payer: BC Managed Care – PPO | Admitting: Podiatry

## 2014-05-16 ENCOUNTER — Encounter: Payer: Self-pay | Admitting: Podiatry

## 2014-05-16 ENCOUNTER — Ambulatory Visit (INDEPENDENT_AMBULATORY_CARE_PROVIDER_SITE_OTHER): Payer: BC Managed Care – PPO

## 2014-05-16 VITALS — BP 126/75 | HR 87 | Resp 16 | Ht 64.0 in | Wt 121.0 lb

## 2014-05-16 DIAGNOSIS — M2020 Hallux rigidus, unspecified foot: Secondary | ICD-10-CM

## 2014-05-16 DIAGNOSIS — M21619 Bunion of unspecified foot: Secondary | ICD-10-CM

## 2014-05-16 MED ORDER — HYDROCODONE-ACETAMINOPHEN 10-325 MG PO TABS: 1.0000 | ORAL_TABLET | Freq: Three times a day (TID) | ORAL | Status: DC | PRN

## 2014-05-16 NOTE — Progress Notes (Signed)
   Subjective:    Patient ID: Lynn Johns, female    DOB: 11-11-1951, 63 y.o.   MRN: [id number]  HPI Comments: i have bunions on both of my feet. The left one is worse. i have had them for a long time. The left one is starting to bother me. When exercising it bothers me. Closed in shoes will bother me as well. i dont do anything for my feet. i did use a cushion on my toe.  Foot Pain      Review of Systems  Neurological: Positive for light-headedness.  All other systems reviewed and are negative.      Objective:   Physical Exam        Assessment & Plan:

## 2014-05-16 NOTE — Patient Instructions (Signed)
Pre-Operative Instructions  Congratulations, you have decided to take an important step to improving your quality of life.  You can be assured that the doctors of [facility] will be with you every step of the way.  1. Plan to be at the surgery center/hospital at least 1 (one) hour prior to your scheduled time unless otherwise directed by the surgical center/hospital staff.  You must have a responsible adult accompany you, remain during the surgery and drive you home.  Make sure you have directions to the surgical center/hospital and know how to get there on time. 2. For hospital based surgery you will need to obtain a history and physical form from your family physician within 1 month prior to the date of surgery- we will give you a form for you primary physician.  3. We make every effort to accommodate the date you request for surgery.  There are however, times where surgery dates or times have to be moved.  We will contact you as soon as possible if a change in schedule is required.   4. No Aspirin/Ibuprofen for one week before surgery.  If you are on aspirin, any non-steroidal anti-inflammatory medications (Mobic, Aleve, Ibuprofen) you should stop taking it 7 days prior to your surgery.  You make take Tylenol  For pain prior to surgery.  5. Medications- If you are taking daily heart and blood pressure medications, seizure, reflux, allergy, asthma, anxiety, pain or diabetes medications, make sure the surgery center/hospital is aware before the day of surgery so they may notify you which medications to take or avoid the day of surgery. 6. No food or drink after midnight the night before surgery unless directed otherwise by surgical center/hospital staff. 7. No alcoholic beverages 24 hours prior to surgery.  No smoking 24 hours prior to or 24 hours after surgery. 8. Wear loose pants or shorts- loose enough to fit over bandages, boots, and casts. 9. No slip on shoes, sneakers are best. 10. Bring  your boot with you to the surgery center/hospital.  Also bring crutches or a walker if your physician has prescribed it for you.  If you do not have this equipment, it will be provided for you after surgery. 11. If you have not been contracted by the surgery center/hospital by the day before your surgery, call to confirm the date and time of your surgery. 12. Leave-time from work may vary depending on the type of surgery you have.  Appropriate arrangements should be made prior to surgery with your employer. 13. Prescriptions will be provided immediately following surgery by your doctor.  Have these filled as soon as possible after surgery and take the medication as directed. 14. Remove nail polish on the operative foot. 15. Wash the night before surgery.  The night before surgery wash the foot and leg well with the antibacterial soap provided and water paying special attention to beneath the toenails and in between the toes.  Rinse thoroughly with water and dry well with a towel.  Perform this wash unless told not to do so by your physician.  Enclosed: 1 Ice pack (please put in freezer the night before surgery)   1 Hibiclens skin cleaner   Pre-op Instructions  If you have any questions regarding the instructions, do not hesitate to call our office.  [place]: [address] [place], [place] [address] [phone]  [place]: [address] [place], [place] [address] [phone]  [place]: [address]  [place], [place] [address] [phone]  Dr. Richard   Tuchman DPM, Dr. Norman Regal DPM Dr. Richard Sikora DPM, Dr. M. Todd Hyatt DPM, Dr. Kathryn Egerton DPM 

## 2014-05-16 NOTE — Progress Notes (Signed)
Subjective:     Patient ID: Lynn Johns, female   DOB: 09/23/1951, 63 y.o.   MRN: [id number]  Foot Pain   patient presents stating this left foot is really bothering me and making it increasingly hard to wear shoe gear. The right is not as bad but also gives me trouble   Review of Systems  All other systems reviewed and are negative.      Objective:   Physical Exam  Nursing note and vitals reviewed. Constitutional: She is oriented to person, place, and time.  Cardiovascular: Intact distal pulses.   Musculoskeletal: Normal range of motion.  Neurological: She is oriented to person, place, and time.  Skin: Skin is warm.   neurovascular status intact with muscle strength adequate and range of motion subtalar midtarsal joint within normal limits area patient is found to have hyperostosis medial aspect first metatarsal head left over right and deviation of the hallux in both the transverse and frontal plane left over right. Patient's digital perfusion is good hair growth is normal and partite is moderately depressed upon weightbearing    Assessment:     Significant structural malalignment left over right foot both at the first metatarsal and hallux itself    Plan:     H&P and x-rays reviewed. Discussed correction of left foot versus orthotics or both and patient is opted have orthotics made by her husband and for me to fix her foot. I recommended Austin osteotomy along with probable biplanar Akin osteotomy. Explained surgery and allowed her to read consent form [place] ample opportunities for questions. Explained that total recovery. We'll take 6 months to one year with no long-term guarantees and at this time she signs consent form after review. Dispense air fracture walker with instructions for patient

## 2014-05-20 ENCOUNTER — Telehealth: Payer: Self-pay | Admitting: *Deleted

## 2014-05-20 NOTE — Telephone Encounter (Signed)
I need to talk to nurse about Bunion surgery I'm going to have on 06/04/2014 with Dr. Paulla Dolly.  I need to write down some protocols.  Do I need to make an appointment the week after the surgery?  Do I need to wear the boot to bed the first week after the surgery?  I told her yes, will have to wear the boot to bed the first week after the procedure.  She asked how long afterwards.  I told her it depends on how you are progressing.  Dr. Paulla Dolly will determine that at the followup visit.  She asked about her first post-op appointment.  I told her we have her scheduled for 06/10/2014 at 9:30am.  She stated that is great.  She asked if her surgery could be the first case that morning because her husband is a hand therapist and wants to be able to take care of his patients.  I told her she would have to contact the surgical center in regards to that.  She also stated she needs a medication error changed with her Losartan.  She said she only takes 25mg  but it says 100mg  and she would like that changed.  I told her I would see if I could take care of that for her.

## 2014-05-20 NOTE — Addendum Note (Signed)
Addended by: Lolita Rieger on: 05/20/2014 11:59 AM   Modules accepted: Orders

## 2014-06-04 DIAGNOSIS — M2030 Hallux varus (acquired), unspecified foot: Secondary | ICD-10-CM

## 2014-06-04 DIAGNOSIS — M2010 Hallux valgus (acquired), unspecified foot: Secondary | ICD-10-CM

## 2014-06-04 HISTORY — PX: BUNIONECTOMY: SHX129

## 2014-06-05 ENCOUNTER — Telehealth: Payer: Self-pay

## 2014-06-05 NOTE — Telephone Encounter (Signed)
Spoke with pt regarding post op status. She stated that her pain is very minimal and she was icing and elevating her foot. Advised her to stay in boot and keep sterile dressing dry until her next appt.

## 2014-06-10 ENCOUNTER — Encounter: Payer: Self-pay | Admitting: Podiatry

## 2014-06-10 ENCOUNTER — Ambulatory Visit (INDEPENDENT_AMBULATORY_CARE_PROVIDER_SITE_OTHER): Payer: BC Managed Care – PPO

## 2014-06-10 ENCOUNTER — Ambulatory Visit (INDEPENDENT_AMBULATORY_CARE_PROVIDER_SITE_OTHER): Payer: BC Managed Care – PPO | Admitting: Podiatry

## 2014-06-10 VITALS — BP 124/84 | HR 82 | Resp 16

## 2014-06-10 DIAGNOSIS — M2010 Hallux valgus (acquired), unspecified foot: Secondary | ICD-10-CM

## 2014-06-10 DIAGNOSIS — M2012 Hallux valgus (acquired), left foot: Secondary | ICD-10-CM

## 2014-06-10 DIAGNOSIS — R609 Edema, unspecified: Secondary | ICD-10-CM

## 2014-06-10 NOTE — Progress Notes (Signed)
Subjective:     Patient ID: Lynn Johns, female   DOB: Mar 15, 1951, 63 y.o.   MRN: [id number]  HPI patient presents stating I'm doing very well with my left foot and able to walk without pain or swelling   Review of Systems     Objective:   Physical Exam Neurovascular status intact negative Homans sign was noted and hallux is found to be in a rectus position with range of motion of the first MPJ 25 of dorsiflexion 20 of plantarflexion    Assessment:     Doing well post bunionectomy and Akin osteotomy left    Plan:     X-rays reviewed and discussed continued immobilization for the next 4 weeks and reapplied sterile dressing with instructions for elevation and ibuprofen usage area reappoint 4 weeks and let us any issues should occur were she will point earlier

## 2014-06-11 ENCOUNTER — Encounter: Payer: Self-pay | Admitting: Podiatry

## 2014-06-11 NOTE — Progress Notes (Signed)
Dr Paulla Dolly performed a left Austin bunionectomy and left Aiken osteotomy on 06/04/14. Prescribed Demerol 50mg  #35 1-2 tab Q4-6 hrs prn pain. Phenergan 25mg  #35 1-2 tabs Q4-6 hrs prn nausea

## 2014-06-17 ENCOUNTER — Ambulatory Visit (INDEPENDENT_AMBULATORY_CARE_PROVIDER_SITE_OTHER): Payer: BC Managed Care – PPO | Admitting: Podiatry

## 2014-06-17 ENCOUNTER — Encounter: Payer: Self-pay | Admitting: Podiatry

## 2014-06-17 VITALS — BP 125/79 | HR 80 | Temp 97.6°F | Resp 12

## 2014-06-17 DIAGNOSIS — M2012 Hallux valgus (acquired), left foot: Secondary | ICD-10-CM

## 2014-06-17 DIAGNOSIS — M2010 Hallux valgus (acquired), unspecified foot: Secondary | ICD-10-CM

## 2014-06-17 NOTE — Progress Notes (Signed)
Patient ID: Darienne Belleau, female   DOB: 23-Dec-1950, 63 y.o.   MRN: [id number]  Subjective: Patient presents postop inactivity left foot on 06/04/2014. For approximately one week the distal incisional site has had some watery drainage with out any complaint of pain in the area. She says the area of dehiscence has not increased in the past week. Patient is applying Band-Aid to the area. She has not reduced her standing walking significantly in the past week  Objective: Orientated x3 white female Right calf demonstrates no calf edema The right foot demonstrates an incision in the first MPJ area with superficial dehiscence in the last 10 mm. The dehisced area has good clean granular tissue without any active drainage, warmth, malodor. The remaining proximal incision is well approximated without any erythema, edema or warmth noted  Assessment: Dehiscence distal incisional site right hallux  Plan: Patient advised to do Betadine soaks once daily and apply a gauze dressing and secure with Ace wrap Advise her to limit standing and walking Patient will be allowed to get wound site shower wet in a seated position only and then applied Betadine and gauze dressing  Reappoint x7 days or sooner if patient has concern

## 2014-06-17 NOTE — Patient Instructions (Signed)
Limits standing and walking Soak once daily for about 3-5 minutes and diluted Betadine Cover with gauze and attach with Ace wrap If incision separates more or pain or redness increase notify office  Betadine Soak Instructions  Purchase an 8 oz. bottle of BETADINE solution (Povidone)  THE DAY AFTER THE PROCEDURE  Place 1 tablespoon of betadine solution in a quart of warm tap water.  Submerge your foot or feet with outer bandage intact for the initial soak; this will allow the bandage to become moist and wet for easy lift off.  Once you remove your bandage, continue to soak in the solution for 20 minutes.  This soak should be done twice a day.  Next, remove your foot or feet from solution, blot dry the affected area and cover.  You may use a band aid large enough to cover the area or use gauze and tape.  Apply other medications to the area as directed by the doctor such as cortisporin otic solution (ear drops) or neosporin.  IF YOUR SKIN BECOMES IRRITATED WHILE USING THESE INSTRUCTIONS, IT IS OKAY TO SWITCH TO EPSOM SALTS AND WATER OR WHITE VINEGAR AND WATER.

## 2014-06-24 ENCOUNTER — Encounter: Payer: BC Managed Care – PPO | Admitting: Podiatry

## 2014-07-04 ENCOUNTER — Encounter: Payer: Self-pay | Admitting: Podiatry

## 2014-07-04 ENCOUNTER — Ambulatory Visit (INDEPENDENT_AMBULATORY_CARE_PROVIDER_SITE_OTHER): Payer: BC Managed Care – PPO

## 2014-07-04 ENCOUNTER — Ambulatory Visit (INDEPENDENT_AMBULATORY_CARE_PROVIDER_SITE_OTHER): Payer: BC Managed Care – PPO | Admitting: Podiatry

## 2014-07-04 VITALS — BP 130/83 | HR 83 | Resp 15

## 2014-07-04 DIAGNOSIS — M21619 Bunion of unspecified foot: Secondary | ICD-10-CM

## 2014-07-04 DIAGNOSIS — M21612 Bunion of left foot: Secondary | ICD-10-CM

## 2014-07-04 DIAGNOSIS — M205X9 Other deformities of toe(s) (acquired), unspecified foot: Secondary | ICD-10-CM

## 2014-07-05 NOTE — Progress Notes (Signed)
Subjective:     Patient ID: Lynn Johns, female   DOB: [birth year], 63 y.o.   MRN: [id number]  HPI patient states that my is doing really well and I'm very happy with the result. Able to walk distances without pain or swelling   Review of Systems     Objective:   Physical Exam Neurovascular status intact with well structured first metatarsal left and left hallux with incision site is healing well with a small amount of dehiscence that has resolved    Assessment:     Doing well postoperatively Noreene Larsson osteotomy left    Plan:     Rex-rayed and discussed continued range of motion exercises and also discussed continuation of immobilization with gradual mobilization starting in 1 week area patient will be seen back to recheck

## 2014-07-08 ENCOUNTER — Encounter: Payer: BC Managed Care – PPO | Admitting: Podiatry

## 2014-07-11 ENCOUNTER — Telehealth: Payer: Self-pay | Admitting: *Deleted

## 2014-07-11 DIAGNOSIS — M205X9 Other deformities of toe(s) (acquired), unspecified foot: Secondary | ICD-10-CM

## 2014-07-11 NOTE — Telephone Encounter (Signed)
I returned her call.  She said she was on the way over here to pick up another compression sock.  I asked her if her incision had healed, because earlier the incision had dehisced.  She stated yes it been completely healed for 2 weeks now.  I told her it's okay to get into the pool but keep an eye on it to make sure it doesn't open back up.  She stated oh don't worry, my husband wouldn't let me get close to the water if that was the case.  She said I'll pay close attention to it.  She asked if it was okay to get another compression sock.  I told her that's fine, I'll have it at checkout for you.

## 2014-07-11 NOTE — Telephone Encounter (Signed)
I'd like to request a second compression sock so I can wash and interchange them.  Secondly, I'd like to know if Dr. Paulla Dolly is okay with me getting in the swimming pool.  I can go down the steps into the pool, I can sit on the steps, or I can wear compression sock into the pool.  If you could check on those 2 things I'd appreciate it.

## 2014-08-15 ENCOUNTER — Ambulatory Visit (INDEPENDENT_AMBULATORY_CARE_PROVIDER_SITE_OTHER): Payer: BC Managed Care – PPO | Admitting: Podiatry

## 2014-08-15 ENCOUNTER — Encounter: Payer: Self-pay | Admitting: Podiatry

## 2014-08-15 ENCOUNTER — Ambulatory Visit (INDEPENDENT_AMBULATORY_CARE_PROVIDER_SITE_OTHER): Payer: BC Managed Care – PPO

## 2014-08-15 VITALS — BP 104/71 | HR 103 | Resp 16

## 2014-08-15 DIAGNOSIS — M2010 Hallux valgus (acquired), unspecified foot: Secondary | ICD-10-CM

## 2014-08-15 DIAGNOSIS — M2012 Hallux valgus (acquired), left foot: Secondary | ICD-10-CM

## 2014-08-15 NOTE — Progress Notes (Signed)
Subjective:     Patient ID: Lynn Johns, female   DOB: 03/05/1951, 63 y.o.   MRN: [id number]  HPI patient presents stating my left foot is doing great and I want to get my right foot fixed in December. States that she's able to run now and that she's having minimal discomfort   Review of Systems     Objective:   Physical Exam Neurovascular status intact with well-healed surgical site left first metatarsal left hallux with hallux that in rectus position and range of motion with 40 of dorsiflexion 30 plantar flexion with no pain or crepitus noted. Right structural bunion of a moderate nature with mild deviation of the hallux against the second toe    Assessment:     Healing very well from foot surgery left with moderate structural HAV deformity right and deviation of the right big toe    Plan:     H&P performed and conditions discussed. I do think that this would be best corrected and I reviewed Austin bunionectomy for the right with possible Akin osteotomy right. Very good chance we will not have to do the Akin on this one and I did explain that to her and I reviewed all possible complications and allowed her to sign the consent form after review. Left foot doing very well and patient's discharge from the surgery

## 2014-08-22 ENCOUNTER — Telehealth: Payer: Self-pay | Admitting: *Deleted

## 2014-08-22 NOTE — Telephone Encounter (Signed)
Dr. Paulla Dolly had made an appointment for me to have bunion surgery on December 15th.  I want to get information about when the follow-up appointment will be.  If you could give me that information when you call, I'd really appreciate it.  Thank you, bye.

## 2014-08-26 NOTE — Telephone Encounter (Signed)
My surgery is scheduled on 11/26/2014.  Calling to find out when my follow up appointment will be.  Give me a call back I'd appreciate it, thank you.  I called and informed her 12/02/2014 at 9am.  She stated, "Okay, thank you, I appreciate you calling me.

## 2014-10-30 ENCOUNTER — Telehealth: Payer: Self-pay | Admitting: Internal Medicine

## 2014-11-01 NOTE — Telephone Encounter (Signed)
Pt informed records approved.  Appt set up with Dr. Olevia Perches

## 2014-11-04 ENCOUNTER — Encounter: Payer: Self-pay | Admitting: Internal Medicine

## 2014-11-05 ENCOUNTER — Encounter: Payer: Self-pay | Admitting: *Deleted

## 2014-11-26 ENCOUNTER — Encounter: Payer: Self-pay | Admitting: Podiatry

## 2014-11-26 DIAGNOSIS — M2011 Hallux valgus (acquired), right foot: Secondary | ICD-10-CM

## 2014-11-26 DIAGNOSIS — M2031 Hallux varus (acquired), right foot: Secondary | ICD-10-CM

## 2014-11-27 ENCOUNTER — Telehealth: Payer: Self-pay | Admitting: Podiatry

## 2014-11-27 NOTE — Telephone Encounter (Signed)
Patient called the call service stating that she looked underneath the bandage to "make sure everything was all right" and she noticed a "dime size" area of blood on the bandage. The blood is not through the outer dressing. I discussed with the patient that she can except a small amount of bleeding after surgery. Continue to monitor the area. If symptoms worsen to call. Also, discussed with the patient to leave the surgical dressing clean, dry, and intact. Continue to ice/elvate. No other complaints at this time.

## 2014-12-02 ENCOUNTER — Encounter: Payer: Self-pay | Admitting: Podiatry

## 2014-12-02 ENCOUNTER — Ambulatory Visit (INDEPENDENT_AMBULATORY_CARE_PROVIDER_SITE_OTHER): Payer: BC Managed Care – PPO | Admitting: Podiatry

## 2014-12-02 ENCOUNTER — Ambulatory Visit (INDEPENDENT_AMBULATORY_CARE_PROVIDER_SITE_OTHER): Payer: BC Managed Care – PPO

## 2014-12-02 VITALS — BP 108/66 | HR 64 | Temp 97.5°F | Resp 17

## 2014-12-02 DIAGNOSIS — Z9889 Other specified postprocedural states: Secondary | ICD-10-CM

## 2014-12-02 NOTE — Progress Notes (Signed)
Subjective:     Patient ID: Lynn Johns, female   DOB: 1951/07/18, 63 y.o.   MRN: [id number]  HPI patient states I'm doing well with my right foot and having minimal discomfort or swelling   Review of Systems     Objective:   Physical Exam Neurovascular status intact negative Homans sign noted with well-healing surgical site right first metatarsal and right hallux with wound edges coapted well and good alignment of the big toe with good motion of the first MPJ with approximate 30 dorsiflexion 20 plantarflexion    Assessment:     Healing well post osteotomy first metatarsal and right hallux    Plan:     X-rays reviewed with patient and sterile dressing reapplied. Gave instructions on continued immobilization elevation compression and patient will be seen back in 3 weeks earlier if any issues should occur

## 2014-12-02 NOTE — Progress Notes (Signed)
   Subjective:    Patient ID: Lynn Johns, female    DOB: [birth year], 63 y.o.   MRN: [id number]  HPI  Pt is post op surgery right foot DOS 12.15.15, she states that her foot is doing well, not too much pain.  Review of Systems     Objective:   Physical Exam        Assessment & Plan:

## 2014-12-03 NOTE — Progress Notes (Signed)
Dr Paulla Dolly performed a right Austin bunion repair and right Aiken osteotomy on 11/26/14

## 2014-12-09 ENCOUNTER — Other Ambulatory Visit: Payer: Self-pay

## 2014-12-09 DIAGNOSIS — Z1231 Encounter for screening mammogram for malignant neoplasm of breast: Secondary | ICD-10-CM

## 2014-12-23 ENCOUNTER — Ambulatory Visit (INDEPENDENT_AMBULATORY_CARE_PROVIDER_SITE_OTHER): Payer: BLUE CROSS/BLUE SHIELD | Admitting: Podiatry

## 2014-12-23 ENCOUNTER — Ambulatory Visit (INDEPENDENT_AMBULATORY_CARE_PROVIDER_SITE_OTHER): Payer: BLUE CROSS/BLUE SHIELD

## 2014-12-23 VITALS — BP 126/65 | HR 74 | Resp 16

## 2014-12-23 DIAGNOSIS — Z9889 Other specified postprocedural states: Secondary | ICD-10-CM

## 2014-12-23 DIAGNOSIS — M779 Enthesopathy, unspecified: Secondary | ICD-10-CM

## 2014-12-23 MED ORDER — TRIAMCINOLONE ACETONIDE 10 MG/ML IJ SUSP
10.0000 mg | Freq: Once | INTRAMUSCULAR | Status: AC
  Administered 2014-12-23: 10 mg

## 2014-12-23 NOTE — Progress Notes (Signed)
Subjective:     Patient ID: Lynn Johns, female   DOB: 06-25-1951, 64 y.o.   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 Reappoint in 6 weeks and may gradually return to soft shoe gear

## 2014-12-31 ENCOUNTER — Encounter: Payer: Self-pay | Admitting: Internal Medicine

## 2014-12-31 ENCOUNTER — Ambulatory Visit (INDEPENDENT_AMBULATORY_CARE_PROVIDER_SITE_OTHER): Payer: BLUE CROSS/BLUE SHIELD | Admitting: Internal Medicine

## 2014-12-31 VITALS — BP 105/70 | HR 96 | Ht 65.0 in | Wt 138.2 lb

## 2014-12-31 DIAGNOSIS — Z86010 Personal history of colonic polyps: Secondary | ICD-10-CM

## 2014-12-31 DIAGNOSIS — Z1212 Encounter for screening for malignant neoplasm of rectum: Secondary | ICD-10-CM

## 2014-12-31 DIAGNOSIS — Z1211 Encounter for screening for malignant neoplasm of colon: Secondary | ICD-10-CM

## 2014-12-31 NOTE — Progress Notes (Signed)
Lynn Johns Feb 23, 1951 [id number]  Note: This dictation was prepared with Dragon digital system. Any transcriptional errors that result from this procedure are unintentional.   History of Present Illness: This is a 64 year old white female here to discuss screening colonoscopy. She had a colon exam in February 2008 were 3 polyps were removed. One tubular adenoma and 2 hyperplastic polyps. Fentanyl and Versed were used as sedatives. Patient had an unpleasant experience following procedure  . Her husband had hard time taking care of her after the procedure..She is now due for recall colonoscopy but is reluctant to use Versed and fentanyl . She has no  family history of colon cancer. Her bowel habits are regular. Does not take any long-term pain medications or anticoagulants. She has recently had eye surgery were propofol was used and tolerated it very well    Past Medical History  Diagnosis Date  . Cystocele   . Hypertension   . Arthritis     lumbar radiculopathy   . PONV (postoperative nausea and vomiting)     needs motion sickness patch prior to surgery,pt. remarks that she had hypotension for 2 days after surgery, she thinks related to medicine from anesth., also reports allergy to VERSED & Fentanyl, damage to estachian tubes from prev. ET tube during lumbar fusion , myringotomy tubes in place since 06/2012  . Tubular adenoma of colon   . Hemorrhoids   . Diverticulosis   . Kidney disease     Past Surgical History  Procedure Laterality Date  . Tubal ligation  1986  . Abdominal hysterectomy  July 2006    with bilateral salpingo-oophorectomy  . Rotator cuff surgery Right 06/2010, 07/2010  . Anterior lumbar decompression and arthrodesis      L5-S1  . Cervical disc removal  11-2011    C5 and C6  . Anterior lumbar fusion  05/19/2012    Procedure: ANTERIOR LUMBAR FUSION 1 LEVEL;  Surgeon: Kristeen Miss, MD;  Location: [facility];  Service: Neurosurgery;  Laterality: N/A;  Lumbar  three-fourAnterior lumbar interbody fusion  . Myringotomy  06/2012    another set since then   . Colonoscopy    . Strabismus surgery Right 03/01/2014    Procedure: REPAIR STRABISMUS RIGHT EYE ;  Surgeon: Derry Skill, MD;  Location: [facility];  Service: Ophthalmology;  Laterality: Right;  . Bunionectomy  06/04/14  . Aiken osteotomy    . Rotator cuff repair Left 12/2010, 03/2011  . Replacement disc anterior lumbar spine    . Microdiscectomy lumbar  2010    Allergies  Allergen Reactions  . Fentanyl     Rash, insomnia, uncontrollable crying  . Valium [Diazepam] Other (See Comments)    Makes her cry and be happy at the same time  . Versed [Midazolam]     Rash, insomnia, memory loss    Family history and social history have been reviewed.  Review of Systems: Denies abdominal pain, constipation or rectal bleeding  The remainder of the 10 point ROS is negative except as outlined in the H&P  Physical Exam: General Appearance Well developed, in no distress Eyes  Non icteric  HEENT  Non traumatic, normocephalic  Mouth No lesion, tongue papillated, no cheilosis Neck Supple without adenopathy, thyroid not enlarged, no carotid bruits, no JVD Lungs Clear to auscultation bilaterally COR Normal S1, normal S2, regular rhythm, no murmur, quiet precordium Abdomen soft nontender abdomen with normoactive bowel sounds. No distention. Liver edge at costal margin Rectal not done Extremities  No pedal edema Skin No lesions Neurological Alert and oriented x 3 Psychological Normal mood and affect  Assessment and Plan:   64 year old white female who is a due for recall colonoscopy. She had unpleasant experience following last colonoscopy 8 years ago. She is willing to reschedule now and we have discussed the moderate sedation using propofol. we also discussed prep. She would prefer to take tablets but I'm not sure  Visicol is available and I told her that there is a high risk of renal  impairment using these. She wants to schedule for either April or June and we may have to send her at recall letter.  . We have also discussed her diagnosis of diverticulosis. She does not have any specific symptoms pertaining to diverticulosis    Delfin Edis @TODAY (<PARAMETER> error)@

## 2014-12-31 NOTE — Patient Instructions (Addendum)
Please call back to schedule your colonoscopy Dr Maurice Small

## 2015-01-08 ENCOUNTER — Ambulatory Visit: Payer: BC Managed Care – PPO

## 2015-02-03 ENCOUNTER — Other Ambulatory Visit: Payer: Self-pay | Admitting: Podiatry

## 2015-02-03 ENCOUNTER — Ambulatory Visit (INDEPENDENT_AMBULATORY_CARE_PROVIDER_SITE_OTHER): Payer: BLUE CROSS/BLUE SHIELD | Admitting: Podiatry

## 2015-02-03 ENCOUNTER — Ambulatory Visit (INDEPENDENT_AMBULATORY_CARE_PROVIDER_SITE_OTHER): Payer: BLUE CROSS/BLUE SHIELD

## 2015-02-03 ENCOUNTER — Encounter: Payer: Self-pay | Admitting: Podiatry

## 2015-02-03 VITALS — BP 140/83 | HR 85 | Resp 16

## 2015-02-03 DIAGNOSIS — M21612 Bunion of left foot: Secondary | ICD-10-CM

## 2015-02-03 DIAGNOSIS — M2011 Hallux valgus (acquired), right foot: Secondary | ICD-10-CM

## 2015-02-03 DIAGNOSIS — M2012 Hallux valgus (acquired), left foot: Secondary | ICD-10-CM

## 2015-02-03 DIAGNOSIS — M779 Enthesopathy, unspecified: Secondary | ICD-10-CM

## 2015-02-03 MED ORDER — TRIAMCINOLONE ACETONIDE 10 MG/ML IJ SUSP
10.0000 mg | Freq: Once | INTRAMUSCULAR | Status: AC
  Administered 2015-02-03: 10 mg

## 2015-02-04 NOTE — Progress Notes (Signed)
Subjective:     Patient ID: Lynn Johns, female   DOB: 1951/06/26, 64 y.o.   MRN: [id number]  HPI patient states I'm doing well with my right foot but still having a little pain on the outside of the base of the metatarsal. The range of motion is good and I'm satisfied right now with where I'm at as far as healing goes   Review of Systems     Objective:   Physical Exam Neurovascular status intact with muscle strength adequate and range of motion within normal limits. The incision site on the right first metatarsal right hallux is doing well with good range of motion of 30 dorsiflexion 20 plantar flexion but there continues to be persistent discomfort base of the fifth metatarsal right foot at the insertion of peroneal tendon    Assessment:     Doing well post surgery right foot with good alignment noted and range of motion and mild tendinitis which was probably compensatory in nature right    Plan:     Careful injection right 3 mg [place] some Kenalog and advised him reduced activity and discuss gradual increase in activities over the next 6 weeks and she'll be checked back for final visit at that time or earlier if any issues should occur

## 2015-02-06 ENCOUNTER — Ambulatory Visit
Admission: RE | Admit: 2015-02-06 | Discharge: 2015-02-06 | Disposition: A | Discharge status: Home / Self Care | Payer: BLUE CROSS/BLUE SHIELD | Source: Ambulatory Visit

## 2015-02-06 DIAGNOSIS — Z1231 Encounter for screening mammogram for malignant neoplasm of breast: Secondary | ICD-10-CM

## 2015-03-10 ENCOUNTER — Ambulatory Visit (INDEPENDENT_AMBULATORY_CARE_PROVIDER_SITE_OTHER): Payer: BLUE CROSS/BLUE SHIELD

## 2015-03-10 ENCOUNTER — Ambulatory Visit (INDEPENDENT_AMBULATORY_CARE_PROVIDER_SITE_OTHER): Payer: BLUE CROSS/BLUE SHIELD | Admitting: Podiatry

## 2015-03-10 DIAGNOSIS — M2042 Other hammer toe(s) (acquired), left foot: Secondary | ICD-10-CM

## 2015-03-10 DIAGNOSIS — R252 Cramp and spasm: Secondary | ICD-10-CM

## 2015-03-10 MED ORDER — DIAZEPAM 5 MG PO TABS: 5.0000 mg | ORAL_TABLET | Freq: Two times a day (BID) | ORAL | Status: DC | PRN

## 2015-03-11 NOTE — Progress Notes (Signed)
Subjective:     Patient ID: Lynn Johns, female   DOB: 1951-09-07, 64 y.o.   MRN: [id number]  HPI patient points left states I been getting some cramping in my foot on the left foot into the arch and into my lower leg   Review of Systems     Objective:   Physical Exam Neurovascular status intact muscle strength was adequate with moderate discomfort in the arch region but no indications of organic problem    Assessment:     Difficult to determine the cause of the cramping that she is experiencing with no organic cause noted    Plan:     We will start her on several ounces of tonic water at night and low dose Valium to try to reduce spasm and will be seen back to recheck

## 2015-03-26 ENCOUNTER — Encounter: Payer: Self-pay | Admitting: Internal Medicine

## 2015-05-21 ENCOUNTER — Telehealth: Payer: Self-pay | Admitting: *Deleted

## 2015-05-21 ENCOUNTER — Encounter: Payer: Self-pay | Admitting: *Deleted

## 2015-05-21 NOTE — Progress Notes (Signed)
Patient ID: Lynn Johns, female   DOB: 1951/08/19, 64 y.o.   MRN: [id number] Pt arrived for PV today 6/8.  Pt had left shoulder surgery Monday 6/6.  Follow up with orthopedic doctor is not until after scheduled colonoscopy.  Explained to pt that she will need to lay on her left side for colonoscopy and she will need okay from orthopedic doctor to do this.  She will call and reschedule after she has seen orthopedic doctor on 6/23 and has okay to lay on left side.  PV and colonoscopy for 6/22 cancelled.

## 2015-05-21 NOTE — Telephone Encounter (Signed)
-----   Message from Lafayette Dragon, MD sent at 05/21/2015  1:14 PM EDT ----- Regarding: RE: rescheduled colon I think she needs to heal her shoulder completely  Before having colonoscopy. ----- Message -----    From: Lowry Ram, RN    Sent: 05/21/2015  11:19 AM      To: Lafayette Dragon, MD Subject: rescheduled colon                              Dr Olevia Perches:  I saw this pt today in [place].  She had left shoulder surgery this Monday 6/6.  Colonoscopy is scheduled for 6/22.  She does not see orthopedic doctor for follow up until after scheduled colonoscopy.  I told pt that she needed an okay from ortho MD to lay on left side since it will only be 2 weeks after surgery that her colonoscopy is scheduled.  I have cancelled colonoscopy and she will call back to schedule colon when she has okay from MD.

## 2015-05-21 NOTE — Telephone Encounter (Signed)
Talked with pt; informed her of Dr. Nichola Sizer recommendation to have colonoscopy after she has been released by orthopedic doctor.  Pt verbalizes understanding

## 2015-06-04 ENCOUNTER — Encounter: Payer: BLUE CROSS/BLUE SHIELD | Admitting: Internal Medicine

## 2015-06-13 ENCOUNTER — Encounter: Payer: BLUE CROSS/BLUE SHIELD | Admitting: Internal Medicine

## 2015-09-11 ENCOUNTER — Ambulatory Visit (INDEPENDENT_AMBULATORY_CARE_PROVIDER_SITE_OTHER): Payer: BLUE CROSS/BLUE SHIELD | Admitting: Podiatry

## 2015-09-11 ENCOUNTER — Encounter: Payer: Self-pay | Admitting: Podiatry

## 2015-09-11 DIAGNOSIS — L600 Ingrowing nail: Secondary | ICD-10-CM

## 2015-09-11 DIAGNOSIS — B351 Tinea unguium: Secondary | ICD-10-CM

## 2015-09-11 NOTE — Progress Notes (Signed)
Subjective:     Patient ID: Lynn Johns, female   DOB: [birth year], 64 y.o.   MRN: [id number]  HPI patient presents with a split third nail left and states that she had had this on another nail that we took off and it grew back normally and she would like the same procedure done   Review of Systems     Objective:   Physical Exam Neurovascular status intact muscle strength adequate with good digital perfusion noted. Patient's noted to have a damage third nail left with a split in the middle the nailbed with no history of trauma    Assessment:     Damage third nail left with split in the distal two thirds of the bed middle of the nail    Plan:     Reviewed condition and at this point where given try nail removal and allowing it to regrow. I did explain there is no guarantee of the success of this and possible complications and patient wants procedure. Today I went ahead and infiltrated 60 mg Xylocaine Marcaine mixture remove the third nail flushed out the bed and applied sterile dressing. Gave instructions on soaks and reappoint

## 2015-09-11 NOTE — Patient Instructions (Addendum)

## 2015-09-12 ENCOUNTER — Telehealth: Payer: Self-pay | Admitting: *Deleted

## 2015-09-12 NOTE — Telephone Encounter (Signed)
Called patient at [phone] (Cell #) to check to see how the patient was feeling from their ingrown toenail procedure that was performed on Thursday, September 11, 2015. Pt stated, "toe is still sore, but doing okay". Pt is going to soak their toe later tonight.

## 2015-11-05 ENCOUNTER — Other Ambulatory Visit: Payer: Self-pay | Admitting: Orthopedic Surgery

## 2015-11-05 DIAGNOSIS — M25512 Pain in left shoulder: Secondary | ICD-10-CM

## 2015-11-13 ENCOUNTER — Other Ambulatory Visit: Payer: Self-pay | Admitting: Nephrology

## 2015-11-13 DIAGNOSIS — N183 Chronic kidney disease, stage 3 unspecified: Secondary | ICD-10-CM

## 2015-11-18 ENCOUNTER — Ambulatory Visit
Admission: RE | Admit: 2015-11-18 | Discharge: 2015-11-18 | Disposition: A | Discharge status: Home / Self Care | Payer: BLUE CROSS/BLUE SHIELD | Source: Ambulatory Visit | Attending: Nephrology | Admitting: Nephrology

## 2015-11-18 DIAGNOSIS — N183 Chronic kidney disease, stage 3 unspecified: Secondary | ICD-10-CM

## 2015-11-24 ENCOUNTER — Ambulatory Visit
Admission: RE | Admit: 2015-11-24 | Discharge: 2015-11-24 | Disposition: A | Discharge status: Home / Self Care | Payer: BLUE CROSS/BLUE SHIELD | Source: Ambulatory Visit | Attending: Orthopedic Surgery | Admitting: Orthopedic Surgery

## 2015-11-24 DIAGNOSIS — M25512 Pain in left shoulder: Secondary | ICD-10-CM

## 2015-11-30 ENCOUNTER — Ambulatory Visit
Admission: RE | Admit: 2015-11-30 | Discharge: 2015-11-30 | Disposition: A | Discharge status: Home / Self Care | Payer: BLUE CROSS/BLUE SHIELD | Source: Ambulatory Visit | Attending: Orthopedic Surgery | Admitting: Orthopedic Surgery

## 2016-01-01 ENCOUNTER — Other Ambulatory Visit: Payer: Self-pay

## 2016-01-01 DIAGNOSIS — Z1231 Encounter for screening mammogram for malignant neoplasm of breast: Secondary | ICD-10-CM

## 2016-02-09 ENCOUNTER — Ambulatory Visit
Admission: RE | Admit: 2016-02-09 | Discharge: 2016-02-09 | Disposition: A | Discharge status: Home / Self Care | Payer: BLUE CROSS/BLUE SHIELD | Source: Ambulatory Visit

## 2016-02-09 DIAGNOSIS — Z1231 Encounter for screening mammogram for malignant neoplasm of breast: Secondary | ICD-10-CM

## 2016-03-20 DIAGNOSIS — M25511 Pain in right shoulder: Secondary | ICD-10-CM | POA: Diagnosis not present

## 2016-03-20 DIAGNOSIS — G8929 Other chronic pain: Secondary | ICD-10-CM | POA: Diagnosis not present

## 2016-04-01 DIAGNOSIS — M25511 Pain in right shoulder: Secondary | ICD-10-CM | POA: Diagnosis not present

## 2016-04-05 DIAGNOSIS — Z6823 Body mass index (BMI) 23.0-23.9, adult: Secondary | ICD-10-CM | POA: Diagnosis not present

## 2016-04-05 DIAGNOSIS — Z01419 Encounter for gynecological examination (general) (routine) without abnormal findings: Secondary | ICD-10-CM | POA: Diagnosis not present

## 2016-04-06 DIAGNOSIS — R5383 Other fatigue: Secondary | ICD-10-CM | POA: Diagnosis not present

## 2016-04-06 DIAGNOSIS — I10 Essential (primary) hypertension: Secondary | ICD-10-CM | POA: Diagnosis not present

## 2016-04-08 DIAGNOSIS — M25512 Pain in left shoulder: Secondary | ICD-10-CM | POA: Diagnosis not present

## 2016-04-26 DIAGNOSIS — M1990 Unspecified osteoarthritis, unspecified site: Secondary | ICD-10-CM | POA: Diagnosis not present

## 2016-04-26 DIAGNOSIS — N183 Chronic kidney disease, stage 3 (moderate): Secondary | ICD-10-CM | POA: Diagnosis not present

## 2016-04-26 DIAGNOSIS — E876 Hypokalemia: Secondary | ICD-10-CM | POA: Diagnosis not present

## 2016-04-26 DIAGNOSIS — J309 Allergic rhinitis, unspecified: Secondary | ICD-10-CM | POA: Diagnosis not present

## 2016-04-29 DIAGNOSIS — H60399 Other infective otitis externa, unspecified ear: Secondary | ICD-10-CM | POA: Diagnosis not present

## 2016-04-29 DIAGNOSIS — H905 Unspecified sensorineural hearing loss: Secondary | ICD-10-CM | POA: Diagnosis not present

## 2016-04-29 DIAGNOSIS — H7201 Central perforation of tympanic membrane, right ear: Secondary | ICD-10-CM | POA: Diagnosis not present

## 2016-04-29 DIAGNOSIS — H8102 Meniere's disease, left ear: Secondary | ICD-10-CM | POA: Diagnosis not present

## 2016-05-04 NOTE — H&P (Signed)
Lynn Johns is an 65 y.o. female.    Chief Complaint: right shoulder pain   HPI: Pt is a 66 y.o. female complaining of right shoulder pain for multiple years. Pain had continually increased since the beginning. X-rays in the clinic show labral degeneration right shoulder. Pt has tried various conservative treatments which have failed to alleviate their symptoms, including injections and therapy. Various options are discussed with the patient. Risks, benefits and expectations were discussed with the patient. Patient understand the risks, benefits and expectations and wishes to proceed with surgery.   PCP:  Jonathon Bellows, MD  D/C Plans: Home  PMH: Past Medical History  Diagnosis Date  . Cystocele   . Hypertension   . Arthritis     lumbar radiculopathy   . PONV (postoperative nausea and vomiting)     needs motion sickness patch prior to surgery,pt. remarks that she had hypotension for 2 days after surgery, she thinks related to medicine from anesth., also reports allergy to VERSED & Fentanyl, damage to estachian tubes from prev. ET tube during lumbar fusion , myringotomy tubes in place since 06/2012  . Tubular adenoma of colon   . Hemorrhoids   . Diverticulosis   . Kidney disease     PSH: Past Surgical History  Procedure Laterality Date  . Tubal ligation  1986  . Abdominal hysterectomy  July 2006    with bilateral salpingo-oophorectomy  . Rotator cuff surgery Right 06/2010, 07/2010  . Anterior lumbar decompression and arthrodesis      L5-S1  . Cervical disc removal  11-2011    C5 and C6  . Anterior lumbar fusion  05/19/2012    Procedure: ANTERIOR LUMBAR FUSION 1 LEVEL;  Surgeon: Kristeen Miss, MD;  Location: [facility];  Service: Neurosurgery;  Laterality: N/A;  Lumbar three-fourAnterior lumbar interbody fusion  . Myringotomy  06/2012    another set since then   . Colonoscopy    . Strabismus surgery Right 03/01/2014    Procedure: REPAIR STRABISMUS RIGHT EYE ;  Surgeon: Derry Skill, MD;  Location: [facility];  Service: Ophthalmology;  Laterality: Right;  . Bunionectomy  06/04/14  . Aiken osteotomy    . Rotator cuff repair Left 12/2010, 03/2011  . Replacement disc anterior lumbar spine    . Microdiscectomy lumbar  2010    Social History:  reports that she has never smoked. She does not have any smokeless tobacco history on file. She reports that she drinks about 0.6 oz of alcohol per week. She reports that she does not use illicit drugs.  Allergies:  Allergies  Allergen Reactions  . Fentanyl     Rash, insomnia, uncontrollable crying  . Valium [Diazepam] Other (See Comments)    Makes her cry and be happy at the same time  . Versed [Midazolam]     Rash, insomnia, memory loss    Medications: No current facility-administered medications for this encounter.   Current Outpatient Prescriptions  Medication Sig Dispense Refill  . atenolol (TENORMIN) 25 MG tablet Take by mouth daily.    . diazepam (VALIUM) 5 MG tablet Take 1 tablet (5 mg total) by mouth every 12 (twelve) hours as needed for anxiety. 60 tablet 0  . fexofenadine (ALLEGRA) 180 MG tablet Take 180 mg by mouth daily.    . methocarbamol (ROBAXIN) 500 MG tablet Take 500 mg by mouth 4 (four) times daily.    . potassium chloride SA (K-DUR,KLOR-CON) 20 MEQ tablet 20 mEq.  11  .  spironolactone (ALDACTONE) 50 MG tablet Take 50 mg by mouth daily.    Marland Kitchen VAGIFEM 10 MCG TABS vaginal tablet       No results found for this or any previous visit (from the past 48 hour(s)). No results found.  ROS: Pain with rom of the right upper extremity  Physical Exam:  Alert and oriented 65 y.o. female in no acute distress Cranial nerves 2-12 intact Cervical spine: full rom with no tenderness, nv intact distally Chest: active breath sounds bilaterally, no wheeze rhonchi or rales Heart: regular rate and rhythm, no murmur Abd: non tender non distended with active bowel sounds Hip is stable with rom  Guarded  rom of right upper extremity nv intact distally No rashes or edema Strength of ER and IR 4.5/5 bilaterally  Assessment/Plan Assessment: right shoulder pain  Plan: Patient will undergo a right shoulder arthroscopy by Dr. Veverly Fells at [facility]. Risks benefits and expectations were discussed with the patient. Patient understand risks, benefits and expectations and wishes to proceed.

## 2016-05-17 DIAGNOSIS — R5383 Other fatigue: Secondary | ICD-10-CM | POA: Diagnosis not present

## 2016-05-17 DIAGNOSIS — M150 Primary generalized (osteo)arthritis: Secondary | ICD-10-CM | POA: Diagnosis not present

## 2016-05-17 DIAGNOSIS — R252 Cramp and spasm: Secondary | ICD-10-CM | POA: Diagnosis not present

## 2016-05-17 DIAGNOSIS — M2550 Pain in unspecified joint: Secondary | ICD-10-CM | POA: Diagnosis not present

## 2016-05-17 DIAGNOSIS — M79642 Pain in left hand: Secondary | ICD-10-CM | POA: Diagnosis not present

## 2016-05-17 DIAGNOSIS — M79641 Pain in right hand: Secondary | ICD-10-CM | POA: Diagnosis not present

## 2016-05-18 NOTE — Pre-Procedure Instructions (Signed)
Lynn Johns  05/18/2016      [facility] - [place], [place] [address] AT [facility] [place] [facility] [place] [address] [place] [address] Phone: [phone] Fax: [phone]    Your procedure is scheduled on   Friday  05/28/16  Report to [facility] Admitting at 530 A.M.  Call this number if you have problems the morning of surgery:  [phone]   Remember:  Do not eat food or drink liquids after midnight.  Take these medicines the morning of surgery with A SIP OF WATER   ATENOLOL (TENORMIN), POTASSIUM  (STOP ASPIRIN, IBUPROFEN, ADVIL, MOTRIN, GOODY POWDERS/ BC'S, HERBAL MEDICINES)   Do not wear jewelry, make-up or nail polish.  Do not wear lotions, powders, or perfumes.  You may wear deodorant.  Do not shave 48 hours prior to surgery.  Men may shave face and neck.  Do not bring valuables to the hospital.  [facility] is not responsible for any belongings or valuables.  Contacts, dentures or bridgework may not be worn into surgery.  Leave your suitcase in the car.  After surgery it may be brought to your room.  For patients admitted to the hospital, discharge time will be determined by your treatment team.  Patients discharged the day of surgery will not be allowed to drive home.   Name and phone number of your driver:  Special instructions:  [place] - Preparing for Surgery  Before surgery, you can play an important role.  Because skin is not sterile, your skin needs to be as free of germs as possible.  You can reduce the number of germs on you skin by washing with CHG (chlorahexidine gluconate) soap before surgery.  CHG is an antiseptic cleaner which kills germs and bonds with the skin to continue killing germs even after washing.  Please DO NOT use if you have an allergy to CHG or antibacterial soaps.  If your skin becomes reddened/irritated stop using the CHG and inform your nurse when you arrive at Short Stay.  Do not  shave (including legs and underarms) for at least 48 hours prior to the first CHG shower.  You may shave your face.  Please follow these instructions carefully:   1.  Shower with CHG Soap the night before surgery and the                                morning of Surgery.  2.  If you choose to wash your hair, wash your hair first as usual with your       normal shampoo.  3.  After you shampoo, rinse your hair and body thoroughly to remove the                      Shampoo.  4.  Use CHG as you would any other liquid soap.  You can apply chg directly       to the skin and wash gently with scrungie or a clean washcloth.  5.  Apply the CHG Soap to your body ONLY FROM THE NECK DOWN.        Do not use on open wounds or open sores.  Avoid contact with your eyes,       ears, mouth and genitals (private parts).  Wash genitals (private parts)       with your normal soap.  6.  Wash thoroughly, paying special attention to the area where your surgery        will be performed.  7.  Thoroughly rinse your body with warm water from the neck down.  8.  DO NOT shower/wash with your normal soap after using and rinsing off       the CHG Soap.  9.  Pat yourself dry with a clean towel.            10.  Wear clean pajamas.            11.  Place clean sheets on your bed the night of your first shower and do not        sleep with pets.  Day of Surgery  Do not apply any lotions/deoderants the morning of surgery.  Please wear clean clothes to the hospital/surgery center.     Please read over the following fact sheets that you were given. Pain Booklet, Coughing and Deep Breathing and Surgical Site Infection Prevention

## 2016-05-19 ENCOUNTER — Encounter (HOSPITAL_COMMUNITY): Payer: Self-pay

## 2016-05-19 ENCOUNTER — Encounter (HOSPITAL_COMMUNITY)
Admission: RE | Admit: 2016-05-19 | Discharge: 2016-05-19 | Disposition: A | Discharge status: Home / Self Care | Payer: BLUE CROSS/BLUE SHIELD | Source: Ambulatory Visit | Attending: Orthopedic Surgery | Admitting: Orthopedic Surgery

## 2016-05-19 DIAGNOSIS — Z01818 Encounter for other preprocedural examination: Secondary | ICD-10-CM | POA: Insufficient documentation

## 2016-05-19 DIAGNOSIS — M19011 Primary osteoarthritis, right shoulder: Secondary | ICD-10-CM | POA: Diagnosis not present

## 2016-05-19 DIAGNOSIS — M7551 Bursitis of right shoulder: Secondary | ICD-10-CM | POA: Insufficient documentation

## 2016-05-19 DIAGNOSIS — I10 Essential (primary) hypertension: Secondary | ICD-10-CM | POA: Diagnosis not present

## 2016-05-19 DIAGNOSIS — Z01812 Encounter for preprocedural laboratory examination: Secondary | ICD-10-CM | POA: Insufficient documentation

## 2016-05-19 LAB — CBC
HCT: 48.1 % — ABNORMAL HIGH (ref 36.0–46.0)
Hemoglobin: 16.2 g/dL — ABNORMAL HIGH (ref 12.0–15.0)
MCH: 30.3 pg (ref 26.0–34.0)
MCHC: 33.7 g/dL (ref 30.0–36.0)
MCV: 90.1 fL (ref 78.0–100.0)
Platelets: 324 10*3/uL (ref 150–400)
RBC: 5.34 MIL/uL — ABNORMAL HIGH (ref 3.87–5.11)
RDW: 13.2 % (ref 11.5–15.5)
WBC: 9 10*3/uL (ref 4.0–10.5)

## 2016-05-19 LAB — BASIC METABOLIC PANEL
Anion gap: 8 (ref 5–15)
BUN: 17 mg/dL (ref 6–20)
CO2: 26 mmol/L (ref 22–32)
Calcium: 10.1 mg/dL (ref 8.9–10.3)
Chloride: 104 mmol/L (ref 101–111)
Creatinine, Ser: 0.99 mg/dL (ref 0.44–1.00)
GFR calc Af Amer: >60 mL/min (ref >60)
GFR calc non Af Amer: 59 mL/min — ABNORMAL LOW (ref >60)
Glucose, Bld: 106 mg/dL — ABNORMAL HIGH (ref 65–99)
Potassium: 4.3 mmol/L (ref 3.5–5.1)
Sodium: 138 mmol/L (ref 135–145)

## 2016-05-19 NOTE — Progress Notes (Addendum)
During the PAT interview pt states that last time she had surgery Morphine and Toradol was given and it worked well for her. She does not want Fentanyl and Versed. Also states she will not al shower the morning of surgery. Explained to her the need and that we can do the CHG clothes  The morning of surgery. Voices understanding.  PCP is Dr Maurice Small Denies ever seeing cardiologist. Denies ever having a card cath, echo, or stress test. Pt states she does not want a nerve block, encouraged her to speak with Anesthesiologist the day of surgery.

## 2016-05-27 MED ORDER — CEFAZOLIN SODIUM-DEXTROSE 2-4 GM/100ML-% IV SOLN
2.0000 g | INTRAVENOUS | Status: AC
  Administered 2016-05-28: 2 g via INTRAVENOUS
  Filled 2016-05-27: qty 100

## 2016-05-27 NOTE — Anesthesia Preprocedure Evaluation (Addendum)
Anesthesia Evaluation  Patient identified by MRN, date of birth, ID band Patient awake    Reviewed: Allergy & Precautions, H&P , NPO status , Patient's Chart, lab work & pertinent test results, reviewed documented beta blocker date and time   History of Anesthesia Complications (+) PONV  Airway Mallampati: II  TM Distance: >3 FB Neck ROM: full    Dental  (+) Dental Advisory Given, Caps Veneers 2 front upper teeth:   Pulmonary neg pulmonary ROS,    Pulmonary exam normal breath sounds clear to auscultation       Cardiovascular Exercise Tolerance: Good hypertension, Pt. on home beta blockers Normal cardiovascular exam Rhythm:regular Rate:Normal     Neuro/Psych negative neurological ROS  negative psych ROS   GI/Hepatic negative GI ROS, Neg liver ROS,   Endo/Other  negative endocrine ROS  Renal/GU negative Renal ROS  negative genitourinary   Musculoskeletal   Abdominal   Peds  Hematology negative hematology ROS (+)   Anesthesia Other Findings Allergy to versed and fentanyl?  Reproductive/Obstetrics negative OB ROS                           Anesthesia Physical Anesthesia Plan  ASA: II  Anesthesia Plan: General   Post-op Pain Management:    Induction: Intravenous  Airway Management Planned: Oral ETT  Additional Equipment:   Intra-op Plan:   Post-operative Plan: Extubation in OR  Informed Consent: I have reviewed the patients History and Physical, chart, labs and discussed the procedure including the risks, benefits and alternatives for the proposed anesthesia with the patient or authorized representative who has indicated his/her understanding and acceptance.   Dental Advisory Given  Plan Discussed with: CRNA  Anesthesia Plan Comments:        Anesthesia Quick Evaluation

## 2016-05-28 ENCOUNTER — Ambulatory Visit (HOSPITAL_COMMUNITY): Payer: BLUE CROSS/BLUE SHIELD | Admitting: Anesthesiology

## 2016-05-28 ENCOUNTER — Ambulatory Visit (HOSPITAL_COMMUNITY)
Admission: RE | Admit: 2016-05-28 | Discharge: 2016-05-28 | Disposition: A | Discharge status: Home / Self Care | Payer: BLUE CROSS/BLUE SHIELD | Source: Ambulatory Visit | Attending: Orthopedic Surgery | Admitting: Orthopedic Surgery

## 2016-05-28 ENCOUNTER — Encounter (HOSPITAL_COMMUNITY): Payer: Self-pay | Admitting: *Deleted

## 2016-05-28 ENCOUNTER — Encounter (HOSPITAL_COMMUNITY)
Admission: RE | Disposition: A | Discharge status: Home / Self Care | Payer: Self-pay | Source: Ambulatory Visit | Attending: Orthopedic Surgery

## 2016-05-28 DIAGNOSIS — M7551 Bursitis of right shoulder: Secondary | ICD-10-CM | POA: Insufficient documentation

## 2016-05-28 DIAGNOSIS — M19011 Primary osteoarthritis, right shoulder: Secondary | ICD-10-CM | POA: Insufficient documentation

## 2016-05-28 DIAGNOSIS — I10 Essential (primary) hypertension: Secondary | ICD-10-CM | POA: Diagnosis not present

## 2016-05-28 DIAGNOSIS — M75101 Unspecified rotator cuff tear or rupture of right shoulder, not specified as traumatic: Secondary | ICD-10-CM | POA: Diagnosis not present

## 2016-05-28 HISTORY — PX: SHOULDER ARTHROSCOPY: SHX128

## 2016-05-28 SURGERY — ARTHROSCOPY, SHOULDER
Anesthesia: General | Site: Shoulder | Laterality: Right

## 2016-05-28 MED ORDER — PROPOFOL 10 MG/ML IV BOLUS
INTRAVENOUS | Status: AC
  Filled 2016-05-28: qty 20

## 2016-05-28 MED ORDER — LIDOCAINE 2% (20 MG/ML) 5 ML SYRINGE
INTRAMUSCULAR | Status: AC
  Filled 2016-05-28: qty 5

## 2016-05-28 MED ORDER — MORPHINE SULFATE 10 MG/ML IJ SOLN
INTRAMUSCULAR | Status: DC | PRN
  Administered 2016-05-28: 4 mg via INTRAVENOUS

## 2016-05-28 MED ORDER — FENTANYL CITRATE (PF) 250 MCG/5ML IJ SOLN
INTRAMUSCULAR | Status: AC
  Filled 2016-05-28: qty 5

## 2016-05-28 MED ORDER — BUPIVACAINE-EPINEPHRINE (PF) 0.25% -1:200000 IJ SOLN
INTRAMUSCULAR | Status: AC
  Filled 2016-05-28: qty 30

## 2016-05-28 MED ORDER — HYDROMORPHONE HCL 1 MG/ML IJ SOLN
INTRAMUSCULAR | Status: AC
  Filled 2016-05-28: qty 1

## 2016-05-28 MED ORDER — SCOPOLAMINE 1 MG/3DAYS TD PT72
1.0000 | MEDICATED_PATCH | TRANSDERMAL | Status: DC
  Filled 2016-05-28: qty 1

## 2016-05-28 MED ORDER — PHENYLEPHRINE HCL 10 MG/ML IJ SOLN
INTRAMUSCULAR | Status: DC | PRN
  Administered 2016-05-28 (×2): 120 ug via INTRAVENOUS
  Administered 2016-05-28 (×2): 80 ug via INTRAVENOUS

## 2016-05-28 MED ORDER — MORPHINE SULFATE (PF) 2 MG/ML IV SOLN: 1.0000 mg | INTRAVENOUS | Status: DC | PRN

## 2016-05-28 MED ORDER — ROCURONIUM BROMIDE 50 MG/5ML IV SOLN
INTRAVENOUS | Status: AC
  Filled 2016-05-28: qty 1

## 2016-05-28 MED ORDER — ONDANSETRON HCL 4 MG/2ML IJ SOLN
INTRAMUSCULAR | Status: AC
  Filled 2016-05-28: qty 2

## 2016-05-28 MED ORDER — SUGAMMADEX SODIUM 200 MG/2ML IV SOLN
INTRAVENOUS | Status: AC
  Filled 2016-05-28: qty 2

## 2016-05-28 MED ORDER — HYDROCODONE-ACETAMINOPHEN 5-325 MG PO TABS: 1.0000 | ORAL_TABLET | Freq: Four times a day (QID) | ORAL | Status: DC | PRN

## 2016-05-28 MED ORDER — KETOROLAC TROMETHAMINE 30 MG/ML IJ SOLN
INTRAMUSCULAR | Status: AC
  Filled 2016-05-28: qty 1

## 2016-05-28 MED ORDER — 0.9 % SODIUM CHLORIDE (POUR BTL) OPTIME
TOPICAL | Status: DC | PRN
  Administered 2016-05-28: 1000 mL

## 2016-05-28 MED ORDER — ONDANSETRON HCL 4 MG/2ML IJ SOLN
INTRAMUSCULAR | Status: DC | PRN
  Administered 2016-05-28: 4 mg via INTRAVENOUS

## 2016-05-28 MED ORDER — SUGAMMADEX SODIUM 200 MG/2ML IV SOLN
INTRAVENOUS | Status: DC | PRN
  Administered 2016-05-28: 130 mg via INTRAVENOUS

## 2016-05-28 MED ORDER — ONDANSETRON HCL 4 MG/2ML IJ SOLN: INTRAMUSCULAR | Status: DC | PRN

## 2016-05-28 MED ORDER — LACTATED RINGERS IV SOLN
INTRAVENOUS | Status: DC
Start: 2016-05-28 — End: 2016-05-28

## 2016-05-28 MED ORDER — BUPIVACAINE-EPINEPHRINE 0.25% -1:200000 IJ SOLN
INTRAMUSCULAR | Status: DC | PRN
  Administered 2016-05-28: 6 mL

## 2016-05-28 MED ORDER — MORPHINE SULFATE (PF) 4 MG/ML IV SOLN
INTRAVENOUS | Status: AC
  Filled 2016-05-28: qty 3

## 2016-05-28 MED ORDER — KETOROLAC TROMETHAMINE 30 MG/ML IJ SOLN
INTRAMUSCULAR | Status: DC | PRN
  Administered 2016-05-28: 30 mg via INTRAVENOUS

## 2016-05-28 MED ORDER — PHENYLEPHRINE HCL 10 MG/ML IJ SOLN
10.0000 mg | INTRAVENOUS | Status: DC | PRN
  Administered 2016-05-28: 40 ug/min via INTRAVENOUS

## 2016-05-28 MED ORDER — SODIUM CHLORIDE 0.9 % IR SOLN
Status: DC | PRN
  Administered 2016-05-28 (×2): 3000 mL

## 2016-05-28 MED ORDER — LIDOCAINE HCL (CARDIAC) 20 MG/ML IV SOLN
INTRAVENOUS | Status: DC | PRN
  Administered 2016-05-28: 60 mg via INTRAVENOUS

## 2016-05-28 MED ORDER — METHOCARBAMOL 500 MG PO TABS: 500.0000 mg | ORAL_TABLET | Freq: Three times a day (TID) | ORAL | Status: AC | PRN

## 2016-05-28 MED ORDER — LACTATED RINGERS IV SOLN
INTRAVENOUS | Status: DC | PRN
  Administered 2016-05-28 (×2): via INTRAVENOUS

## 2016-05-28 MED ORDER — CHLORHEXIDINE GLUCONATE 4 % EX LIQD
60.0000 mL | Freq: Once | CUTANEOUS | Status: DC
Start: 2016-05-28 — End: 2016-05-28

## 2016-05-28 MED ORDER — MIDAZOLAM HCL 2 MG/2ML IJ SOLN
INTRAMUSCULAR | Status: AC
  Filled 2016-05-28: qty 2

## 2016-05-28 MED ORDER — DEXAMETHASONE SODIUM PHOSPHATE 10 MG/ML IJ SOLN
INTRAMUSCULAR | Status: DC | PRN
  Administered 2016-05-28: 10 mg via INTRAVENOUS

## 2016-05-28 MED ORDER — PROPOFOL 10 MG/ML IV BOLUS
INTRAVENOUS | Status: DC | PRN
  Administered 2016-05-28: 150 mg via INTRAVENOUS

## 2016-05-28 SURGICAL SUPPLY — 63 items
ANCHOR ALL- SUT RC 2 SUT Y-K (Anchor) ×1 IMPLANT
ANCHOR ALL-SUT RC 2 SUT Y-K (Anchor) ×1 IMPLANT
BLADE CUDA 4.2 (BLADE) IMPLANT
BLADE SURG 11 STRL SS (BLADE) ×2 IMPLANT
BLADE SURG 15 STRL LF DISP TIS (BLADE) ×1 IMPLANT
BLADE SURG 15 STRL SS (BLADE) ×2
BUR OVAL 4.0 (BURR) IMPLANT
COVER SURGICAL LIGHT HANDLE (MISCELLANEOUS) ×2 IMPLANT
DRAPE IMP U-DRAPE 54X76 (DRAPES) ×2 IMPLANT
DRAPE INCISE IOBAN 66X45 STRL (DRAPES) IMPLANT
DRAPE ORTHO SPLIT 77X108 STRL (DRAPES) ×4
DRAPE STERI 35X30 U-POUCH (DRAPES) ×2 IMPLANT
DRAPE SURG ORHT 6 SPLT 77X108 (DRAPES) ×2 IMPLANT
DRAPE U-SHAPE 47X51 STRL (DRAPES) ×2 IMPLANT
DRSG EMULSION OIL 3X3 NADH (GAUZE/BANDAGES/DRESSINGS) ×2 IMPLANT
DRSG PAD ABDOMINAL 8X10 ST (GAUZE/BANDAGES/DRESSINGS) ×4 IMPLANT
DURAPREP 26ML APPLICATOR (WOUND CARE) ×2 IMPLANT
ELECT NEEDLE TIP 2.8 STRL (NEEDLE) ×2 IMPLANT
ELECT REM PT RETURN 9FT ADLT (ELECTROSURGICAL)
ELECTRODE REM PT RTRN 9FT ADLT (ELECTROSURGICAL) IMPLANT
GAUZE SPONGE 4X4 12PLY STRL (GAUZE/BANDAGES/DRESSINGS) ×2 IMPLANT
GLOVE BIOGEL PI ORTHO PRO 7.5 (GLOVE) ×1
GLOVE BIOGEL PI ORTHO PRO SZ8 (GLOVE) ×1
GLOVE ORTHO TXT STRL SZ7.5 (GLOVE) ×2 IMPLANT
GLOVE PI ORTHO PRO STRL 7.5 (GLOVE) ×1 IMPLANT
GLOVE PI ORTHO PRO STRL SZ8 (GLOVE) ×1 IMPLANT
GLOVE SURG ORTHO 8.5 STRL (GLOVE) ×2 IMPLANT
GOWN STRL REUS W/ TWL XL LVL3 (GOWN DISPOSABLE) ×4 IMPLANT
GOWN STRL REUS W/TWL XL LVL3 (GOWN DISPOSABLE) ×8
KIT BASIN OR (CUSTOM PROCEDURE TRAY) ×2 IMPLANT
KIT ROOM TURNOVER OR (KITS) ×2 IMPLANT
MANIFOLD NEPTUNE II (INSTRUMENTS) ×2 IMPLANT
NDL SUT 2 .5 CRC MAYO 1.732X (NEEDLE) IMPLANT
NDL SUT 6 .5 CRC .975X.05 MAYO (NEEDLE) ×1 IMPLANT
NEEDLE HYPO 25GX1X1/2 BEV (NEEDLE) ×2 IMPLANT
NEEDLE MAYO TAPER (NEEDLE) ×1
NEEDLE SPNL 18GX3.5 QUINCKE PK (NEEDLE) ×2 IMPLANT
NS IRRIG 1000ML POUR BTL (IV SOLUTION) ×2 IMPLANT
PACK SHOULDER (CUSTOM PROCEDURE TRAY) ×2 IMPLANT
PACK UNIVERSAL I (CUSTOM PROCEDURE TRAY) ×2 IMPLANT
PAD ABD 8X10 STRL (GAUZE/BANDAGES/DRESSINGS) ×2 IMPLANT
PAD ARMBOARD 7.5X6 YLW CONV (MISCELLANEOUS) ×4 IMPLANT
RESECTOR FULL RADIUS 4.2MM (BLADE) ×2 IMPLANT
SET ARTHROSCOPY TUBING (MISCELLANEOUS) ×2
SET ARTHROSCOPY TUBING LN (MISCELLANEOUS) ×1 IMPLANT
SLING ARM LRG ADULT FOAM STRAP (SOFTGOODS) ×2 IMPLANT
SLING ARM MED ADULT FOAM STRAP (SOFTGOODS) IMPLANT
SPONGE GAUZE 4X4 12PLY STER LF (GAUZE/BANDAGES/DRESSINGS) ×2 IMPLANT
STRIP CLOSURE SKIN 1/2X4 (GAUZE/BANDAGES/DRESSINGS) ×2 IMPLANT
SUT HI-FI 2 STRAND C-2 40 (SUTURE) ×4 IMPLANT
SUT VIC AB 0 CT1 27 (SUTURE) ×1
SUT VIC AB 0 CT1 27XBRD ANBCTR (SUTURE) ×1 IMPLANT
SUT VIC AB 0 CT2 27 (SUTURE) IMPLANT
SUT VIC AB 2-0 CT1 27 (SUTURE) ×2
SUT VIC AB 2-0 CT1 TAPERPNT 27 (SUTURE) ×1 IMPLANT
SUT VICRYL 0 CT 1 36IN (SUTURE) IMPLANT
SYR CONTROL 10ML LL (SYRINGE) ×2 IMPLANT
TAPE CLOTH SURG 6X10 WHT LF (GAUZE/BANDAGES/DRESSINGS) ×2 IMPLANT
TOWEL OR 17X24 6PK STRL BLUE (TOWEL DISPOSABLE) ×2 IMPLANT
TOWEL OR 17X26 10 PK STRL BLUE (TOWEL DISPOSABLE) ×2 IMPLANT
TUBE CONNECTING 12X1/4 (SUCTIONS) ×2 IMPLANT
WAND HAND CNTRL MULTIVAC 90 (MISCELLANEOUS) ×2 IMPLANT
WATER STERILE IRR 1000ML POUR (IV SOLUTION) ×2 IMPLANT

## 2016-05-28 NOTE — Op Note (Signed)
NAMEMarland Kitchen  Lynn, Johns NO.:  [id number]  MEDICAL RECORD NO.:  [id number]  LOCATION:  [facility]                         FACILITY:  [facility]  PHYSICIAN:  Doran Heater. Veverly Fells, M.D. DATE OF BIRTH:  1951/12/02  DATE OF PROCEDURE:  05/28/2016 DATE OF DISCHARGE:                              OPERATIVE REPORT   PREOPERATIVE DIAGNOSIS:  Right shoulder pain secondary to arthritis and bursitis, possible cuff tear.  POSTOPERATIVE DIAGNOSES: 1. Right shoulder arthritis. 2. Right shoulder bursitis. 3. Right shoulder partial-thickness rotator cuff tear.  PROCEDURE PERFORMED:  Right shoulder arthroscopy with extensive intra- articular debridement including chondroplasty and removal of scar tissue, arthroscopic bursectomy, no acromioplasty was performed as that had been done previously and then mini-open rotator cuff repair.  ATTENDING SURGEON:  Doran Heater. Veverly Fells, MD  ASSISTANT:  Abbott Pao. Dixon, PA-C who was scrubbed the entire procedure and necessary for satisfactory completion of surgery.  ANESTHESIA:  General anesthesia was used plus local.  ESTIMATED BLOOD LOSS:  Minimal.  FLUID REPLACEMENT:  1200 mL crystalloid.  INSTRUMENT COUNTS:  Correct.  COMPLICATIONS:  There were no complications.  ANTIBIOTICS:  Perioperative antibiotics were given.  INDICATIONS:  The patient is a 65 year old female with worsening right shoulder pain secondary to arthritis and also has extensive bursitis, she is status post prior arthroscopy x3 with prior rotator cuff repair and some postoperative infection.  The patient has had progressive pain that seems it is coming from the subdeltoid subacromial area.  She has responded temporarily to injections.  MRI scan indicating possible supraspinatus tear.  Given the failure of conservative management, including greater than 6 months of physical therapy, injections, modification activity, we discussed potential for arthroscopic evaluation and potential  treatment of whatever pathology she had.  Risks and benefits of surgery were discussed.  Informed consent obtained.  DESCRIPTION OF PROCEDURE:  After an adequate level of anesthesia was achieved, the patient was positioned in the modified beach chair position.  Right shoulder correctly identified, examined under anesthesia.  External rotation to 80 with a good end point, internal rotation about 45.  Arm abducted, forward elevation is about 160.  There was crepitus with abduction, internal-external rotation indicating some arthritic change in the shoulder.  She also had some mild laxity in the shoulder, basically multidirectional instability.  After a sterile prep and draping, exam of the arm, we did reverify the time-out, we then entered the shoulder through standard arthroscopic portals including anterior, posterior, and lateral portals.  I identified some synovitis in the shoulder joint.  We went ahead and removed some of that with suction shaver and the ArthroCare unit.  The articular cartilage on the glenoid actually looked pretty good.  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  Subscap not really well visualized anteriorly had seemed to just move as a whole soft tissue unit, but not nice-looking rolled edge of the superior most aspect of the subscapularis.  There was a rent in the supraspinatus tendon almost felt like a fissure line where we could see deep back into the tendon, there looked like a partial thickness tear or  non-healed rent.  The posterior aspect of the cuff looked to be in pretty good shape including teres minor and posterior infraspinatus.  Articular cartilage on the humeral head had some grade 3 areas, some __________ and 1 couple spots where there was some visible bone, grade 4.  These were more localized than expected, I thought this to be more  generalized arthritis, I thought in general her articular cartilage looked little bit better than I was expecting.  No loose bodies encountered in the inferior axillary pouch.  We then placed a scope in subacromial space. Bursectomy was performed.  There was extensive bursitis noted and on the dorsal surface of the rotator cuff of the supraspinatus, we definitely saw an area where there was partial thickness bursal sided tear.  The remainder of the cuff looked to be in pretty good shape, looked at the underside of the acromion, there was 1 little area anterolaterally that we just used the shaver on to smooth that down, but no significant acromioplasty was performed.  Scar release was performed there with the ArthroCare and the shaver.  We then concluded the arthroscopic portion of procedure, made a small mini open incision starting at the anterolateral border of the acromion staying distally in the raphe between the anterolateral head, this was where the prior incision was. We developed that using needle-tip Bovie, placed Arthrex retractor, released the scar tissue.  There was quite a bit scar between the deltoid and the anterior rotator interval area that was released using Mayo scissors.  We finished our bursectomy, was able to visualize the damaged rotator cuff area, this felt very thin, attenuated and I felt like we could mobilize tissue from posterior to anterior, so we ellipsed that out with a knife 15 blade scalpel, was able to get a good thickness of rotator cuff tendon and mobilized that and then removed that from posterior to anterior and I felt like that closed that gap and gave [place] nice thick tissue.  We prepared the greater tuberosity with rongeur.  We placed Y-Knot RC anchor at the articular margin, brought that double loaded #2 Hi-Fi anchor up through in a mattress fashion for the tendon. We also took the free edge suture down through drill hole and out through the lateral  and over tied over the lateral humerus and did side- to-side right there at the rotator interval.  So, we had a very nice robust rotator cuff repair very thick, ranged the shoulder, no impingement was noted.  We thoroughly irrigated and then closed the deltoid anatomically with 0 Vicryl suture followed by 2-0 Vicryl for subcutaneous closure, 4-0 Monocryl for skin and portals.  Steri-Strips applied followed by sterile dressing.  The patient tolerated the surgery well.     Doran Heater. Veverly Fells, M.D.     SRN/MEDQ  D:  05/28/2016  T:  05/28/2016  Job:  [id number]

## 2016-05-28 NOTE — Anesthesia Procedure Notes (Signed)
Procedure Name: Intubation Date/Time: 05/28/2016 7:32 AM Performed by: Lance Coon Pre-anesthesia Checklist: Emergency Drugs available, Timeout performed, Patient identified, Suction available and Patient being monitored Patient Re-evaluated:Patient Re-evaluated prior to inductionOxygen Delivery Method: Circle system utilized Preoxygenation: Pre-oxygenation with 100% oxygen Intubation Type: IV induction Ventilation: Mask ventilation without difficulty Laryngoscope Size: Miller and 2 Grade View: Grade I Tube type: Oral Tube size: 7.0 mm Number of attempts: 1 Airway Equipment and Method: Stylet Placement Confirmation: ETT inserted through vocal cords under direct vision,  positive ETCO2 and breath sounds checked- equal and bilateral Secured at: 21 cm Tube secured with: Tape Dental Injury: Teeth and Oropharynx as per pre-operative assessment

## 2016-05-28 NOTE — Interval H&P Note (Signed)
History and Physical Interval Note:  05/28/2016 7:08 AM  [name]  has presented today for surgery, with the diagnosis of RIGHT SHOULDER PAIN WITH BURSITIS AND ARTHRITIS  The various methods of treatment have been discussed with the patient and family. After consideration of risks, benefits and other options for treatment, the patient has consented to  Procedure(s): ARTHROSCOPY SHOULDER (Right) as a surgical intervention .  The patient's history has been reviewed, patient examined, no change in status, stable for surgery.  I have reviewed the patient's chart and labs.  Questions were answered to the patient's satisfaction.     Clester Chlebowski,STEVEN R

## 2016-05-28 NOTE — Brief Op Note (Signed)
05/28/2016  9:30 AM  PATIENT:  [name]  65 y.o. female  PRE-OPERATIVE DIAGNOSIS:  RIGHT SHOULDER PAIN WITH BURSITIS AND ARTHRITIS  POST-OPERATIVE DIAGNOSIS:  RIGHT SHOULDER PAIN WITH BURSITIS AND ARTHRITIS, ROTATOR CUFF TEAR PROCEDURE:  Procedure(s): RIGHT SHOULDER ARTHROSCOPY (Right), extensive intra-articular debridement and chondroplasty, scar removal, bursectomy, mini-open Rotator Cuff Repair  SURGEON:  Surgeon(s) and Role:    * Netta Cedars, MD - Primary  PHYSICIAN ASSISTANT:   ASSISTANTS: Ventura Bruns, PA-C   ANESTHESIA:   general  EBL:  Total I/O In: 1300 [I.V.:1300] Out: 50 [Blood:50]  BLOOD ADMINISTERED:none  DRAINS: none   LOCAL MEDICATIONS USED:  MARCAINE     SPECIMEN:  No Specimen  DISPOSITION OF SPECIMEN:  N/A  COUNTS:  YES  TOURNIQUET:  * No tourniquets in log *  DICTATION: .Other Dictation: Dictation Number [id number]  PLAN OF CARE: Discharge to home after PACU  PATIENT DISPOSITION:  PACU - hemodynamically stable.   Delay start of Pharmacological VTE agent (>24hrs) due to surgical blood loss or risk of bleeding: not applicable

## 2016-05-28 NOTE — Discharge Instructions (Signed)
Keep sterile dressing intact through the weekend, then ok to change to Band Aids.  Do not get wet in the shower for 7 days, then ok to get the wound wet - keep covered  No lifting pushing or pulling with the right arm.  Use the sling out of the house and a pillow under the arm in the home to rest the shoulder in the abducted position.  Start exercises today - pendulums, lap slides or pillow slides,  Rotation (internal and external) hug and hitch hike with the right hand, supine assisted forward elevation,  Gentle isometric strengthening.  Follow up in the office in two weeks(after July 2nd)  [phone]  Brad will be available to answer questions if they arise before follow up appointment

## 2016-05-28 NOTE — Transfer of Care (Signed)
Immediate Anesthesia Transfer of Care Note  Patient: Lynn Johns  Procedure(s) Performed: Procedure(s): RIGHT SHOULDER ARTHROSCOPY (Right)  Patient Location: PACU  Anesthesia Type:General  Level of Consciousness: awake, alert , oriented and patient cooperative  Airway & Oxygen Therapy: Patient Spontanous Breathing  Post-op Assessment: Report given to RN, Post -op Vital signs reviewed and stable and Patient moving all extremities X 4  Post vital signs: Reviewed and stable  Last Vitals:  Filed Vitals:   05/28/16 0606  BP: 133/64  Pulse: 81  Temp: 36.4 C  Resp: 20    Last Pain: There were no vitals filed for this visit.       Complications: No apparent anesthesia complications

## 2016-05-28 NOTE — Progress Notes (Signed)
Patient refuses the scopalamine patch.

## 2016-05-28 NOTE — Progress Notes (Signed)
Orthopedic Tech Progress Note Patient Details:  Lynn Johns [birth year] [id number]  Ortho Devices Type of Ortho Device: Shoulder abduction pillow Ortho Device/Splint Interventions: Ordered As ordered by Dr. Thermon Leyland, Lynn Johns 05/28/2016, 8:47 AM

## 2016-05-28 NOTE — Anesthesia Postprocedure Evaluation (Signed)
Anesthesia Post Note  Patient: Lynn Johns  Procedure(s) Performed: Procedure(s) (LRB): RIGHT SHOULDER ARTHROSCOPY (Right)  Patient location during evaluation: PACU Anesthesia Type: General Level of consciousness: awake and alert Pain management: pain level controlled Vital Signs Assessment: post-procedure vital signs reviewed and stable Respiratory status: spontaneous breathing, nonlabored ventilation, respiratory function stable and patient connected to nasal cannula oxygen Cardiovascular status: blood pressure returned to baseline and stable Postop Assessment: no signs of nausea or vomiting Anesthetic complications: no    Last Vitals:  Filed Vitals:   05/28/16 1002 05/28/16 1012  BP:  124/66  Pulse:  73  Temp: 36.5 C   Resp:  18    Last Pain:  Filed Vitals:   05/28/16 1026  PainSc: 0-No pain                 Tiajuana Amass

## 2016-05-31 ENCOUNTER — Encounter (HOSPITAL_COMMUNITY): Payer: Self-pay | Admitting: Orthopedic Surgery

## 2016-05-31 DIAGNOSIS — M79621 Pain in right upper arm: Secondary | ICD-10-CM | POA: Diagnosis not present

## 2016-05-31 DIAGNOSIS — M25511 Pain in right shoulder: Secondary | ICD-10-CM | POA: Diagnosis not present

## 2016-05-31 DIAGNOSIS — M25411 Effusion, right shoulder: Secondary | ICD-10-CM | POA: Diagnosis not present

## 2016-05-31 DIAGNOSIS — M25611 Stiffness of right shoulder, not elsewhere classified: Secondary | ICD-10-CM | POA: Diagnosis not present

## 2016-06-02 DIAGNOSIS — M25611 Stiffness of right shoulder, not elsewhere classified: Secondary | ICD-10-CM | POA: Diagnosis not present

## 2016-06-02 DIAGNOSIS — M79621 Pain in right upper arm: Secondary | ICD-10-CM | POA: Diagnosis not present

## 2016-06-02 DIAGNOSIS — M25511 Pain in right shoulder: Secondary | ICD-10-CM | POA: Diagnosis not present

## 2016-06-02 DIAGNOSIS — M25411 Effusion, right shoulder: Secondary | ICD-10-CM | POA: Diagnosis not present

## 2016-06-03 DIAGNOSIS — M5030 Other cervical disc degeneration, unspecified cervical region: Secondary | ICD-10-CM | POA: Diagnosis not present

## 2016-06-03 DIAGNOSIS — M150 Primary generalized (osteo)arthritis: Secondary | ICD-10-CM | POA: Diagnosis not present

## 2016-06-03 DIAGNOSIS — M5136 Other intervertebral disc degeneration, lumbar region: Secondary | ICD-10-CM | POA: Diagnosis not present

## 2016-06-03 DIAGNOSIS — M2550 Pain in unspecified joint: Secondary | ICD-10-CM | POA: Diagnosis not present

## 2016-06-04 DIAGNOSIS — M79621 Pain in right upper arm: Secondary | ICD-10-CM | POA: Diagnosis not present

## 2016-06-04 DIAGNOSIS — M25411 Effusion, right shoulder: Secondary | ICD-10-CM | POA: Diagnosis not present

## 2016-06-04 DIAGNOSIS — M25611 Stiffness of right shoulder, not elsewhere classified: Secondary | ICD-10-CM | POA: Diagnosis not present

## 2016-06-04 DIAGNOSIS — M25511 Pain in right shoulder: Secondary | ICD-10-CM | POA: Diagnosis not present

## 2016-06-07 DIAGNOSIS — M25411 Effusion, right shoulder: Secondary | ICD-10-CM | POA: Diagnosis not present

## 2016-06-07 DIAGNOSIS — M79621 Pain in right upper arm: Secondary | ICD-10-CM | POA: Diagnosis not present

## 2016-06-07 DIAGNOSIS — M25511 Pain in right shoulder: Secondary | ICD-10-CM | POA: Diagnosis not present

## 2016-06-07 DIAGNOSIS — M25611 Stiffness of right shoulder, not elsewhere classified: Secondary | ICD-10-CM | POA: Diagnosis not present

## 2016-06-09 DIAGNOSIS — M25411 Effusion, right shoulder: Secondary | ICD-10-CM | POA: Diagnosis not present

## 2016-06-09 DIAGNOSIS — M25611 Stiffness of right shoulder, not elsewhere classified: Secondary | ICD-10-CM | POA: Diagnosis not present

## 2016-06-09 DIAGNOSIS — M79621 Pain in right upper arm: Secondary | ICD-10-CM | POA: Diagnosis not present

## 2016-06-09 DIAGNOSIS — M25511 Pain in right shoulder: Secondary | ICD-10-CM | POA: Diagnosis not present

## 2016-06-11 DIAGNOSIS — M79621 Pain in right upper arm: Secondary | ICD-10-CM | POA: Diagnosis not present

## 2016-06-11 DIAGNOSIS — M25611 Stiffness of right shoulder, not elsewhere classified: Secondary | ICD-10-CM | POA: Diagnosis not present

## 2016-06-11 DIAGNOSIS — M25511 Pain in right shoulder: Secondary | ICD-10-CM | POA: Diagnosis not present

## 2016-06-11 DIAGNOSIS — M25411 Effusion, right shoulder: Secondary | ICD-10-CM | POA: Diagnosis not present

## 2016-06-16 DIAGNOSIS — M25611 Stiffness of right shoulder, not elsewhere classified: Secondary | ICD-10-CM | POA: Diagnosis not present

## 2016-06-16 DIAGNOSIS — M25411 Effusion, right shoulder: Secondary | ICD-10-CM | POA: Diagnosis not present

## 2016-06-16 DIAGNOSIS — M25511 Pain in right shoulder: Secondary | ICD-10-CM | POA: Diagnosis not present

## 2016-06-16 DIAGNOSIS — M79621 Pain in right upper arm: Secondary | ICD-10-CM | POA: Diagnosis not present

## 2016-06-18 DIAGNOSIS — M25411 Effusion, right shoulder: Secondary | ICD-10-CM | POA: Diagnosis not present

## 2016-06-18 DIAGNOSIS — M25511 Pain in right shoulder: Secondary | ICD-10-CM | POA: Diagnosis not present

## 2016-06-18 DIAGNOSIS — M79621 Pain in right upper arm: Secondary | ICD-10-CM | POA: Diagnosis not present

## 2016-06-18 DIAGNOSIS — M25611 Stiffness of right shoulder, not elsewhere classified: Secondary | ICD-10-CM | POA: Diagnosis not present

## 2016-06-28 DIAGNOSIS — M25611 Stiffness of right shoulder, not elsewhere classified: Secondary | ICD-10-CM | POA: Diagnosis not present

## 2016-06-28 DIAGNOSIS — M25511 Pain in right shoulder: Secondary | ICD-10-CM | POA: Diagnosis not present

## 2016-06-28 DIAGNOSIS — M25411 Effusion, right shoulder: Secondary | ICD-10-CM | POA: Diagnosis not present

## 2016-06-28 DIAGNOSIS — M79621 Pain in right upper arm: Secondary | ICD-10-CM | POA: Diagnosis not present

## 2016-06-30 DIAGNOSIS — M25611 Stiffness of right shoulder, not elsewhere classified: Secondary | ICD-10-CM | POA: Diagnosis not present

## 2016-06-30 DIAGNOSIS — M79621 Pain in right upper arm: Secondary | ICD-10-CM | POA: Diagnosis not present

## 2016-06-30 DIAGNOSIS — M25511 Pain in right shoulder: Secondary | ICD-10-CM | POA: Diagnosis not present

## 2016-06-30 DIAGNOSIS — M25411 Effusion, right shoulder: Secondary | ICD-10-CM | POA: Diagnosis not present

## 2016-07-05 DIAGNOSIS — M25611 Stiffness of right shoulder, not elsewhere classified: Secondary | ICD-10-CM | POA: Diagnosis not present

## 2016-07-05 DIAGNOSIS — M25411 Effusion, right shoulder: Secondary | ICD-10-CM | POA: Diagnosis not present

## 2016-07-05 DIAGNOSIS — M25511 Pain in right shoulder: Secondary | ICD-10-CM | POA: Diagnosis not present

## 2016-07-05 DIAGNOSIS — M79621 Pain in right upper arm: Secondary | ICD-10-CM | POA: Diagnosis not present

## 2016-07-09 DIAGNOSIS — M79621 Pain in right upper arm: Secondary | ICD-10-CM | POA: Diagnosis not present

## 2016-07-09 DIAGNOSIS — M25611 Stiffness of right shoulder, not elsewhere classified: Secondary | ICD-10-CM | POA: Diagnosis not present

## 2016-07-09 DIAGNOSIS — M25411 Effusion, right shoulder: Secondary | ICD-10-CM | POA: Diagnosis not present

## 2016-07-09 DIAGNOSIS — M25511 Pain in right shoulder: Secondary | ICD-10-CM | POA: Diagnosis not present

## 2016-07-19 DIAGNOSIS — I129 Hypertensive chronic kidney disease with stage 1 through stage 4 chronic kidney disease, or unspecified chronic kidney disease: Secondary | ICD-10-CM | POA: Diagnosis not present

## 2016-07-19 DIAGNOSIS — E876 Hypokalemia: Secondary | ICD-10-CM | POA: Diagnosis not present

## 2016-07-19 DIAGNOSIS — N183 Chronic kidney disease, stage 3 (moderate): Secondary | ICD-10-CM | POA: Diagnosis not present

## 2016-07-21 DIAGNOSIS — M25511 Pain in right shoulder: Secondary | ICD-10-CM | POA: Diagnosis not present

## 2016-07-21 DIAGNOSIS — M25611 Stiffness of right shoulder, not elsewhere classified: Secondary | ICD-10-CM | POA: Diagnosis not present

## 2016-07-21 DIAGNOSIS — M79621 Pain in right upper arm: Secondary | ICD-10-CM | POA: Diagnosis not present

## 2016-07-21 DIAGNOSIS — M25411 Effusion, right shoulder: Secondary | ICD-10-CM | POA: Diagnosis not present

## 2016-07-23 DIAGNOSIS — M25411 Effusion, right shoulder: Secondary | ICD-10-CM | POA: Diagnosis not present

## 2016-07-23 DIAGNOSIS — M25611 Stiffness of right shoulder, not elsewhere classified: Secondary | ICD-10-CM | POA: Diagnosis not present

## 2016-07-23 DIAGNOSIS — M25511 Pain in right shoulder: Secondary | ICD-10-CM | POA: Diagnosis not present

## 2016-07-23 DIAGNOSIS — M79621 Pain in right upper arm: Secondary | ICD-10-CM | POA: Diagnosis not present

## 2016-07-28 DIAGNOSIS — M25411 Effusion, right shoulder: Secondary | ICD-10-CM | POA: Diagnosis not present

## 2016-07-28 DIAGNOSIS — I10 Essential (primary) hypertension: Secondary | ICD-10-CM | POA: Diagnosis not present

## 2016-07-28 DIAGNOSIS — M79621 Pain in right upper arm: Secondary | ICD-10-CM | POA: Diagnosis not present

## 2016-07-28 DIAGNOSIS — M25611 Stiffness of right shoulder, not elsewhere classified: Secondary | ICD-10-CM | POA: Diagnosis not present

## 2016-07-28 DIAGNOSIS — M25511 Pain in right shoulder: Secondary | ICD-10-CM | POA: Diagnosis not present

## 2016-07-29 DIAGNOSIS — H60399 Other infective otitis externa, unspecified ear: Secondary | ICD-10-CM | POA: Diagnosis not present

## 2016-07-29 DIAGNOSIS — H7201 Central perforation of tympanic membrane, right ear: Secondary | ICD-10-CM | POA: Diagnosis not present

## 2016-07-29 DIAGNOSIS — H8102 Meniere's disease, left ear: Secondary | ICD-10-CM | POA: Diagnosis not present

## 2016-07-29 DIAGNOSIS — H905 Unspecified sensorineural hearing loss: Secondary | ICD-10-CM | POA: Diagnosis not present

## 2016-07-30 DIAGNOSIS — M25511 Pain in right shoulder: Secondary | ICD-10-CM | POA: Diagnosis not present

## 2016-07-30 DIAGNOSIS — M25411 Effusion, right shoulder: Secondary | ICD-10-CM | POA: Diagnosis not present

## 2016-07-30 DIAGNOSIS — M79621 Pain in right upper arm: Secondary | ICD-10-CM | POA: Diagnosis not present

## 2016-07-30 DIAGNOSIS — M25611 Stiffness of right shoulder, not elsewhere classified: Secondary | ICD-10-CM | POA: Diagnosis not present

## 2016-08-02 DIAGNOSIS — M79621 Pain in right upper arm: Secondary | ICD-10-CM | POA: Diagnosis not present

## 2016-08-02 DIAGNOSIS — M25411 Effusion, right shoulder: Secondary | ICD-10-CM | POA: Diagnosis not present

## 2016-08-02 DIAGNOSIS — M25511 Pain in right shoulder: Secondary | ICD-10-CM | POA: Diagnosis not present

## 2016-08-02 DIAGNOSIS — M25611 Stiffness of right shoulder, not elsewhere classified: Secondary | ICD-10-CM | POA: Diagnosis not present

## 2016-08-04 DIAGNOSIS — M25511 Pain in right shoulder: Secondary | ICD-10-CM | POA: Diagnosis not present

## 2016-08-04 DIAGNOSIS — M79621 Pain in right upper arm: Secondary | ICD-10-CM | POA: Diagnosis not present

## 2016-08-04 DIAGNOSIS — M25411 Effusion, right shoulder: Secondary | ICD-10-CM | POA: Diagnosis not present

## 2016-08-04 DIAGNOSIS — M25611 Stiffness of right shoulder, not elsewhere classified: Secondary | ICD-10-CM | POA: Diagnosis not present

## 2016-08-11 DIAGNOSIS — M25611 Stiffness of right shoulder, not elsewhere classified: Secondary | ICD-10-CM | POA: Diagnosis not present

## 2016-08-11 DIAGNOSIS — M25511 Pain in right shoulder: Secondary | ICD-10-CM | POA: Diagnosis not present

## 2016-08-11 DIAGNOSIS — M79621 Pain in right upper arm: Secondary | ICD-10-CM | POA: Diagnosis not present

## 2016-08-11 DIAGNOSIS — M25411 Effusion, right shoulder: Secondary | ICD-10-CM | POA: Diagnosis not present

## 2016-08-18 DIAGNOSIS — M25411 Effusion, right shoulder: Secondary | ICD-10-CM | POA: Diagnosis not present

## 2016-08-18 DIAGNOSIS — M25611 Stiffness of right shoulder, not elsewhere classified: Secondary | ICD-10-CM | POA: Diagnosis not present

## 2016-08-18 DIAGNOSIS — M79621 Pain in right upper arm: Secondary | ICD-10-CM | POA: Diagnosis not present

## 2016-08-18 DIAGNOSIS — M25511 Pain in right shoulder: Secondary | ICD-10-CM | POA: Diagnosis not present

## 2016-08-19 DIAGNOSIS — M25512 Pain in left shoulder: Secondary | ICD-10-CM | POA: Diagnosis not present

## 2016-08-26 DIAGNOSIS — M25411 Effusion, right shoulder: Secondary | ICD-10-CM | POA: Diagnosis not present

## 2016-08-26 DIAGNOSIS — M25511 Pain in right shoulder: Secondary | ICD-10-CM | POA: Diagnosis not present

## 2016-08-26 DIAGNOSIS — M79621 Pain in right upper arm: Secondary | ICD-10-CM | POA: Diagnosis not present

## 2016-08-26 DIAGNOSIS — M25611 Stiffness of right shoulder, not elsewhere classified: Secondary | ICD-10-CM | POA: Diagnosis not present

## 2016-09-01 DIAGNOSIS — M79621 Pain in right upper arm: Secondary | ICD-10-CM | POA: Diagnosis not present

## 2016-09-01 DIAGNOSIS — M25411 Effusion, right shoulder: Secondary | ICD-10-CM | POA: Diagnosis not present

## 2016-09-01 DIAGNOSIS — M25611 Stiffness of right shoulder, not elsewhere classified: Secondary | ICD-10-CM | POA: Diagnosis not present

## 2016-09-01 DIAGNOSIS — M25511 Pain in right shoulder: Secondary | ICD-10-CM | POA: Diagnosis not present

## 2016-09-06 DIAGNOSIS — M25511 Pain in right shoulder: Secondary | ICD-10-CM | POA: Diagnosis not present

## 2016-09-06 DIAGNOSIS — M25411 Effusion, right shoulder: Secondary | ICD-10-CM | POA: Diagnosis not present

## 2016-09-06 DIAGNOSIS — M79621 Pain in right upper arm: Secondary | ICD-10-CM | POA: Diagnosis not present

## 2016-09-06 DIAGNOSIS — M25611 Stiffness of right shoulder, not elsewhere classified: Secondary | ICD-10-CM | POA: Diagnosis not present

## 2016-09-14 DIAGNOSIS — M25512 Pain in left shoulder: Secondary | ICD-10-CM | POA: Diagnosis not present

## 2016-09-17 DIAGNOSIS — I10 Essential (primary) hypertension: Secondary | ICD-10-CM | POA: Diagnosis not present

## 2016-09-21 DIAGNOSIS — M25511 Pain in right shoulder: Secondary | ICD-10-CM | POA: Diagnosis not present

## 2016-09-21 DIAGNOSIS — Z4789 Encounter for other orthopedic aftercare: Secondary | ICD-10-CM | POA: Diagnosis not present

## 2016-09-24 ENCOUNTER — Inpatient Hospital Stay (HOSPITAL_COMMUNITY)
Admission: EM | Admit: 2016-09-24 | Discharge: 2016-09-27 | DRG: 392 | Disposition: A | Discharge status: Home / Self Care | Payer: BLUE CROSS/BLUE SHIELD | Attending: General Surgery | Admitting: General Surgery

## 2016-09-24 ENCOUNTER — Encounter (HOSPITAL_COMMUNITY): Payer: Self-pay | Admitting: *Deleted

## 2016-09-24 ENCOUNTER — Emergency Department (HOSPITAL_COMMUNITY): Payer: BLUE CROSS/BLUE SHIELD

## 2016-09-24 DIAGNOSIS — Z888 Allergy status to other drugs, medicaments and biological substances status: Secondary | ICD-10-CM

## 2016-09-24 DIAGNOSIS — E876 Hypokalemia: Secondary | ICD-10-CM | POA: Diagnosis not present

## 2016-09-24 DIAGNOSIS — K5720 Diverticulitis of large intestine with perforation and abscess without bleeding: Principal | ICD-10-CM | POA: Diagnosis present

## 2016-09-24 DIAGNOSIS — Z880 Allergy status to penicillin: Secondary | ICD-10-CM

## 2016-09-24 DIAGNOSIS — N182 Chronic kidney disease, stage 2 (mild): Secondary | ICD-10-CM | POA: Diagnosis not present

## 2016-09-24 DIAGNOSIS — K668 Other specified disorders of peritoneum: Secondary | ICD-10-CM | POA: Diagnosis present

## 2016-09-24 DIAGNOSIS — Z8249 Family history of ischemic heart disease and other diseases of the circulatory system: Secondary | ICD-10-CM | POA: Diagnosis not present

## 2016-09-24 DIAGNOSIS — M1990 Unspecified osteoarthritis, unspecified site: Secondary | ICD-10-CM | POA: Diagnosis not present

## 2016-09-24 DIAGNOSIS — R1031 Right lower quadrant pain: Secondary | ICD-10-CM | POA: Diagnosis not present

## 2016-09-24 DIAGNOSIS — I129 Hypertensive chronic kidney disease with stage 1 through stage 4 chronic kidney disease, or unspecified chronic kidney disease: Secondary | ICD-10-CM | POA: Diagnosis present

## 2016-09-24 DIAGNOSIS — Z79899 Other long term (current) drug therapy: Secondary | ICD-10-CM | POA: Diagnosis not present

## 2016-09-24 DIAGNOSIS — K5732 Diverticulitis of large intestine without perforation or abscess without bleeding: Secondary | ICD-10-CM | POA: Diagnosis not present

## 2016-09-24 LAB — CBC
HCT: 46.6 % — ABNORMAL HIGH (ref 36.0–46.0)
Hemoglobin: 16.5 g/dL — ABNORMAL HIGH (ref 12.0–15.0)
MCH: 31.6 pg (ref 26.0–34.0)
MCHC: 35.4 g/dL (ref 30.0–36.0)
MCV: 89.3 fL (ref 78.0–100.0)
Platelets: 368 10*3/uL (ref 150–400)
RBC: 5.22 MIL/uL — ABNORMAL HIGH (ref 3.87–5.11)
RDW: 13.1 % (ref 11.5–15.5)
WBC: 12.1 10*3/uL — ABNORMAL HIGH (ref 4.0–10.5)

## 2016-09-24 LAB — COMPREHENSIVE METABOLIC PANEL
ALT: 33 U/L (ref 14–54)
AST: 28 U/L (ref 15–41)
Albumin: 4.2 g/dL (ref 3.5–5.0)
Alkaline Phosphatase: 80 U/L (ref 38–126)
Anion gap: 9 (ref 5–15)
BUN: 23 mg/dL — ABNORMAL HIGH (ref 6–20)
CO2: 26 mmol/L (ref 22–32)
Calcium: 9.8 mg/dL (ref 8.9–10.3)
Chloride: 100 mmol/L — ABNORMAL LOW (ref 101–111)
Creatinine, Ser: 1.1 mg/dL — ABNORMAL HIGH (ref 0.44–1.00)
GFR calc Af Amer: 60 mL/min — ABNORMAL LOW (ref >60)
GFR calc non Af Amer: 52 mL/min — ABNORMAL LOW (ref >60)
Glucose, Bld: 135 mg/dL — ABNORMAL HIGH (ref 65–99)
Potassium: 4.1 mmol/L (ref 3.5–5.1)
Sodium: 135 mmol/L (ref 135–145)
Total Bilirubin: 1 mg/dL (ref 0.3–1.2)
Total Protein: 6.9 g/dL (ref 6.5–8.1)

## 2016-09-24 LAB — URINALYSIS, ROUTINE W REFLEX MICROSCOPIC
Bilirubin Urine: NEGATIVE
Glucose, UA: NEGATIVE mg/dL
Hgb urine dipstick: NEGATIVE
Ketones, ur: NEGATIVE mg/dL
Nitrite: NEGATIVE
Protein, ur: NEGATIVE mg/dL
Specific Gravity, Urine: 1.02 (ref 1.005–1.030)
pH: 5.5 (ref 5.0–8.0)

## 2016-09-24 LAB — URINE MICROSCOPIC-ADD ON

## 2016-09-24 LAB — LIPASE, BLOOD: Lipase: 23 U/L (ref 11–51)

## 2016-09-24 MED ORDER — MAGNESIUM OXIDE 400 (241.3 MG) MG PO TABS
400.0000 mg | ORAL_TABLET | Freq: Every day | ORAL | Status: DC
  Administered 2016-09-24 – 2016-09-27 (×4): 400 mg via ORAL
  Filled 2016-09-24 (×4): qty 1

## 2016-09-24 MED ORDER — METHOCARBAMOL 1000 MG/10ML IJ SOLN
500.0000 mg | Freq: Three times a day (TID) | INTRAVENOUS | Status: DC | PRN
  Filled 2016-09-24: qty 5

## 2016-09-24 MED ORDER — ONDANSETRON 4 MG PO TBDP: 4.0000 mg | ORAL_TABLET | Freq: Four times a day (QID) | ORAL | Status: DC | PRN

## 2016-09-24 MED ORDER — PIPERACILLIN-TAZOBACTAM 3.375 G IVPB 30 MIN: 3.3750 g | Freq: Once | INTRAVENOUS | Status: DC

## 2016-09-24 MED ORDER — METRONIDAZOLE IN NACL 5-0.79 MG/ML-% IV SOLN
500.0000 mg | Freq: Four times a day (QID) | INTRAVENOUS | Status: DC
  Filled 2016-09-24 (×3): qty 100

## 2016-09-24 MED ORDER — LOSARTAN POTASSIUM 50 MG PO TABS
25.0000 mg | ORAL_TABLET | Freq: Every day | ORAL | Status: DC
  Administered 2016-09-24 – 2016-09-27 (×4): 25 mg via ORAL
  Filled 2016-09-24 (×4): qty 1

## 2016-09-24 MED ORDER — DEXTROSE 5 % IV SOLN
2.0000 g | INTRAVENOUS | Status: DC
  Administered 2016-09-24: 2 g via INTRAVENOUS
  Filled 2016-09-24: qty 2

## 2016-09-24 MED ORDER — IOPAMIDOL (ISOVUE-300) INJECTION 61%
INTRAVENOUS | Status: AC
  Filled 2016-09-24: qty 100

## 2016-09-24 MED ORDER — SPIRONOLACTONE 50 MG PO TABS
50.0000 mg | ORAL_TABLET | Freq: Every day | ORAL | Status: DC
  Administered 2016-09-24 – 2016-09-27 (×4): 50 mg via ORAL
  Filled 2016-09-24 (×4): qty 1

## 2016-09-24 MED ORDER — ENOXAPARIN SODIUM 40 MG/0.4ML ~~LOC~~ SOLN
40.0000 mg | SUBCUTANEOUS | Status: DC
  Filled 2016-09-24 (×2): qty 0.4

## 2016-09-24 MED ORDER — FLUCONAZOLE IN SODIUM CHLORIDE 200-0.9 MG/100ML-% IV SOLN
200.0000 mg | Freq: Once | INTRAVENOUS | Status: DC
  Filled 2016-09-24: qty 100

## 2016-09-24 MED ORDER — CARVEDILOL 3.125 MG PO TABS
3.1250 mg | ORAL_TABLET | Freq: Two times a day (BID) | ORAL | Status: DC
  Administered 2016-09-24 – 2016-09-27 (×7): 3.125 mg via ORAL
  Filled 2016-09-24 (×8): qty 1

## 2016-09-24 MED ORDER — PIPERACILLIN-TAZOBACTAM 3.375 G IVPB
3.3750 g | Freq: Three times a day (TID) | INTRAVENOUS | Status: DC
  Administered 2016-09-24 – 2016-09-27 (×9): 3.375 g via INTRAVENOUS
  Filled 2016-09-24 (×11): qty 50

## 2016-09-24 MED ORDER — MORPHINE SULFATE (PF) 4 MG/ML IV SOLN
4.0000 mg | Freq: Once | INTRAVENOUS | Status: AC
  Administered 2016-09-24: 4 mg via INTRAVENOUS
  Filled 2016-09-24: qty 1

## 2016-09-24 MED ORDER — MORPHINE SULFATE (PF) 2 MG/ML IV SOLN: 1.0000 mg | INTRAVENOUS | Status: DC | PRN

## 2016-09-24 MED ORDER — CARVEDILOL 3.125 MG PO TABS: 3.1250 mg | ORAL_TABLET | Freq: Two times a day (BID) | ORAL | Status: DC

## 2016-09-24 MED ORDER — ONDANSETRON 4 MG PO TBDP
4.0000 mg | ORAL_TABLET | Freq: Once | ORAL | Status: AC
  Administered 2016-09-24: 4 mg via ORAL

## 2016-09-24 MED ORDER — TRAMADOL HCL 50 MG PO TABS
50.0000 mg | ORAL_TABLET | Freq: Four times a day (QID) | ORAL | Status: DC | PRN
  Administered 2016-09-24 – 2016-09-26 (×3): 50 mg via ORAL
  Filled 2016-09-24 (×3): qty 1

## 2016-09-24 MED ORDER — ONDANSETRON 4 MG PO TBDP
ORAL_TABLET | ORAL | Status: AC
  Filled 2016-09-24: qty 1

## 2016-09-24 MED ORDER — POTASSIUM CHLORIDE IN NACL 40-0.9 MEQ/L-% IV SOLN
INTRAVENOUS | Status: DC
  Administered 2016-09-24 – 2016-09-27 (×7): 100 mL/h via INTRAVENOUS
  Filled 2016-09-24 (×9): qty 1000

## 2016-09-24 MED ORDER — ONDANSETRON HCL 4 MG/2ML IJ SOLN
4.0000 mg | Freq: Four times a day (QID) | INTRAMUSCULAR | Status: DC | PRN
Start: 2016-09-24 — End: 2016-09-27
  Administered 2016-09-24: 4 mg via INTRAVENOUS
  Filled 2016-09-24: qty 2

## 2016-09-24 MED ORDER — SODIUM CHLORIDE 0.9 % IV BOLUS (SEPSIS)
1000.0000 mL | Freq: Once | INTRAVENOUS | Status: AC
  Administered 2016-09-24: 1000 mL via INTRAVENOUS

## 2016-09-24 MED ORDER — ONDANSETRON HCL 4 MG/2ML IJ SOLN
4.0000 mg | Freq: Once | INTRAMUSCULAR | Status: AC
  Administered 2016-09-24: 4 mg via INTRAVENOUS
  Filled 2016-09-24: qty 2

## 2016-09-24 MED ORDER — DIPHENHYDRAMINE HCL 12.5 MG/5ML PO ELIX: 12.5000 mg | ORAL_SOLUTION | Freq: Four times a day (QID) | ORAL | Status: DC | PRN

## 2016-09-24 MED ORDER — DIPHENHYDRAMINE HCL 50 MG/ML IJ SOLN: 12.5000 mg | Freq: Four times a day (QID) | INTRAMUSCULAR | Status: DC | PRN

## 2016-09-24 NOTE — ED Notes (Signed)
Pt uip to BR ambulatory, son in  Room made aware of room # 6 N 59

## 2016-09-24 NOTE — ED Provider Notes (Signed)
Patient care assumed at shift change. Patient here with abdominal pain, nausea, vomiting. CT scan concerning for bowel perforation from diverticulitis. Gen. surgery consulted and patient updated of findings of studies and recommendation of admission for further treatment. She was given Zosyn due to allergy to fluoroquinolone.     Quintella Reichert, MD 09/24/16 [phone]

## 2016-09-24 NOTE — ED Provider Notes (Signed)
[facility] DEPT Provider Note   CSN: [id number] Arrival date & time: 09/24/16  0230     History   Chief Complaint Chief Complaint  Patient presents with  . Abdominal Pain  . Nausea  . Emesis    HPI Katee Verbeek is a 65 y.o. female.  The history is provided by the patient.  Abdominal Pain   This is a new problem. Episode onset: 4 hours PTA. The problem occurs constantly. The problem has been gradually worsening. The pain is located in the RLQ and periumbilical region. The pain is severe. Associated symptoms include nausea and vomiting. Pertinent negatives include fever, hematochezia, constipation and dysuria. The symptoms are aggravated by certain positions and palpation. Relieved by: rest.  Emesis   Associated symptoms include abdominal pain. Pertinent negatives include no fever.  Patient with h/o hypertension presents with acute onset of umbilical and RLQ abdominal pain She reports she has never had this previously She reports nonbloody vomiting as well No diarrhea She had otherwise been well prior to this episode  Past Medical History:  Diagnosis Date  . Arthritis    lumbar radiculopathy   . Cystocele   . Diverticulosis   . Hemorrhoids   . Hypertension   . Kidney disease   . PONV (postoperative nausea and vomiting)    needs motion sickness patch prior to surgery,pt. remarks that she had hypotension for 2 days after surgery, she thinks related to medicine from anesth., also reports allergy to VERSED & Fentanyl, damage to estachian tubes from prev. ET tube during lumbar fusion , myringotomy tubes in place since 06/2012  . Tubular adenoma of colon     Patient Active Problem List   Diagnosis Date Noted  . Degeneration of intervertebral disc, site unspecified 04/18/2012    Past Surgical History:  Procedure Laterality Date  . ABDOMINAL HYSTERECTOMY  July 2006   with bilateral salpingo-oophorectomy  . AIKEN OSTEOTOMY    . anterior lumbar decompression and  arthrodesis     L5-S1  . ANTERIOR LUMBAR FUSION  05/19/2012   Procedure: ANTERIOR LUMBAR FUSION 1 LEVEL;  Surgeon: Kristeen Miss, MD;  Location: [facility];  Service: Neurosurgery;  Laterality: N/A;  Lumbar three-fourAnterior lumbar interbody fusion  . BUNIONECTOMY  06/04/14  . cervical disc removal  11-2011   C5 and C6  . COLONOSCOPY    . MICRODISCECTOMY LUMBAR  2010  . MYRINGOTOMY  06/2012   another set since then   . REPLACEMENT DISC ANTERIOR LUMBAR SPINE    . ROTATOR CUFF REPAIR Left 12/2010, 03/2011  . rotator cuff surgery Right 06/2010, 07/2010  . SHOULDER ARTHROSCOPY Right 05/28/2016   Procedure: RIGHT SHOULDER ARTHROSCOPY;  Surgeon: Netta Cedars, MD;  Location: [facility];  Service: Orthopedics;  Laterality: Right;  . STRABISMUS SURGERY Right 03/01/2014   Procedure: REPAIR STRABISMUS RIGHT EYE ;  Surgeon: Derry Skill, MD;  Location: [facility];  Service: Ophthalmology;  Laterality: Right;  . TUBAL LIGATION  1986    OB History    No data available       Home Medications    Prior to Admission medications   Medication Sig Start Date End Date Taking? Authorizing Provider  atenolol (TENORMIN) 25 MG tablet Take 25 mg by mouth daily.     Historical Provider, MD  fexofenadine (ALLEGRA) 180 MG tablet Take 180 mg by mouth daily.    Historical Provider, MD  HYDROcodone-acetaminophen (NORCO) 5-325 MG tablet Take 1 tablet by mouth every 6 (six) hours as  needed for moderate pain. 05/28/16   Netta Cedars, MD  losartan (COZAAR) 25 MG tablet Take 25 mg by mouth daily.    Historical Provider, MD  Magnesium 500 MG CAPS Take 500 mg by mouth daily.    Historical Provider, MD  magnesium gluconate (MAGONATE) 500 MG tablet Take 500 mg by mouth daily.    Historical Provider, MD  methocarbamol (ROBAXIN) 500 MG tablet Take 500 mg by mouth daily.     Historical Provider, MD  methocarbamol (ROBAXIN) 500 MG tablet Take 1 tablet (500 mg total) by mouth 3 (three) times daily as needed. 05/28/16    Netta Cedars, MD  potassium chloride SA (K-DUR,KLOR-CON) 20 MEQ tablet Take 20 mEq by mouth daily.  12/28/14   Historical Provider, MD  spironolactone (ALDACTONE) 50 MG tablet Take 50 mg by mouth daily.    Historical Provider, MD  VAGIFEM 10 MCG TABS vaginal tablet Place 1 tablet vaginally 2 (two) times a week.  06/09/14   Historical Provider, MD    Family History Family History  Problem Relation Age of Onset  . Heart disease Father   . Hypertension Father   . COPD Father   . Dementia Father   . Heart attack Mother   . Colon cancer Neg Hx     Social History Social History  Substance Use Topics  . Smoking status: Never Smoker  . Smokeless tobacco: Not on file  . Alcohol use 0.6 oz/week    1 Glasses of wine per week     Comment: 1 glass of wine weekly     Allergies   Clindamycin/lincomycin; Fentanyl; Levaquin [levofloxacin]; Valium [diazepam]; Versed [midazolam]; and Penicillins   Review of Systems Review of Systems  Constitutional: Negative for fever.  Cardiovascular: Negative for chest pain.  Gastrointestinal: Positive for abdominal pain, nausea and vomiting. Negative for blood in stool, constipation and hematochezia.  Genitourinary: Negative for dysuria.  All other systems reviewed and are negative.    Physical Exam Updated Vital Signs BP 110/88 (BP Location: Right Arm)   Pulse 115   Temp 97.8 F (36.6 C) (Oral)   Resp 22   Ht 5\' 5"  (1.651 m)   Wt 62.6 kg   SpO2 99%   BMI 22.96 kg/m   Physical Exam  CONSTITUTIONAL: Well developed/well nourished HEAD: Normocephalic/atraumatic EYES: EOMI/PERRL, no icterus ENMT: Mucous membranes dry NECK: supple no meningeal signs SPINE/BACK:entire spine nontender CV: S1/S2 noted, no murmurs/rubs/gallops noted LUNGS: Lungs are clear to auscultation bilaterally, no apparent distress ABDOMEN: soft, moderate umbilical and RLQ tenderness, no rebound or guarding, bowel sounds noted throughout abdomen GU:no cva  tenderness NEURO: Pt is awake/alert/appropriate, moves all extremitiesx4.  No facial droop.   EXTREMITIES: pulses normal/equal, full ROM SKIN: warm, color normal PSYCH: no abnormalities of mood noted, alert and oriented to situation  ED Treatments / Results  Labs (all labs ordered are listed, but only abnormal results are displayed) Labs Reviewed  COMPREHENSIVE METABOLIC PANEL - Abnormal; Notable for the following:       Result Value   Chloride 100 (*)    Glucose, Bld 135 (*)    BUN 23 (*)    Creatinine, Ser 1.10 (*)    GFR calc non Af Amer 52 (*)    GFR calc Af Amer 60 (*)    All other components within normal limits  CBC - Abnormal; Notable for the following:    WBC 12.1 (*)    RBC 5.22 (*)    Hemoglobin 16.5 (*)  HCT 46.6 (*)    All other components within normal limits  LIPASE, BLOOD  URINALYSIS, ROUTINE W REFLEX MICROSCOPIC (NOT AT [facility])    EKG  EKG Interpretation  Date/Time:  Friday September 24 2016 05:29:34 EDT Ventricular Rate:  101 PR Interval:    QRS Duration: 92 QT Interval:  323 QTC Calculation: 419 R Axis:   5 Text Interpretation:  Sinus tachycardia Ventricular premature complex Probable left atrial enlargement Low voltage, extremity leads artifact noted Confirmed by Christy Gentles  MD, Tavares Levinson ([id number]) on 09/24/2016 5:39:58 AM       Radiology No results found.  Procedures Procedures (including critical care time)  Medications Ordered in ED Medications  ondansetron (ZOFRAN-ODT) 4 MG disintegrating tablet (not administered)  iopamidol (ISOVUE-300) 61 % injection (not administered)  ondansetron (ZOFRAN-ODT) disintegrating tablet 4 mg (4 mg Oral Given 09/24/16 0252)  morphine 4 MG/ML injection 4 mg (4 mg Intravenous Given 09/24/16 0543)  ondansetron (ZOFRAN) injection 4 mg (4 mg Intravenous Given 09/24/16 0543)  sodium chloride 0.9 % bolus 1,000 mL (1,000 mLs Intravenous New Bag/Given 09/24/16 0609)     Initial Impression / Assessment and Plan / ED  Course  I have reviewed the triage vital signs and the nursing notes.  Pertinent labs results that were available during my care of the patient were reviewed by me and considered in my medical decision making (see chart for details).  Clinical Course    6:47 AM Pt here with abrupt onset of lower abdominal pain with nausea/vomiting She is more comfortable after meds but still will need CT imaging as I am concerned for acute abd pathology Signed out to dr rees to f/u on CT imaging   Final Clinical Impressions(s) / ED Diagnoses   Final diagnoses:  None    New Prescriptions New Prescriptions   No medications on file     Ripley Fraise, MD 09/24/16 [phone]

## 2016-09-24 NOTE — Progress Notes (Addendum)
1045 received pt from Ed. A&O x4, anxious. Pt's son at the bedside. Denies abd pain at this time, just sore. Abd soft, active bowel sound. Pt is requesting for her home meds, Ultram, Celebrex, Magnesium and Diflucan while she is on Abt. Kathlee Nations PA notified, also aware that pt wants to talk to the DR.

## 2016-09-24 NOTE — ED Notes (Signed)
Admitting pa with [facility] into speak to pt and son

## 2016-09-24 NOTE — ED Notes (Signed)
Hold pipercilllian per dr orders

## 2016-09-24 NOTE — ED Triage Notes (Signed)
Pt c/o abdominal pain and n/v since 2200 tonight. Pt has vomited x 4, denies diarrhea, denies constipation.

## 2016-09-24 NOTE — ED Notes (Signed)
Patient transported to CT at this time via ED stretcher. Pt in no apparent distress at this time.   

## 2016-09-24 NOTE — H&P (Signed)
[facility] Admission Note  Lynn Johns Aug 17, 1951  [id number].    Requesting MD: Dr. Ralene Bathe Chief Complaint/Reason for Consult: diverticulitis complicated by small pneumoperitoneum   HPI:  This is a very anxious 65 year-old female with a PMH of CKDII, HTN, diverticulosis and back pain who presented to [facility] with abdominal pain. Patient states that abdominal pain started last night, described as sharp periumbilical pain with radiation to her RLQ. The pain is limited to her abdomen, it described as sharp. She had 4 episodes of vomiting this morning. Denies sick contacts, fever, chills, CP, SOB, diarrhea, constipation, hematemesis, melena, hematochezia, or urinary symptoms. She has a surgical history of abdominal hysterectomy, should surgery, multiple spinal fusions. Last colonscopy ~ 15 years ago significant for diverticulosis, no polyps. Patient is very adamant, even tearful, about maintaining home medication regimen while in the hospital, concerns for HTN and hypokalemia. Denies tobacco use. Drinks small amount of wine on fridays. Denies illicit drug use. Reports a history of anesthesia medications making her "crazy". Multiple allergies to antibiotics - Levaquin/Cipro causing severe tendonitis, PCN's causing vaginal candidiasis requiring diflucan.   ED work-up: tachycardia 100-124 bpm, WBC 12.1, CT scan significant for perforated sigmoid diverticulitis with small pneumoperitoneum and non loculated ascites.   ROS: All systems reviewed and otherwise negative except for as above  Family History  Problem Relation Age of Onset  . Heart disease Father   . Hypertension Father   . COPD Father   . Dementia Father   . Heart attack Mother   . Colon cancer Neg Hx     Past Medical History:  Diagnosis Date  . Arthritis    lumbar radiculopathy   . Cystocele   . Diverticulosis   . Hemorrhoids   . Hypertension   . Kidney disease   . PONV (postoperative nausea and vomiting)    needs  motion sickness patch prior to surgery,pt. remarks that she had hypotension for 2 days after surgery, she thinks related to medicine from anesth., also reports allergy to VERSED & Fentanyl, damage to estachian tubes from prev. ET tube during lumbar fusion , myringotomy tubes in place since 06/2012  . Tubular adenoma of colon     Past Surgical History:  Procedure Laterality Date  . ABDOMINAL HYSTERECTOMY  July 2006   with bilateral salpingo-oophorectomy  . AIKEN OSTEOTOMY    . anterior lumbar decompression and arthrodesis     L5-S1  . ANTERIOR LUMBAR FUSION  05/19/2012   Procedure: ANTERIOR LUMBAR FUSION 1 LEVEL;  Surgeon: Kristeen Miss, MD;  Location: [facility];  Service: Neurosurgery;  Laterality: N/A;  Lumbar three-fourAnterior lumbar interbody fusion  . BUNIONECTOMY  06/04/14  . cervical disc removal  11-2011   C5 and C6  . COLONOSCOPY    . MICRODISCECTOMY LUMBAR  2010  . MYRINGOTOMY  06/2012   another set since then   . REPLACEMENT DISC ANTERIOR LUMBAR SPINE    . ROTATOR CUFF REPAIR Left 12/2010, 03/2011  . rotator cuff surgery Right 06/2010, 07/2010  . SHOULDER ARTHROSCOPY Right 05/28/2016   Procedure: RIGHT SHOULDER ARTHROSCOPY;  Surgeon: Netta Cedars, MD;  Location: [facility];  Service: Orthopedics;  Laterality: Right;  . STRABISMUS SURGERY Right 03/01/2014   Procedure: REPAIR STRABISMUS RIGHT EYE ;  Surgeon: Derry Skill, MD;  Location: [facility];  Service: Ophthalmology;  Laterality: Right;  . TUBAL LIGATION  1986    Social History:  reports that she has never smoked. She does not have any smokeless  tobacco history on file. She reports that she drinks about 0.6 oz of alcohol per week . She reports that she does not use drugs.  Allergies:  Allergies  Allergen Reactions  . Ciprofloxacin     Dr does not want her to take it do to the levaquin giving her tendonitis in her legs  . Clindamycin/Lincomycin Nausea And Vomiting  . Fentanyl     Rash, insomnia, uncontrollable  crying  . Levaquin [Levofloxacin] Other (See Comments)    Tendonitis - very bad reaction  . Valium [Diazepam] Other (See Comments)    Makes her cry and be happy at the same time  . Versed [Midazolam]     Rash, insomnia, memory loss  . Penicillins Rash and Other (See Comments)    Vaginal infection      (Not in a hospital admission)  Blood pressure 124/63, pulse 117, temperature 97.8 F (36.6 C), temperature source Oral, resp. rate 23, height 5' 5" (1.651 m), weight 138 lb (62.6 kg), SpO2 99 %. Physical Exam: General: well-appearing, WD/WN white female who is laying in bed in NAD HEENT: head is normocephalic, atraumatic. Heart: tachycardic, regular rhythm, No obvious murmurs, gallops, or rubs noted.  Palpable pedal pulses bilaterally Lungs: CTAB, no wheezes, rhonchi, or rales noted.  Respiratory effort nonlabored Abd: soft, mildy TTP of suprapubic region and RLQ, ND, +BS, no masses, hernias, or organomegaly MS: all 4 extremities are symmetrical with no cyanosis, clubbing, or edema. Skin: warm and dry with no masses, lesions, or rashes Psych: anxious affect. Neuro: A&Ox3, moves all extremities spontaneously, normal speech  Results for orders placed or performed during the hospital encounter of 09/24/16 (from the past 48 hour(s))  Lipase, blood     Status: None   Collection Time: 09/24/16  2:51 AM  Result Value Ref Range   Lipase 23 11 - 51 U/L  Comprehensive metabolic panel     Status: Abnormal   Collection Time: 09/24/16  2:51 AM  Result Value Ref Range   Sodium 135 135 - 145 mmol/L   Potassium 4.1 3.5 - 5.1 mmol/L   Chloride 100 (L) 101 - 111 mmol/L   CO2 26 22 - 32 mmol/L   Glucose, Bld 135 (H) 65 - 99 mg/dL   BUN 23 (H) 6 - 20 mg/dL   Creatinine, Ser 1.10 (H) 0.44 - 1.00 mg/dL   Calcium 9.8 8.9 - 10.3 mg/dL   Total Protein 6.9 6.5 - 8.1 g/dL   Albumin 4.2 3.5 - 5.0 g/dL   AST 28 15 - 41 U/L   ALT 33 14 - 54 U/L   Alkaline Phosphatase 80 38 - 126 U/L   Total Bilirubin  1.0 0.3 - 1.2 mg/dL   GFR calc non Af Amer 52 (L) >60 mL/min   GFR calc Af Amer 60 (L) >60 mL/min    Comment: (NOTE) The eGFR has been calculated using the CKD EPI equation. This calculation has not been validated in all clinical situations. eGFR's persistently <60 mL/min signify possible Chronic Kidney Disease.    Anion gap 9 5 - 15  CBC     Status: Abnormal   Collection Time: 09/24/16  2:51 AM  Result Value Ref Range   WBC 12.1 (H) 4.0 - 10.5 K/uL   RBC 5.22 (H) 3.87 - 5.11 MIL/uL   Hemoglobin 16.5 (H) 12.0 - 15.0 g/dL   HCT 46.6 (H) 36.0 - 46.0 %   MCV 89.3 78.0 - 100.0 fL   MCH 31.6 26.0 - 34.0  pg   MCHC 35.4 30.0 - 36.0 g/dL   RDW 13.1 11.5 - 15.5 %   Platelets 368 150 - 400 K/uL  Urinalysis, Routine w reflex microscopic     Status: Abnormal   Collection Time: 09/24/16  7:50 AM  Result Value Ref Range   Color, Urine YELLOW YELLOW   APPearance CLOUDY (A) CLEAR   Specific Gravity, Urine 1.020 1.005 - 1.030   pH 5.5 5.0 - 8.0   Glucose, UA NEGATIVE NEGATIVE mg/dL   Hgb urine dipstick NEGATIVE NEGATIVE   Bilirubin Urine NEGATIVE NEGATIVE   Ketones, ur NEGATIVE NEGATIVE mg/dL   Protein, ur NEGATIVE NEGATIVE mg/dL   Nitrite NEGATIVE NEGATIVE   Leukocytes, UA SMALL (A) NEGATIVE  Urine microscopic-add on     Status: Abnormal   Collection Time: 09/24/16  7:50 AM  Result Value Ref Range   Squamous Epithelial / LPF 6-30 (A) NONE SEEN   WBC, UA 0-5 0 - 5 WBC/hpf   RBC / HPF 0-5 0 - 5 RBC/hpf   Bacteria, UA RARE (A) NONE SEEN   Ct Abdomen Pelvis Wo Contrast  Result Date: 09/24/2016 CLINICAL DATA:  Right lower quadrant abdominal pain. EXAM: CT ABDOMEN AND PELVIS WITHOUT CONTRAST TECHNIQUE: Multidetector CT imaging of the abdomen and pelvis was performed following the standard protocol without IV contrast. COMPARISON:  None. FINDINGS: Lower chest:  No contributory findings. Hepatobiliary: No focal liver abnormality.No evidence of biliary obstruction or stone. Pancreas:  Unremarkable. Spleen: Unremarkable. Adrenals/Urinary Tract: Negative adrenals. No hydronephrosis or stone. 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 Unremarkable bladder. Stomach/Bowel: There is inflammation and few bubbles of extraluminal gas around a sigmoid diverticulum consistent with diverticulitis. This is best visualized on 203:34. Case is complicated by a few bubbles of anti dependent pneumoperitoneum. Vascular/Lymphatic: No acute vascular abnormality. No mass or adenopathy. Reproductive:Hysterectomy. Other: Small non loculated ascites in the pelvis. No indication of abscess. Musculoskeletal: L3-4, L4-5, and L5-S1 solid bony fusion. These results were called by telephone at the time of interpretation on 09/24/2016 at 7:46 am to Dr. Ripley Fraise , who verbally acknowledged these results. IMPRESSION: Perforated sigmoid diverticulitis with small pneumoperitoneum and non loculated ascites. Electronically Signed   By: Monte Fantasia M.D.   On: 09/24/2016 07:47   Assessment/Plan Perforated diverticulitis with small pneumoperitoneum - hospital admission - IV Rocephin and Flagyl - bowel rest  - IVF w/ KCl - serial abdominal exams - repeat labs in AM,   Hypertension - continue home meds CKD II - BUN 23, SCr 1.10 Hypokalemia - will replete potassium and magnesium via IVF Arthritis  If abdominal pain and vitals worsen, may need exploratory laparotomy with partial colectomy/colostomy. Conservative management with IV antibiotics and abdominal exams for now, possible repeat imaging.   Jill Alexanders, [facility] 09/24/2016, 9:31 AM Pager: [phone] Consults: [phone] Mon-Fri 7:00 am-4:30 pm Sat-Sun 7:00 am-11:30 am

## 2016-09-25 ENCOUNTER — Encounter (HOSPITAL_COMMUNITY): Payer: Self-pay

## 2016-09-25 DIAGNOSIS — Z79899 Other long term (current) drug therapy: Secondary | ICD-10-CM | POA: Diagnosis not present

## 2016-09-25 DIAGNOSIS — R1031 Right lower quadrant pain: Secondary | ICD-10-CM | POA: Diagnosis present

## 2016-09-25 DIAGNOSIS — Z8249 Family history of ischemic heart disease and other diseases of the circulatory system: Secondary | ICD-10-CM | POA: Diagnosis not present

## 2016-09-25 DIAGNOSIS — Z888 Allergy status to other drugs, medicaments and biological substances status: Secondary | ICD-10-CM | POA: Diagnosis not present

## 2016-09-25 DIAGNOSIS — K5720 Diverticulitis of large intestine with perforation and abscess without bleeding: Secondary | ICD-10-CM | POA: Diagnosis not present

## 2016-09-25 DIAGNOSIS — M1990 Unspecified osteoarthritis, unspecified site: Secondary | ICD-10-CM | POA: Diagnosis present

## 2016-09-25 DIAGNOSIS — I129 Hypertensive chronic kidney disease with stage 1 through stage 4 chronic kidney disease, or unspecified chronic kidney disease: Secondary | ICD-10-CM | POA: Diagnosis present

## 2016-09-25 DIAGNOSIS — N182 Chronic kidney disease, stage 2 (mild): Secondary | ICD-10-CM | POA: Diagnosis present

## 2016-09-25 DIAGNOSIS — Z880 Allergy status to penicillin: Secondary | ICD-10-CM | POA: Diagnosis not present

## 2016-09-25 DIAGNOSIS — E876 Hypokalemia: Secondary | ICD-10-CM | POA: Diagnosis present

## 2016-09-25 DIAGNOSIS — K668 Other specified disorders of peritoneum: Secondary | ICD-10-CM | POA: Diagnosis present

## 2016-09-25 LAB — BASIC METABOLIC PANEL
Anion gap: 6 (ref 5–15)
BUN: 13 mg/dL (ref 6–20)
CO2: 23 mmol/L (ref 22–32)
Calcium: 8.7 mg/dL — ABNORMAL LOW (ref 8.9–10.3)
Chloride: 108 mmol/L (ref 101–111)
Creatinine, Ser: 1.1 mg/dL — ABNORMAL HIGH (ref 0.44–1.00)
GFR calc Af Amer: 60 mL/min — ABNORMAL LOW (ref >60)
GFR calc non Af Amer: 52 mL/min — ABNORMAL LOW (ref >60)
Glucose, Bld: 97 mg/dL (ref 65–99)
Potassium: 5 mmol/L (ref 3.5–5.1)
Sodium: 137 mmol/L (ref 135–145)

## 2016-09-25 LAB — MAGNESIUM: Magnesium: 2.1 mg/dL (ref 1.7–2.4)

## 2016-09-25 LAB — CBC
HCT: 40.9 % (ref 36.0–46.0)
Hemoglobin: 13.5 g/dL (ref 12.0–15.0)
MCH: 30.3 pg (ref 26.0–34.0)
MCHC: 33 g/dL (ref 30.0–36.0)
MCV: 91.9 fL (ref 78.0–100.0)
Platelets: 285 10*3/uL (ref 150–400)
RBC: 4.45 MIL/uL (ref 3.87–5.11)
RDW: 13.8 % (ref 11.5–15.5)
WBC: 17 10*3/uL — ABNORMAL HIGH (ref 4.0–10.5)

## 2016-09-25 NOTE — Progress Notes (Addendum)
Subjective: Pt aggressive this am. Seems very annoyed as I walked into room. Son at [facility]. States IV beeped all night and that she has more pain from the IV site than abd. No fever. No nausea. States she is leaving Sunday at noon regardless. No bm.   Objective: Vital signs in last 24 hours: Temp:  [98.9 F (37.2 C)-99.4 F (37.4 C)] 98.9 F (37.2 C) (10/14 0507) Pulse Rate:  [94-111] 99 (10/14 0507) Resp:  [18-21] 18 (10/14 0507) BP: (90-121)/(48-77) 90/74 (10/14 0507) SpO2:  [94 %-99 %] 94 % (10/14 0507) Weight:  [62.9 kg (138 lb 10.7 oz)] 62.9 kg (138 lb 10.7 oz) (10/13 1042) Last BM Date: 09/23/16  Intake/Output from previous day: 10/13 0701 - 10/14 0700 In: 1836.7 [I.V.:1786.7; IV Piggyback:50] Out: -  Intake/Output this shift: No intake/output data recorded.  Sitting in chair, visibly annoyed, nontoxic cta b/l Reg Soft, nd, nt IV site ok. No redness. No sign of infiltration  Lab Results:   Recent Labs  09/24/16 0251 09/25/16 0742  WBC 12.1* 17.0*  HGB 16.5* 13.5  HCT 46.6* 40.9  PLT 368 285   BMET  Recent Labs  09/24/16 0251 09/25/16 0742  NA 135 137  K 4.1 5.0  CL 100* 108  CO2 26 23  GLUCOSE 135* 97  BUN 23* 13  CREATININE 1.10* 1.10*  CALCIUM 9.8 8.7*   PT/INR No results for input(s): LABPROT, INR in the last 72 hours. ABG No results for input(s): PHART, HCO3 in the last 72 hours.  Invalid input(s): PCO2, PO2  Studies/Results: Ct Abdomen Pelvis Wo Contrast  Result Date: 09/24/2016 CLINICAL DATA:  Right lower quadrant abdominal pain. EXAM: CT ABDOMEN AND PELVIS WITHOUT CONTRAST TECHNIQUE: Multidetector CT imaging of the abdomen and pelvis was performed following the standard protocol without IV contrast. COMPARISON:  None. FINDINGS: Lower chest:  No contributory findings. Hepatobiliary: No focal liver abnormality.No evidence of biliary obstruction or stone. Pancreas: Unremarkable. Spleen: Unremarkable. Adrenals/Urinary Tract: Negative  adrenals. No hydronephrosis or stone. 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 Unremarkable bladder. Stomach/Bowel: There is inflammation and few bubbles of extraluminal gas around a sigmoid diverticulum consistent with diverticulitis. This is best visualized on 203:34. Case is complicated by a few bubbles of anti dependent pneumoperitoneum. Vascular/Lymphatic: No acute vascular abnormality. No mass or adenopathy. Reproductive:Hysterectomy. Other: Small non loculated ascites in the pelvis. No indication of abscess. Musculoskeletal: L3-4, L4-5, and L5-S1 solid bony fusion. These results were called by telephone at the time of interpretation on 09/24/2016 at 7:46 am to Dr. DONALD WICKLINE , who verbally acknowledged these results. IMPRESSION: Perforated sigmoid diverticulitis with small pneumoperitoneum and non loculated ascites. Electronically Signed   By: Jonathon  Watts M.D.   On: 09/24/2016 07:47    Anti-infectives: Anti-infectives    Start     Dose/Rate Route Frequency Ordered Stop   09/24/16 1445  fluconazole (DIFLUCAN) IVPB 200 mg     200 mg 100 mL/hr over 60 Minutes Intravenous  Once 09/24/16 1432     09/24/16 1200  piperacillin-tazobactam (ZOSYN) IVPB 3.375 g     3.375 g 12.5 mL/hr over 240 Minutes Intravenous Every 8 hours 09/24/16 1114     10 /13/17 0945  cefTRIAXone (ROCEPHIN) 2 g in dextrose 5 % 50 mL IVPB  Status:  Discontinued     2 g 100 mL/hr over 30 Minutes Intravenous Every 24 hours 09/24/16 0930 09/24/16 1114   09/24/16 0945  metroNIDAZOLE (FLAGYL) IVPB 500 mg  Status:  Discontinued     500 mg 100 mL/hr over 60 Minutes Intravenous Every 6 hours 09/24/16 0930 09/24/16 1114   09/24/16 0800  piperacillin-tazobactam (ZOSYN) IVPB 3.375 g  Status:  Discontinued     3.375 g 100 mL/hr over 30 Minutes Intravenous  Once 09/24/16 0756 09/24/16 0930      Assessment/Plan: Sigmoid diverticulitis with mild  perf  Although wbc up, she o/w looks very good. Not ill appearing, soft, nt abd.  Will start clears Discussed wbc with pt and son.  Didn't rec repeat ct today since she otherwise looks great.  Told her we would need to watch wbc and that I could not guarantee she would be safe for dc on Sunday. Explained that our goal to effectively manage her diveritculitis with abx to avoid surgery since she is adamant about no colostomy. Advised that if she left before we thought she was safe to leave her condition could worsen and she could re-present with septic shock and need emergency surgery where a bag would be only option. " I would rather die than have a bag"   Explained that our goal is to get pt out of the hospital as quickly and safely as possible and our goal was not to prolong her hospitalization.   Cont home meds for BP, etc  Leighton Ruff. Redmond Pulling, MD, FACS General, Bariatric, & Minimally Invasive Surgery [facility], [place]   LOS: 0 days    Gayland Curry 09/25/2016

## 2016-09-26 LAB — CBC WITH DIFFERENTIAL/PLATELET
Basophils Absolute: 0 10*3/uL (ref 0.0–0.1)
Basophils Relative: 0 %
Eosinophils Absolute: 0 10*3/uL (ref 0.0–0.7)
Eosinophils Relative: 0 %
HCT: 40.5 % (ref 36.0–46.0)
Hemoglobin: 13.4 g/dL (ref 12.0–15.0)
Lymphocytes Relative: 7 %
Lymphs Abs: 1.2 10*3/uL (ref 0.7–4.0)
MCH: 30.2 pg (ref 26.0–34.0)
MCHC: 33.1 g/dL (ref 30.0–36.0)
MCV: 91.4 fL (ref 78.0–100.0)
Monocytes Absolute: 0.8 10*3/uL (ref 0.1–1.0)
Monocytes Relative: 5 %
Neutro Abs: 15 10*3/uL — ABNORMAL HIGH (ref 1.7–7.7)
Neutrophils Relative %: 88 %
Platelets: 305 10*3/uL (ref 150–400)
RBC: 4.43 MIL/uL (ref 3.87–5.11)
RDW: 13.6 % (ref 11.5–15.5)
WBC: 17.1 10*3/uL — ABNORMAL HIGH (ref 4.0–10.5)

## 2016-09-26 NOTE — Progress Notes (Signed)
  Subjective: Pt tol FLD with abd pain.  Had normal BM this AM   Objective: Vital signs in last 24 hours: Temp:  [97.4 F (36.3 C)-98.4 F (36.9 C)] 98.1 F (36.7 C) (10/15 0539) Pulse Rate:  [94-105] 94 (10/15 0539) Resp:  [16-17] 16 (10/15 0539) BP: (104-112)/(62-65) 112/65 (10/15 0539) SpO2:  [95 %-100 %] 96 % (10/15 0539) Last BM Date: 09/24/16  Intake/Output from previous day: 10/14 0701 - 10/15 0700 In: 3393.3 [P.O.:1720; I.V.:1523.3; IV Piggyback:150] Out: -  Intake/Output this shift: Total I/O In: 250 [P.O.:250] Out: -   General appearance: alert and cooperative GI: soft, min abd pain to deep palp  Lab Results:   Recent Labs  09/25/16 0742 09/26/16 0516  WBC 17.0* 17.1*  HGB 13.5 13.4  HCT 40.9 40.5  PLT 285 305   BMET  Recent Labs  09/24/16 0251 09/25/16 0742  NA 135 137  K 4.1 5.0  CL 100* 108  CO2 26 23  GLUCOSE 135* 97  BUN 23* 13  CREATININE 1.10* 1.10*  CALCIUM 9.8 8.7*    Anti-infectives: Anti-infectives    Start     Dose/Rate Route Frequency Ordered Stop   09/24/16 1445  fluconazole (DIFLUCAN) IVPB 200 mg     200 mg 100 mL/hr over 60 Minutes Intravenous  Once 09/24/16 1432     09/24/16 1200  piperacillin-tazobactam (ZOSYN) IVPB 3.375 g     3.375 g 12.5 mL/hr over 240 Minutes Intravenous Every 8 hours 09/24/16 1114     09/24/16 0945  cefTRIAXone (ROCEPHIN) 2 g in dextrose 5 % 50 mL IVPB  Status:  Discontinued     2 g 100 mL/hr over 30 Minutes Intravenous Every 24 hours 09/24/16 0930 09/24/16 1114   09/24/16 0945  metroNIDAZOLE (FLAGYL) IVPB 500 mg  Status:  Discontinued     500 mg 100 mL/hr over 60 Minutes Intravenous Every 6 hours 09/24/16 0930 09/24/16 1114   09/24/16 0800  piperacillin-tazobactam (ZOSYN) IVPB 3.375 g  Status:  Discontinued     3.375 g 100 mL/hr over 30 Minutes Intravenous  Once 09/24/16 0756 09/24/16 0930      Assessment/Plan: 65 y/o F with divertiulitis Active Problems:   Diverticulitis of colon with  perforation  1. Cont abx 2. Mobilize 3. Will adv diet to soft 4. Pt is hoping to go home Monday   LOS: 1 day    Rosario Jacks., Anne Hahn 09/26/2016

## 2016-09-27 MED ORDER — FLUCONAZOLE 100 MG PO TABS
ORAL_TABLET | ORAL | 0 refills | Status: DC
Start: 2016-09-27 — End: 2016-09-27

## 2016-09-27 MED ORDER — AMOXICILLIN-POT CLAVULANATE 875-125 MG PO TABS
1.0000 | ORAL_TABLET | Freq: Two times a day (BID) | ORAL | 0 refills | Status: DC
Start: 2016-09-27 — End: 2016-09-27

## 2016-09-27 MED ORDER — AMOXICILLIN-POT CLAVULANATE 875-125 MG PO TABS: 1.0000 | ORAL_TABLET | Freq: Two times a day (BID) | ORAL | 0 refills | Status: AC

## 2016-09-27 MED ORDER — FLUCONAZOLE 100 MG PO TABS: ORAL_TABLET | ORAL | 0 refills | Status: DC

## 2016-09-27 NOTE — Discharge Instructions (Signed)
Diverticulitis °Diverticulitis is inflammation or infection of small pouches in your colon that form when you have a condition called diverticulosis. The pouches in your colon are called diverticula. Your colon, or large intestine, is where water is absorbed and stool is formed. °Complications of diverticulitis can include: °· Bleeding. °· Severe infection. °· Severe pain. °· Perforation of your colon. °· Obstruction of your colon. °CAUSES  °Diverticulitis is caused by bacteria. °Diverticulitis happens when stool becomes trapped in diverticula. This allows bacteria to grow in the diverticula, which can lead to inflammation and infection. °RISK FACTORS °People with diverticulosis are at risk for diverticulitis. Eating a diet that does not include enough fiber from fruits and vegetables may make diverticulitis more likely to develop. °SYMPTOMS  °Symptoms of diverticulitis may include: °· Abdominal pain and tenderness. The pain is normally located on the left side of the abdomen, but may occur in other areas. °· Fever and chills. °· Bloating. °· Cramping. °· Nausea. °· Vomiting. °· Constipation. °· Diarrhea. °· Blood in your stool. °DIAGNOSIS  °Your health care provider will ask you about your medical history and do a physical exam. You may need to have tests done because many medical conditions can cause the same symptoms as diverticulitis. Tests may include: °· Blood tests. °· Urine tests. °· Imaging tests of the abdomen, including X-rays and CT scans. °When your condition is under control, your health care provider may recommend that you have a colonoscopy. A colonoscopy can show how severe your diverticula are and whether something else is causing your symptoms. °TREATMENT  °Most cases of diverticulitis are mild and can be treated at home. Treatment may include: °· Taking over-the-counter pain medicines. °· Following a clear liquid diet. °· Taking antibiotic medicines by mouth for 7-10 days. °More severe cases may  be treated at a hospital. Treatment may include: °· Not eating or drinking. °· Taking prescription pain medicine. °· Receiving antibiotic medicines through an IV tube. °· Receiving fluids and nutrition through an IV tube. °· Surgery. °HOME CARE INSTRUCTIONS  °· Follow your health care provider's instructions carefully. °· Follow a full liquid diet or other diet as directed by your health care provider. After your symptoms improve, your health care provider may tell you to change your diet. He or she may recommend you eat a high-fiber diet. Fruits and vegetables are good sources of fiber. Fiber makes it easier to pass stool. °· Take fiber supplements or probiotics as directed by your health care provider. °· Only take medicines as directed by your health care provider. °· Keep all your follow-up appointments. °SEEK MEDICAL CARE IF:  °· Your pain does not improve. °· You have a hard time eating food. °· Your bowel movements do not return to normal. °SEEK IMMEDIATE MEDICAL CARE IF:  °· Your pain becomes worse. °· Your symptoms do not get better. °· Your symptoms suddenly get worse. °· You have a fever. °· You have repeated vomiting. °· You have bloody or black, tarry stools. °MAKE SURE YOU:  °· Understand these instructions. °· Will watch your condition. °· Will get help right away if you are not doing well or get worse. °  °This information is not intended to replace advice given to you by your health care provider. Make sure you discuss any questions you have with your health care provider. °  °Document Released: 09/08/2005 Document Revised: 12/04/2013 Document Reviewed: 10/24/2013 °Elsevier Interactive Patient Education ©2016 [employer] ° °

## 2016-09-27 NOTE — Progress Notes (Signed)
D/C papers gone over with pt. Prescriptions given to pt. No questions/complaints. IV taken out. Pt. D/c'd successfully via w/c.

## 2016-09-27 NOTE — Progress Notes (Signed)
Patient ID: Lynn Johns, female   DOB: Aug 03, 1951, 65 y.o.   MRN: [id number]  [facility] Progress Note     Subjective: Feeling well this morning. Denies abdominal pain, nausea, vomiting. Afebrile. Having regular BMs. States that she is going home today no matter what.  Objective: Vital signs in last 24 hours: Temp:  [97.9 F (36.6 C)-98.8 F (37.1 C)] 97.9 F (36.6 C) (10/16 0543) Pulse Rate:  [68-103] 82 (10/16 0854) Resp:  [16] 16 (10/16 0543) BP: (106-127)/(48-79) 119/63 (10/16 0854) SpO2:  [95 %-100 %] 95 % (10/16 0543) Last BM Date: 09/26/16  Intake/Output from previous day: 10/15 0701 - 10/16 0700 In: 2326.7 [P.O.:490; I.V.:1686.7; IV Piggyback:150] Out: -  Intake/Output this shift: No intake/output data recorded.  PE: Gen:  Alert, NAD, pleasant Abd: Soft, NT/ND  Lab Results:   Recent Labs  09/25/16 0742 09/26/16 0516  WBC 17.0* 17.1*  HGB 13.5 13.4  HCT 40.9 40.5  PLT 285 305   BMET  Recent Labs  09/25/16 0742  NA 137  K 5.0  CL 108  CO2 23  GLUCOSE 97  BUN 13  CREATININE 1.10*  CALCIUM 8.7*   PT/INR No results for input(s): LABPROT, INR in the last 72 hours. CMP     Component Value Date/Time   NA 137 09/25/2016 0742   K 5.0 09/25/2016 0742   CL 108 09/25/2016 0742   CO2 23 09/25/2016 0742   GLUCOSE 97 09/25/2016 0742   BUN 13 09/25/2016 0742   CREATININE 1.10 (H) 09/25/2016 0742   CALCIUM 8.7 (L) 09/25/2016 0742   PROT 6.9 09/24/2016 0251   ALBUMIN 4.2 09/24/2016 0251   AST 28 09/24/2016 0251   ALT 33 09/24/2016 0251   ALKPHOS 80 09/24/2016 0251   BILITOT 1.0 09/24/2016 0251   GFRNONAA 52 (L) 09/25/2016 0742   GFRAA 60 (L) 09/25/2016 0742   Lipase     Component Value Date/Time   LIPASE 23 09/24/2016 0251       Studies/Results: No results found.  Anti-infectives: Anti-infectives    Start     Dose/Rate Route Frequency Ordered Stop   09/24/16 1445  fluconazole (DIFLUCAN) IVPB 200 mg  Status:  Discontinued      200 mg 100 mL/hr over 60 Minutes Intravenous  Once 09/24/16 1432 09/26/16 1514   09/24/16 1200  piperacillin-tazobactam (ZOSYN) IVPB 3.375 g     3.375 g 12.5 mL/hr over 240 Minutes Intravenous Every 8 hours 09/24/16 1114     09/24/16 0945  cefTRIAXone (ROCEPHIN) 2 g in dextrose 5 % 50 mL IVPB  Status:  Discontinued     2 g 100 mL/hr over 30 Minutes Intravenous Every 24 hours 09/24/16 0930 09/24/16 1114   09/24/16 0945  metroNIDAZOLE (FLAGYL) IVPB 500 mg  Status:  Discontinued     500 mg 100 mL/hr over 60 Minutes Intravenous Every 6 hours 09/24/16 0930 09/24/16 1114   09/24/16 0800  piperacillin-tazobactam (ZOSYN) IVPB 3.375 g  Status:  Discontinued     3.375 g 100 mL/hr over 30 Minutes Intravenous  Once 09/24/16 0756 09/24/16 0930       Assessment/Plan Diverticulitis of colon with perforation - no abdominal pain, nausea, vomiting - afebrile - has been on zosyn x4 day  ID - zosyn 10/13>> FEN - soft VTE - SCDs, lovenox  Plan - discussed with patient that I would like to see how she does on PO augmentin prior to discharge but she is adamant that she is going to leave  today. PCN listed on allergy list but patient states she has tolerated augmentin in the past. I will discharge her with 2 weeks of augmentin, and recommend close follow-up with PCP in 1-2 weeks. Patient states that she will discuss GI referral with PCP and set up outpatient colonoscopy in about 6 weeks.   LOS: 2 days    Jerrye Beavers , [facility] 09/27/2016, 9:10 AM Pager: [phone] Consults: [phone] Mon-Fri 7:00 am-4:30 pm Sat-Sun 7:00 am-11:30 am  Agree with above.  Alphonsa Overall, MD, [facility] Pager: [phone] Office phone:  [phone]

## 2016-09-29 NOTE — Discharge Summary (Signed)
[facility] Discharge Summary   Patient ID: Lynn Johns MRN: [id number] DOB/AGE: December 09, 1951 65 y.o.  Admit date: 09/24/2016 Discharge date: 09/29/2016  Admitting Diagnosis: RLQ abdominal pain  Discharge Diagnosis Patient Active Problem List   Diagnosis Date Noted  . Diverticulitis of colon with perforation 09/24/2016  . Degeneration of intervertebral disc, site unspecified 04/18/2012    Consultants None  Imaging: CT abdomen pelvis wo contrast 09/24/16: Perforated sigmoid diverticulitis with small pneumoperitoneum and non loculated ascites.  Procedures None  Hospital Course:  Chelsey Raycraft is a 65yo female PMH of CKDII, HTN, diverticulosis and back pain who presented to [facility] 09/24/16 with 1 day of sharp umbilical pain with radiation to RLQ.  Workup included CT which showed perforated diverticulitis with small pneumoperitoneum.  Patient was admitted for bowel rest and IV antibiotics.  Pain improved and diet was advanced as tolerated.  On 09/27/16 patient stated that she was going home no matter what. She was tolerating diet, ambulating well, pain well controlled, vital signs stable, and felt stable for discharge home.  Patient was transitioned to PO augmentin and discharged with 2 weeks of this medication. She will follow-up with her PCP in 1-2 weeks and set up outpatient colonoscopy in about 6 weeks.  Physical Exam: Gen:  Alert, NAD, pleasant Abd: Soft, NT/ND    Medication List    STOP taking these medications   celecoxib 200 MG capsule Commonly known as:  CELEBREX     TAKE these medications   amoxicillin-clavulanate 875-125 MG tablet Commonly known as:  AUGMENTIN Take 1 tablet by mouth 2 (two) times daily.   carvedilol 3.125 MG tablet Commonly known as:  COREG Take 3.125 mg by mouth 2 (two) times daily with a meal.   fexofenadine 180 MG tablet Commonly known as:  ALLEGRA Take 180 mg by mouth daily.   fluconazole 100 MG tablet Commonly known  as:  DIFLUCAN Take 1 tablet once for vaginal yeast infection.   HYDROcodone-acetaminophen 5-325 MG tablet Commonly known as:  NORCO Take 1 tablet by mouth every 6 (six) hours as needed for moderate pain.   losartan 25 MG tablet Commonly known as:  COZAAR Take 25 mg by mouth daily.   Magnesium 500 MG Caps Take 500 mg by mouth daily.   methocarbamol 500 MG tablet Commonly known as:  ROBAXIN Take 1 tablet (500 mg total) by mouth 3 (three) times daily as needed. What changed:  reasons to take this   potassium chloride SA 20 MEQ tablet Commonly known as:  K-DUR,KLOR-CON Take 20 mEq by mouth daily.   spironolactone 50 MG tablet Commonly known as:  ALDACTONE Take 50 mg by mouth daily.   traMADol 50 MG tablet Commonly known as:  ULTRAM Take 50 mg by mouth every 6 (six) hours as needed for moderate pain.   VAGIFEM 10 MCG Tabs vaginal tablet Generic drug:  Estradiol Place 1 tablet vaginally 2 (two) times a week.        Follow-up Information    WEBB, CAROL D, MD. Call today.   Specialty:  Family Medicine Why:  1-2 weeks. Recommend setting up for a colonoscopy in about 6 weeks. Contact information: [place] [address] [phone]           Signed: Jerrye Beavers, [facility] 09/29/2016, 2:44 PM Pager: [phone] Consults: [phone] Mon-Fri 7:00 am-4:30 pm Sat-Sun 7:00 am-11:30 am  Agree with above.  Alphonsa Overall, MD, [facility] Pager: [phone] Office  phone:  [phone]

## 2016-10-04 DIAGNOSIS — I10 Essential (primary) hypertension: Secondary | ICD-10-CM | POA: Diagnosis not present

## 2016-10-04 DIAGNOSIS — E876 Hypokalemia: Secondary | ICD-10-CM | POA: Diagnosis not present

## 2016-10-04 DIAGNOSIS — K5720 Diverticulitis of large intestine with perforation and abscess without bleeding: Secondary | ICD-10-CM | POA: Diagnosis not present

## 2016-10-11 DIAGNOSIS — D72829 Elevated white blood cell count, unspecified: Secondary | ICD-10-CM | POA: Diagnosis not present

## 2016-10-21 DIAGNOSIS — R933 Abnormal findings on diagnostic imaging of other parts of digestive tract: Secondary | ICD-10-CM | POA: Diagnosis not present

## 2016-10-21 DIAGNOSIS — Z1211 Encounter for screening for malignant neoplasm of colon: Secondary | ICD-10-CM | POA: Diagnosis not present

## 2016-10-21 DIAGNOSIS — K5730 Diverticulosis of large intestine without perforation or abscess without bleeding: Secondary | ICD-10-CM | POA: Diagnosis not present

## 2016-10-21 DIAGNOSIS — Z86010 Personal history of colonic polyps: Secondary | ICD-10-CM | POA: Diagnosis not present

## 2016-10-28 DIAGNOSIS — H905 Unspecified sensorineural hearing loss: Secondary | ICD-10-CM | POA: Diagnosis not present

## 2016-10-28 DIAGNOSIS — H60399 Other infective otitis externa, unspecified ear: Secondary | ICD-10-CM | POA: Diagnosis not present

## 2016-10-28 DIAGNOSIS — H8102 Meniere's disease, left ear: Secondary | ICD-10-CM | POA: Diagnosis not present

## 2016-10-28 DIAGNOSIS — H7201 Central perforation of tympanic membrane, right ear: Secondary | ICD-10-CM | POA: Diagnosis not present

## 2016-10-29 DIAGNOSIS — Z0000 Encounter for general adult medical examination without abnormal findings: Secondary | ICD-10-CM | POA: Diagnosis not present

## 2016-10-29 DIAGNOSIS — I10 Essential (primary) hypertension: Secondary | ICD-10-CM | POA: Diagnosis not present

## 2016-11-11 DIAGNOSIS — Z4789 Encounter for other orthopedic aftercare: Secondary | ICD-10-CM | POA: Diagnosis not present

## 2016-11-11 DIAGNOSIS — G8929 Other chronic pain: Secondary | ICD-10-CM | POA: Diagnosis not present

## 2016-11-11 DIAGNOSIS — M25511 Pain in right shoulder: Secondary | ICD-10-CM | POA: Diagnosis not present

## 2016-11-25 DIAGNOSIS — Z86010 Personal history of colonic polyps: Secondary | ICD-10-CM | POA: Diagnosis not present

## 2016-11-25 DIAGNOSIS — K5720 Diverticulitis of large intestine with perforation and abscess without bleeding: Secondary | ICD-10-CM | POA: Diagnosis not present

## 2016-12-16 ENCOUNTER — Other Ambulatory Visit: Payer: Self-pay | Admitting: Family Medicine

## 2016-12-16 DIAGNOSIS — Z1231 Encounter for screening mammogram for malignant neoplasm of breast: Secondary | ICD-10-CM

## 2017-01-20 DIAGNOSIS — H905 Unspecified sensorineural hearing loss: Secondary | ICD-10-CM | POA: Diagnosis not present

## 2017-01-20 DIAGNOSIS — H60399 Other infective otitis externa, unspecified ear: Secondary | ICD-10-CM | POA: Diagnosis not present

## 2017-01-20 DIAGNOSIS — H8102 Meniere's disease, left ear: Secondary | ICD-10-CM | POA: Diagnosis not present

## 2017-01-20 DIAGNOSIS — H7201 Central perforation of tympanic membrane, right ear: Secondary | ICD-10-CM | POA: Diagnosis not present

## 2017-01-28 DIAGNOSIS — D126 Benign neoplasm of colon, unspecified: Secondary | ICD-10-CM | POA: Diagnosis not present

## 2017-01-28 DIAGNOSIS — K5732 Diverticulitis of large intestine without perforation or abscess without bleeding: Secondary | ICD-10-CM | POA: Diagnosis not present

## 2017-01-28 DIAGNOSIS — D123 Benign neoplasm of transverse colon: Secondary | ICD-10-CM | POA: Diagnosis not present

## 2017-01-28 DIAGNOSIS — K635 Polyp of colon: Secondary | ICD-10-CM | POA: Diagnosis not present

## 2017-02-01 DIAGNOSIS — K635 Polyp of colon: Secondary | ICD-10-CM | POA: Diagnosis not present

## 2017-02-01 DIAGNOSIS — D126 Benign neoplasm of colon, unspecified: Secondary | ICD-10-CM | POA: Diagnosis not present

## 2017-02-10 ENCOUNTER — Ambulatory Visit
Admission: RE | Admit: 2017-02-10 | Discharge: 2017-02-10 | Disposition: A | Discharge status: Home / Self Care | Payer: BLUE CROSS/BLUE SHIELD | Source: Ambulatory Visit | Attending: Family Medicine | Admitting: Family Medicine

## 2017-02-10 DIAGNOSIS — Z1231 Encounter for screening mammogram for malignant neoplasm of breast: Secondary | ICD-10-CM

## 2017-03-21 DIAGNOSIS — J0111 Acute recurrent frontal sinusitis: Secondary | ICD-10-CM | POA: Diagnosis not present

## 2017-03-21 DIAGNOSIS — R05 Cough: Secondary | ICD-10-CM | POA: Diagnosis not present

## 2017-04-06 DIAGNOSIS — Z6823 Body mass index (BMI) 23.0-23.9, adult: Secondary | ICD-10-CM | POA: Diagnosis not present

## 2017-04-06 DIAGNOSIS — Z01419 Encounter for gynecological examination (general) (routine) without abnormal findings: Secondary | ICD-10-CM | POA: Diagnosis not present

## 2017-04-20 DIAGNOSIS — H905 Unspecified sensorineural hearing loss: Secondary | ICD-10-CM | POA: Diagnosis not present

## 2017-04-20 DIAGNOSIS — H8102 Meniere's disease, left ear: Secondary | ICD-10-CM | POA: Diagnosis not present

## 2017-04-20 DIAGNOSIS — H60399 Other infective otitis externa, unspecified ear: Secondary | ICD-10-CM | POA: Diagnosis not present

## 2017-04-20 DIAGNOSIS — H7201 Central perforation of tympanic membrane, right ear: Secondary | ICD-10-CM | POA: Diagnosis not present

## 2017-04-26 DIAGNOSIS — M25511 Pain in right shoulder: Secondary | ICD-10-CM | POA: Diagnosis not present

## 2017-04-26 DIAGNOSIS — M170 Bilateral primary osteoarthritis of knee: Secondary | ICD-10-CM | POA: Diagnosis not present

## 2017-04-26 DIAGNOSIS — G8929 Other chronic pain: Secondary | ICD-10-CM | POA: Diagnosis not present

## 2017-04-26 DIAGNOSIS — Z4789 Encounter for other orthopedic aftercare: Secondary | ICD-10-CM | POA: Diagnosis not present

## 2017-05-13 DIAGNOSIS — H52203 Unspecified astigmatism, bilateral: Secondary | ICD-10-CM | POA: Diagnosis not present

## 2017-05-13 DIAGNOSIS — H5010 Unspecified exotropia: Secondary | ICD-10-CM | POA: Diagnosis not present

## 2017-05-24 DIAGNOSIS — I10 Essential (primary) hypertension: Secondary | ICD-10-CM | POA: Diagnosis not present

## 2017-06-06 DIAGNOSIS — M150 Primary generalized (osteo)arthritis: Secondary | ICD-10-CM | POA: Diagnosis not present

## 2017-06-06 DIAGNOSIS — M5030 Other cervical disc degeneration, unspecified cervical region: Secondary | ICD-10-CM | POA: Diagnosis not present

## 2017-06-06 DIAGNOSIS — M2550 Pain in unspecified joint: Secondary | ICD-10-CM | POA: Diagnosis not present

## 2017-06-06 DIAGNOSIS — M79642 Pain in left hand: Secondary | ICD-10-CM | POA: Diagnosis not present

## 2017-06-06 DIAGNOSIS — M8580 Other specified disorders of bone density and structure, unspecified site: Secondary | ICD-10-CM | POA: Diagnosis not present

## 2017-06-06 DIAGNOSIS — M5136 Other intervertebral disc degeneration, lumbar region: Secondary | ICD-10-CM | POA: Diagnosis not present

## 2017-06-06 DIAGNOSIS — M79641 Pain in right hand: Secondary | ICD-10-CM | POA: Diagnosis not present

## 2017-06-07 DIAGNOSIS — M170 Bilateral primary osteoarthritis of knee: Secondary | ICD-10-CM | POA: Diagnosis not present

## 2017-06-08 DIAGNOSIS — L989 Disorder of the skin and subcutaneous tissue, unspecified: Secondary | ICD-10-CM | POA: Diagnosis not present

## 2017-06-10 IMAGING — MR MR SHOULDER*L* W/O CM
5 series · 33 of 40 positions shown · non-contrast
Comparison: None.

CLINICAL DATA: Persistent shoulder pain since shoulder surgery in
[REDACTED] (rotator cuff repair and biceps tendon relocation per patient).

EXAM:
MRI OF THE LEFT SHOULDER WITHOUT CONTRAST
TECHNIQUE: Multiplanar, multisequence MR imaging of the shoulder was performed.
No intravenous contrast was administered.

[Series 9: T2 fat-sat · axial · left · 3.0mm · 0.36mm/px · z∈[-52,+21]mm · 8 of 24 slices shown (1 of 3)]
[im 1/24]
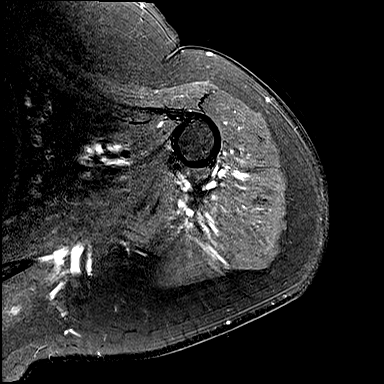
[im 4/24]
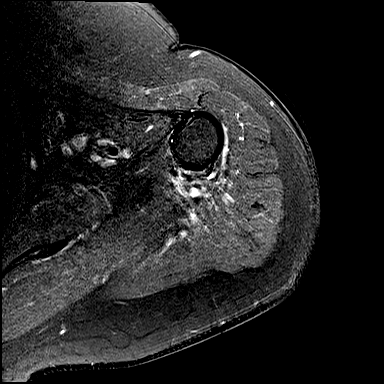
[im 7/24]
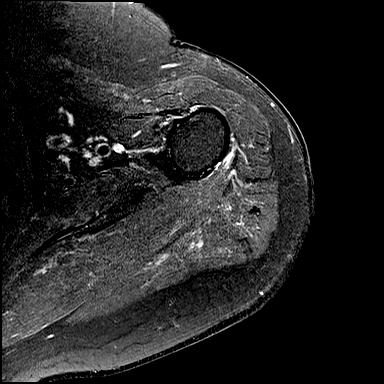
[im 10/24]
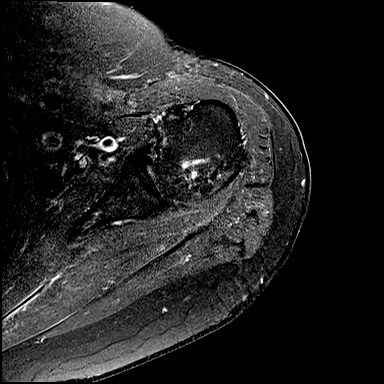
[im 14/24]
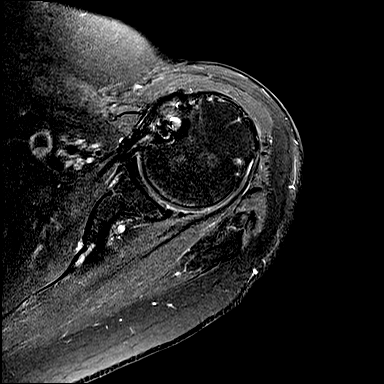
[im 17/24]
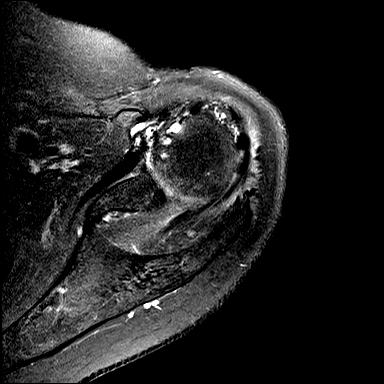
[im 20/24]
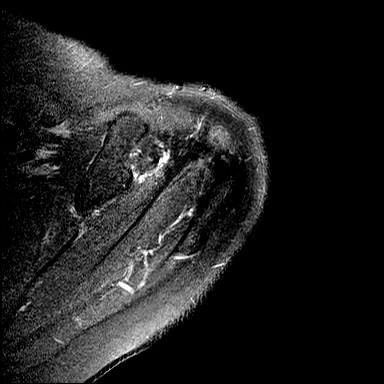
[im 24/24]
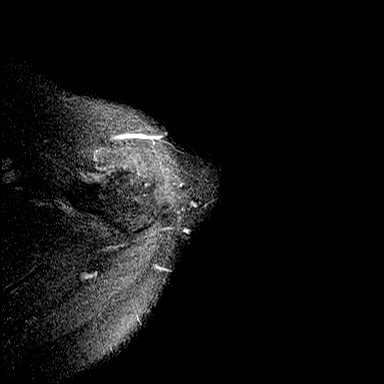

[Series 10: PD · oblique · left · 3.0mm · 0.39mm/px · 8 of 21 slices shown]
[im 1/21]
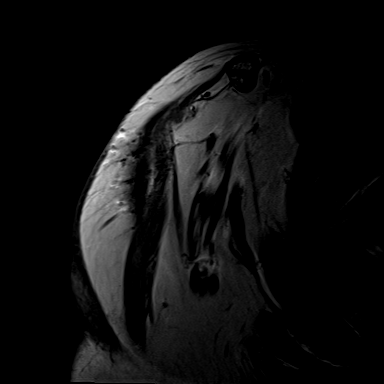
[im 3/21]
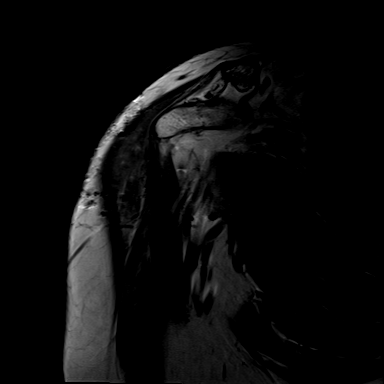
[im 6/21]
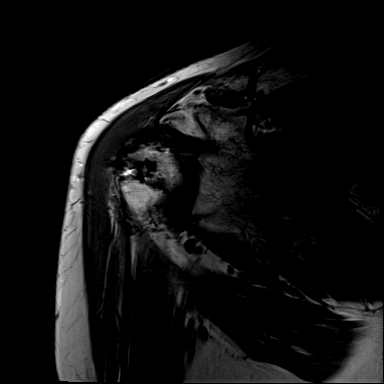
[im 9/21]
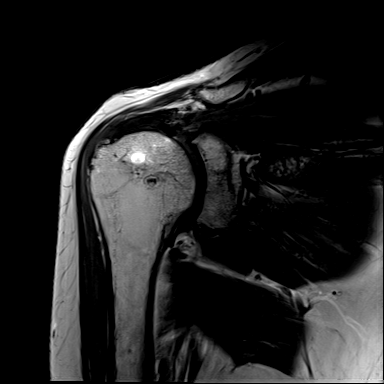
[im 12/21]
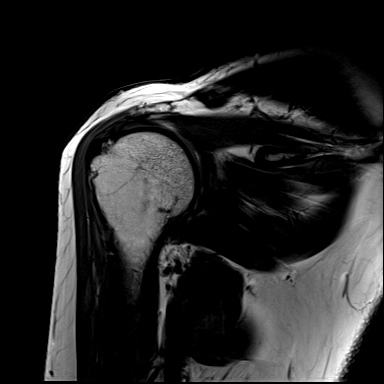
[im 15/21]
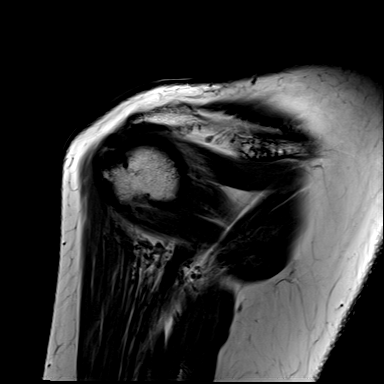
[im 18/21]
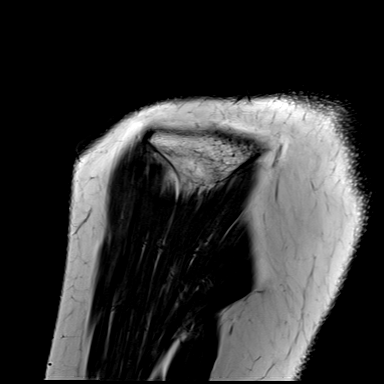
[im 21/21]
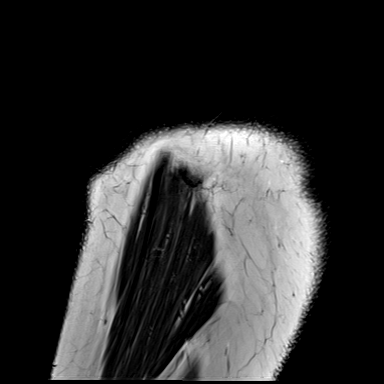

[Series 11: T2 fat-sat · oblique · left · 3.0mm · 0.39mm/px · 8 of 21 slices shown (2 of 3)]
[im 1/21]
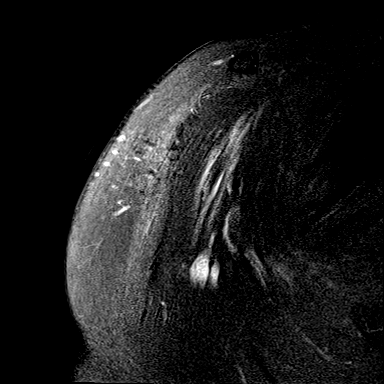
[im 3/21]
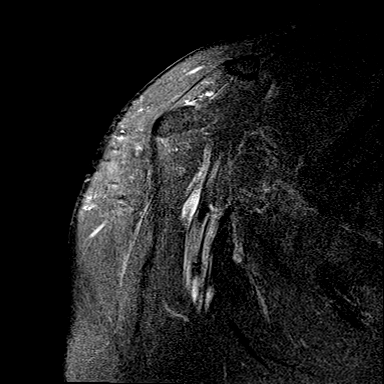
[im 6/21]
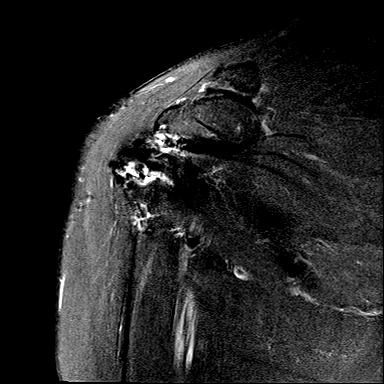
[im 9/21]
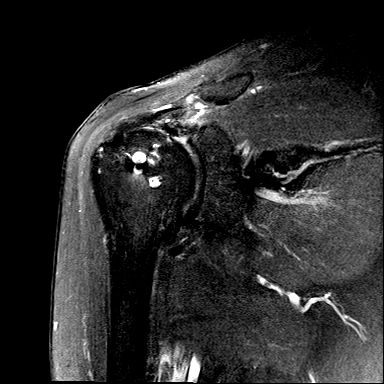
[im 12/21]
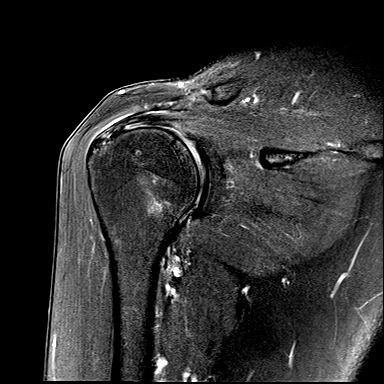
[im 15/21]
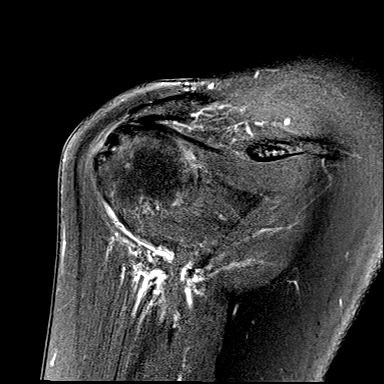
[im 18/21]
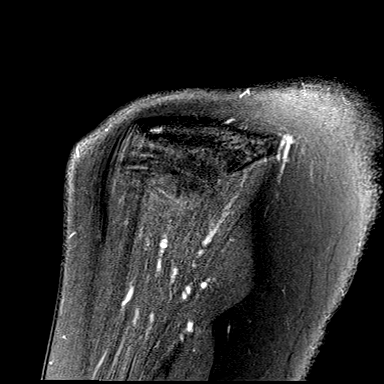
[im 21/21]
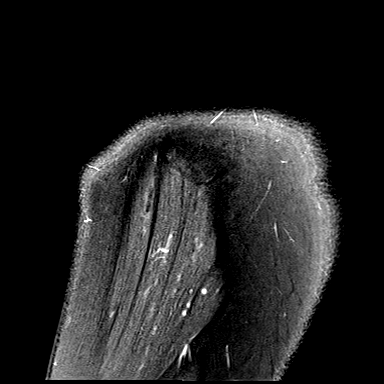

[Series 12: T1 · oblique · left · 3.0mm · 0.33mm/px · 1 of 22 slices shown]
[im 1/22]
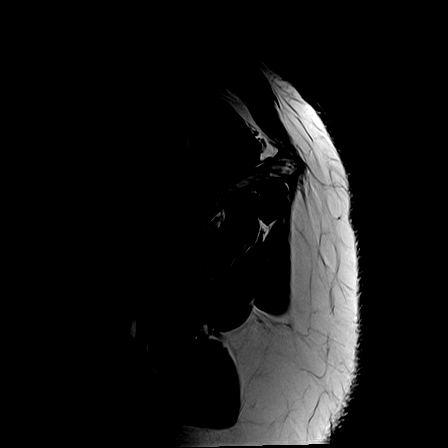

[Series 13: T2 fat-sat · oblique · left · 3.0mm · 0.39mm/px · 8 of 22 slices shown (3 of 3)]
[im 1/22]
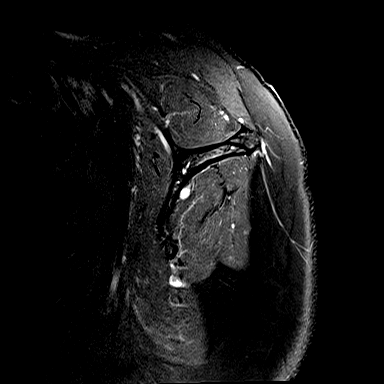
[im 4/22]
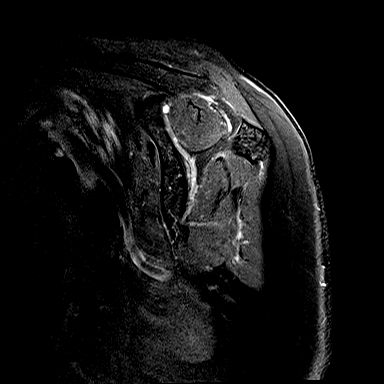
[im 7/22]
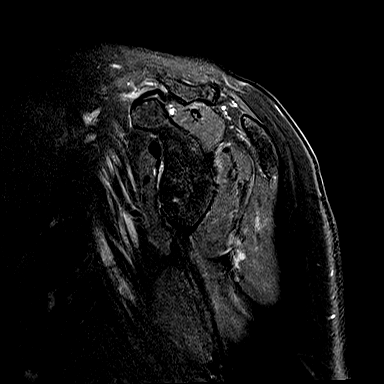
[im 10/22]
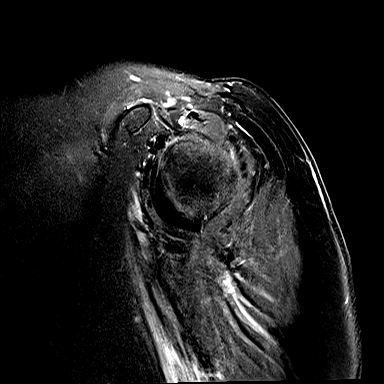
[im 13/22]
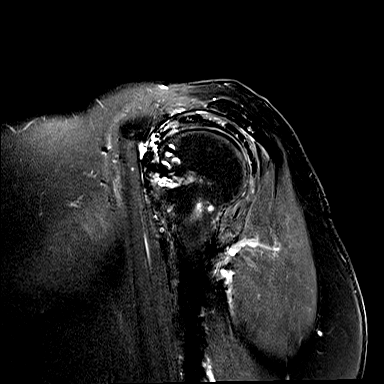
[im 16/22]
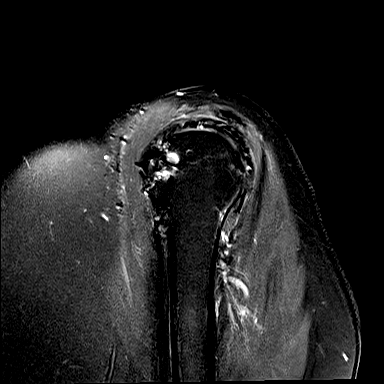
[im 19/22]
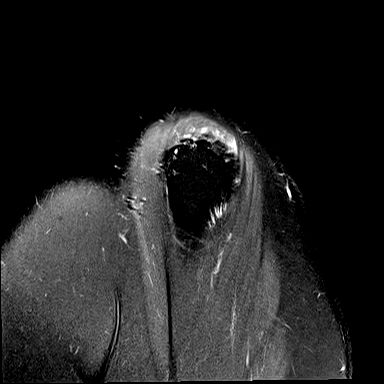
[im 22/22]
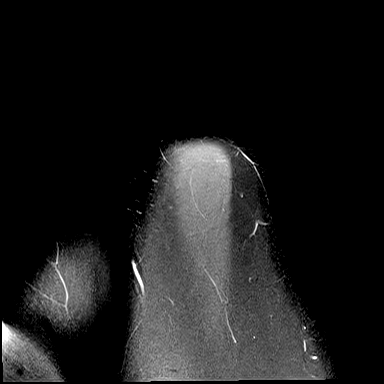

[33 of 40 positions shown; findings below may reference images not displayed]

FINDINGS: Rotator cuff: There are postsurgical changes within the
subscapularis tendon and anterior humeral head. The subscapularis
tendon appears intact. There is mild supraspinatus and infraspinatus
tendinosis without evidence of focal tear. The teres minor tendon
appears normal.

Muscles:  No focal muscular atrophy or edema.

Biceps long head: The intra-articular portion of the biceps tendon
is not clearly visualized. Patient appears to be status post biceps
tenodesis. The tendon appears intact and normally positioned within
the bicipital groove.

Acromioclavicular Joint: The acromion is type 1. Patient appears to
be status post distal clavicle resection. No definite postsurgical
changes are seen at the acromion. There is no osseous encroachment
on the rotator cuff or significant fluid in the subacromial
-subdeltoid space.

Glenohumeral Joint: No significant shoulder joint effusion or
glenohumeral arthropathy.

Labrum: Labral assessment is limited by the lack of joint fluid.
Patient appears to be status post debridement of the superior
labrum. No recurrent labral tear or paralabral cyst identified.

Bones: No acute or significant extra-articular osseous findings.
IMPRESSION: 1. Postsurgical changes suggesting previous distal clavicle
resection, repair of the subscapularis tendon, biceps tenodesis and
superior labral debridement.
2. No evidence of recurrent rotator cuff tear. There is mild rotator
cuff tendinosis as described.
3. No acute findings demonstrated.

## 2017-06-14 DIAGNOSIS — M170 Bilateral primary osteoarthritis of knee: Secondary | ICD-10-CM | POA: Diagnosis not present

## 2017-06-21 DIAGNOSIS — M170 Bilateral primary osteoarthritis of knee: Secondary | ICD-10-CM | POA: Diagnosis not present

## 2017-08-02 DIAGNOSIS — M170 Bilateral primary osteoarthritis of knee: Secondary | ICD-10-CM | POA: Diagnosis not present

## 2017-08-03 DIAGNOSIS — H60399 Other infective otitis externa, unspecified ear: Secondary | ICD-10-CM | POA: Diagnosis not present

## 2017-08-03 DIAGNOSIS — H8102 Meniere's disease, left ear: Secondary | ICD-10-CM | POA: Diagnosis not present

## 2017-08-03 DIAGNOSIS — H905 Unspecified sensorineural hearing loss: Secondary | ICD-10-CM | POA: Diagnosis not present

## 2017-08-03 DIAGNOSIS — H7201 Central perforation of tympanic membrane, right ear: Secondary | ICD-10-CM | POA: Diagnosis not present

## 2017-08-09 DIAGNOSIS — Z23 Encounter for immunization: Secondary | ICD-10-CM | POA: Diagnosis not present

## 2017-08-19 DIAGNOSIS — J309 Allergic rhinitis, unspecified: Secondary | ICD-10-CM | POA: Diagnosis not present

## 2017-08-25 DIAGNOSIS — M1712 Unilateral primary osteoarthritis, left knee: Secondary | ICD-10-CM | POA: Diagnosis not present

## 2017-09-29 DIAGNOSIS — G8929 Other chronic pain: Secondary | ICD-10-CM | POA: Diagnosis not present

## 2017-09-29 DIAGNOSIS — M170 Bilateral primary osteoarthritis of knee: Secondary | ICD-10-CM | POA: Diagnosis not present

## 2017-09-29 DIAGNOSIS — M25511 Pain in right shoulder: Secondary | ICD-10-CM | POA: Diagnosis not present

## 2017-10-03 DIAGNOSIS — J0101 Acute recurrent maxillary sinusitis: Secondary | ICD-10-CM | POA: Diagnosis not present

## 2017-10-03 DIAGNOSIS — I10 Essential (primary) hypertension: Secondary | ICD-10-CM | POA: Diagnosis not present

## 2017-10-03 DIAGNOSIS — R05 Cough: Secondary | ICD-10-CM | POA: Diagnosis not present

## 2017-10-11 DIAGNOSIS — N183 Chronic kidney disease, stage 3 (moderate): Secondary | ICD-10-CM | POA: Diagnosis not present

## 2017-10-31 DIAGNOSIS — H60399 Other infective otitis externa, unspecified ear: Secondary | ICD-10-CM | POA: Diagnosis not present

## 2017-10-31 DIAGNOSIS — H7201 Central perforation of tympanic membrane, right ear: Secondary | ICD-10-CM | POA: Diagnosis not present

## 2017-10-31 DIAGNOSIS — H8102 Meniere's disease, left ear: Secondary | ICD-10-CM | POA: Diagnosis not present

## 2017-10-31 DIAGNOSIS — H905 Unspecified sensorineural hearing loss: Secondary | ICD-10-CM | POA: Diagnosis not present

## 2017-11-07 DIAGNOSIS — Z0000 Encounter for general adult medical examination without abnormal findings: Secondary | ICD-10-CM | POA: Diagnosis not present

## 2017-11-07 DIAGNOSIS — I10 Essential (primary) hypertension: Secondary | ICD-10-CM | POA: Diagnosis not present

## 2017-11-07 DIAGNOSIS — Z23 Encounter for immunization: Secondary | ICD-10-CM | POA: Diagnosis not present

## 2017-11-08 DIAGNOSIS — M6281 Muscle weakness (generalized): Secondary | ICD-10-CM | POA: Diagnosis not present

## 2017-11-08 DIAGNOSIS — M25661 Stiffness of right knee, not elsewhere classified: Secondary | ICD-10-CM | POA: Diagnosis not present

## 2017-11-08 DIAGNOSIS — R262 Difficulty in walking, not elsewhere classified: Secondary | ICD-10-CM | POA: Diagnosis not present

## 2017-11-08 DIAGNOSIS — M25561 Pain in right knee: Secondary | ICD-10-CM | POA: Diagnosis not present

## 2017-11-10 DIAGNOSIS — R262 Difficulty in walking, not elsewhere classified: Secondary | ICD-10-CM | POA: Diagnosis not present

## 2017-11-10 DIAGNOSIS — M6281 Muscle weakness (generalized): Secondary | ICD-10-CM | POA: Diagnosis not present

## 2017-11-10 DIAGNOSIS — M25661 Stiffness of right knee, not elsewhere classified: Secondary | ICD-10-CM | POA: Diagnosis not present

## 2017-11-10 DIAGNOSIS — M25561 Pain in right knee: Secondary | ICD-10-CM | POA: Diagnosis not present

## 2017-11-15 DIAGNOSIS — M6281 Muscle weakness (generalized): Secondary | ICD-10-CM | POA: Diagnosis not present

## 2017-11-15 DIAGNOSIS — M25561 Pain in right knee: Secondary | ICD-10-CM | POA: Diagnosis not present

## 2017-11-15 DIAGNOSIS — M25661 Stiffness of right knee, not elsewhere classified: Secondary | ICD-10-CM | POA: Diagnosis not present

## 2017-11-15 DIAGNOSIS — R262 Difficulty in walking, not elsewhere classified: Secondary | ICD-10-CM | POA: Diagnosis not present

## 2017-11-17 DIAGNOSIS — M170 Bilateral primary osteoarthritis of knee: Secondary | ICD-10-CM | POA: Diagnosis not present

## 2017-11-17 DIAGNOSIS — G8929 Other chronic pain: Secondary | ICD-10-CM | POA: Diagnosis not present

## 2017-11-17 DIAGNOSIS — M25511 Pain in right shoulder: Secondary | ICD-10-CM | POA: Diagnosis not present

## 2017-11-18 DIAGNOSIS — M25661 Stiffness of right knee, not elsewhere classified: Secondary | ICD-10-CM | POA: Diagnosis not present

## 2017-11-18 DIAGNOSIS — R262 Difficulty in walking, not elsewhere classified: Secondary | ICD-10-CM | POA: Diagnosis not present

## 2017-11-18 DIAGNOSIS — M25561 Pain in right knee: Secondary | ICD-10-CM | POA: Diagnosis not present

## 2017-11-18 DIAGNOSIS — M6281 Muscle weakness (generalized): Secondary | ICD-10-CM | POA: Diagnosis not present

## 2017-11-23 DIAGNOSIS — M25661 Stiffness of right knee, not elsewhere classified: Secondary | ICD-10-CM | POA: Diagnosis not present

## 2017-11-23 DIAGNOSIS — R262 Difficulty in walking, not elsewhere classified: Secondary | ICD-10-CM | POA: Diagnosis not present

## 2017-11-23 DIAGNOSIS — M25561 Pain in right knee: Secondary | ICD-10-CM | POA: Diagnosis not present

## 2017-11-23 DIAGNOSIS — M6281 Muscle weakness (generalized): Secondary | ICD-10-CM | POA: Diagnosis not present

## 2017-11-25 DIAGNOSIS — M25661 Stiffness of right knee, not elsewhere classified: Secondary | ICD-10-CM | POA: Diagnosis not present

## 2017-11-25 DIAGNOSIS — M6281 Muscle weakness (generalized): Secondary | ICD-10-CM | POA: Diagnosis not present

## 2017-11-25 DIAGNOSIS — M25561 Pain in right knee: Secondary | ICD-10-CM | POA: Diagnosis not present

## 2017-11-25 DIAGNOSIS — R262 Difficulty in walking, not elsewhere classified: Secondary | ICD-10-CM | POA: Diagnosis not present

## 2017-11-28 DIAGNOSIS — M6281 Muscle weakness (generalized): Secondary | ICD-10-CM | POA: Diagnosis not present

## 2017-11-28 DIAGNOSIS — M25661 Stiffness of right knee, not elsewhere classified: Secondary | ICD-10-CM | POA: Diagnosis not present

## 2017-11-28 DIAGNOSIS — R262 Difficulty in walking, not elsewhere classified: Secondary | ICD-10-CM | POA: Diagnosis not present

## 2017-11-28 DIAGNOSIS — M25561 Pain in right knee: Secondary | ICD-10-CM | POA: Diagnosis not present

## 2017-11-29 DIAGNOSIS — M6281 Muscle weakness (generalized): Secondary | ICD-10-CM | POA: Diagnosis not present

## 2017-11-29 DIAGNOSIS — M25561 Pain in right knee: Secondary | ICD-10-CM | POA: Diagnosis not present

## 2017-11-29 DIAGNOSIS — M25661 Stiffness of right knee, not elsewhere classified: Secondary | ICD-10-CM | POA: Diagnosis not present

## 2017-11-29 DIAGNOSIS — R262 Difficulty in walking, not elsewhere classified: Secondary | ICD-10-CM | POA: Diagnosis not present

## 2017-12-04 DIAGNOSIS — R262 Difficulty in walking, not elsewhere classified: Secondary | ICD-10-CM | POA: Diagnosis not present

## 2017-12-04 DIAGNOSIS — M25661 Stiffness of right knee, not elsewhere classified: Secondary | ICD-10-CM | POA: Diagnosis not present

## 2017-12-04 DIAGNOSIS — M6281 Muscle weakness (generalized): Secondary | ICD-10-CM | POA: Diagnosis not present

## 2017-12-04 DIAGNOSIS — M25561 Pain in right knee: Secondary | ICD-10-CM | POA: Diagnosis not present

## 2017-12-08 DIAGNOSIS — R262 Difficulty in walking, not elsewhere classified: Secondary | ICD-10-CM | POA: Diagnosis not present

## 2017-12-08 DIAGNOSIS — M25561 Pain in right knee: Secondary | ICD-10-CM | POA: Diagnosis not present

## 2017-12-08 DIAGNOSIS — M6281 Muscle weakness (generalized): Secondary | ICD-10-CM | POA: Diagnosis not present

## 2017-12-08 DIAGNOSIS — M25661 Stiffness of right knee, not elsewhere classified: Secondary | ICD-10-CM | POA: Diagnosis not present

## 2017-12-12 DIAGNOSIS — M6281 Muscle weakness (generalized): Secondary | ICD-10-CM | POA: Diagnosis not present

## 2017-12-12 DIAGNOSIS — R262 Difficulty in walking, not elsewhere classified: Secondary | ICD-10-CM | POA: Diagnosis not present

## 2017-12-12 DIAGNOSIS — M25661 Stiffness of right knee, not elsewhere classified: Secondary | ICD-10-CM | POA: Diagnosis not present

## 2017-12-12 DIAGNOSIS — M25561 Pain in right knee: Secondary | ICD-10-CM | POA: Diagnosis not present

## 2017-12-28 DIAGNOSIS — M25512 Pain in left shoulder: Secondary | ICD-10-CM | POA: Diagnosis not present

## 2018-01-03 DIAGNOSIS — M170 Bilateral primary osteoarthritis of knee: Secondary | ICD-10-CM | POA: Diagnosis not present

## 2018-01-04 ENCOUNTER — Other Ambulatory Visit: Payer: Self-pay | Admitting: Family Medicine

## 2018-01-04 DIAGNOSIS — Z139 Encounter for screening, unspecified: Secondary | ICD-10-CM

## 2018-01-10 DIAGNOSIS — M170 Bilateral primary osteoarthritis of knee: Secondary | ICD-10-CM | POA: Diagnosis not present

## 2018-01-13 DIAGNOSIS — M1712 Unilateral primary osteoarthritis, left knee: Secondary | ICD-10-CM | POA: Diagnosis not present

## 2018-01-17 DIAGNOSIS — I10 Essential (primary) hypertension: Secondary | ICD-10-CM | POA: Diagnosis not present

## 2018-01-17 DIAGNOSIS — M170 Bilateral primary osteoarthritis of knee: Secondary | ICD-10-CM | POA: Diagnosis not present

## 2018-02-01 DIAGNOSIS — J209 Acute bronchitis, unspecified: Secondary | ICD-10-CM | POA: Diagnosis not present

## 2018-02-13 ENCOUNTER — Ambulatory Visit: Payer: BLUE CROSS/BLUE SHIELD

## 2018-02-15 ENCOUNTER — Ambulatory Visit
Admission: RE | Admit: 2018-02-15 | Discharge: 2018-02-15 | Disposition: A | Discharge status: Home / Self Care | Payer: BLUE CROSS/BLUE SHIELD | Source: Ambulatory Visit | Attending: Family Medicine | Admitting: Family Medicine

## 2018-02-15 DIAGNOSIS — Z139 Encounter for screening, unspecified: Secondary | ICD-10-CM

## 2018-02-15 DIAGNOSIS — Z1231 Encounter for screening mammogram for malignant neoplasm of breast: Secondary | ICD-10-CM | POA: Diagnosis not present

## 2018-02-21 DIAGNOSIS — H905 Unspecified sensorineural hearing loss: Secondary | ICD-10-CM | POA: Diagnosis not present

## 2018-02-21 DIAGNOSIS — H7201 Central perforation of tympanic membrane, right ear: Secondary | ICD-10-CM | POA: Diagnosis not present

## 2018-02-21 DIAGNOSIS — H8102 Meniere's disease, left ear: Secondary | ICD-10-CM | POA: Diagnosis not present

## 2018-02-21 DIAGNOSIS — H60399 Other infective otitis externa, unspecified ear: Secondary | ICD-10-CM | POA: Diagnosis not present

## 2018-02-28 DIAGNOSIS — M25561 Pain in right knee: Secondary | ICD-10-CM | POA: Diagnosis not present

## 2018-03-28 DIAGNOSIS — M25561 Pain in right knee: Secondary | ICD-10-CM | POA: Diagnosis not present

## 2018-04-10 DIAGNOSIS — Z01419 Encounter for gynecological examination (general) (routine) without abnormal findings: Secondary | ICD-10-CM | POA: Diagnosis not present

## 2018-04-10 DIAGNOSIS — Z6824 Body mass index (BMI) 24.0-24.9, adult: Secondary | ICD-10-CM | POA: Diagnosis not present

## 2018-04-18 DIAGNOSIS — M25511 Pain in right shoulder: Secondary | ICD-10-CM | POA: Diagnosis not present

## 2018-04-18 DIAGNOSIS — M25512 Pain in left shoulder: Secondary | ICD-10-CM | POA: Diagnosis not present

## 2018-05-05 DIAGNOSIS — J329 Chronic sinusitis, unspecified: Secondary | ICD-10-CM | POA: Diagnosis not present

## 2018-05-18 DIAGNOSIS — H8102 Meniere's disease, left ear: Secondary | ICD-10-CM | POA: Diagnosis not present

## 2018-05-18 DIAGNOSIS — M1712 Unilateral primary osteoarthritis, left knee: Secondary | ICD-10-CM | POA: Diagnosis not present

## 2018-05-18 DIAGNOSIS — H905 Unspecified sensorineural hearing loss: Secondary | ICD-10-CM | POA: Diagnosis not present

## 2018-05-18 DIAGNOSIS — H7203 Central perforation of tympanic membrane, bilateral: Secondary | ICD-10-CM | POA: Diagnosis not present

## 2018-05-18 DIAGNOSIS — H6983 Other specified disorders of Eustachian tube, bilateral: Secondary | ICD-10-CM | POA: Diagnosis not present

## 2018-05-23 DIAGNOSIS — M25562 Pain in left knee: Secondary | ICD-10-CM | POA: Diagnosis not present

## 2018-05-23 DIAGNOSIS — M1712 Unilateral primary osteoarthritis, left knee: Secondary | ICD-10-CM | POA: Diagnosis not present

## 2018-05-25 DIAGNOSIS — M25562 Pain in left knee: Secondary | ICD-10-CM | POA: Diagnosis not present

## 2018-05-25 DIAGNOSIS — M25662 Stiffness of left knee, not elsewhere classified: Secondary | ICD-10-CM | POA: Diagnosis not present

## 2018-05-25 DIAGNOSIS — R262 Difficulty in walking, not elsewhere classified: Secondary | ICD-10-CM | POA: Diagnosis not present

## 2018-05-25 DIAGNOSIS — M25462 Effusion, left knee: Secondary | ICD-10-CM | POA: Diagnosis not present

## 2018-05-26 DIAGNOSIS — M25562 Pain in left knee: Secondary | ICD-10-CM | POA: Diagnosis not present

## 2018-05-26 DIAGNOSIS — M25462 Effusion, left knee: Secondary | ICD-10-CM | POA: Diagnosis not present

## 2018-05-26 DIAGNOSIS — R262 Difficulty in walking, not elsewhere classified: Secondary | ICD-10-CM | POA: Diagnosis not present

## 2018-05-26 DIAGNOSIS — M25662 Stiffness of left knee, not elsewhere classified: Secondary | ICD-10-CM | POA: Diagnosis not present

## 2018-05-29 DIAGNOSIS — M25562 Pain in left knee: Secondary | ICD-10-CM | POA: Diagnosis not present

## 2018-05-29 DIAGNOSIS — M25662 Stiffness of left knee, not elsewhere classified: Secondary | ICD-10-CM | POA: Diagnosis not present

## 2018-05-29 DIAGNOSIS — M25462 Effusion, left knee: Secondary | ICD-10-CM | POA: Diagnosis not present

## 2018-05-29 DIAGNOSIS — R262 Difficulty in walking, not elsewhere classified: Secondary | ICD-10-CM | POA: Diagnosis not present

## 2018-05-31 DIAGNOSIS — M25662 Stiffness of left knee, not elsewhere classified: Secondary | ICD-10-CM | POA: Diagnosis not present

## 2018-05-31 DIAGNOSIS — R262 Difficulty in walking, not elsewhere classified: Secondary | ICD-10-CM | POA: Diagnosis not present

## 2018-05-31 DIAGNOSIS — M25462 Effusion, left knee: Secondary | ICD-10-CM | POA: Diagnosis not present

## 2018-05-31 DIAGNOSIS — M25562 Pain in left knee: Secondary | ICD-10-CM | POA: Diagnosis not present

## 2018-06-02 DIAGNOSIS — M25462 Effusion, left knee: Secondary | ICD-10-CM | POA: Diagnosis not present

## 2018-06-02 DIAGNOSIS — M25662 Stiffness of left knee, not elsewhere classified: Secondary | ICD-10-CM | POA: Diagnosis not present

## 2018-06-02 DIAGNOSIS — M25562 Pain in left knee: Secondary | ICD-10-CM | POA: Diagnosis not present

## 2018-06-02 DIAGNOSIS — R262 Difficulty in walking, not elsewhere classified: Secondary | ICD-10-CM | POA: Diagnosis not present

## 2018-06-05 DIAGNOSIS — M25562 Pain in left knee: Secondary | ICD-10-CM | POA: Diagnosis not present

## 2018-06-05 DIAGNOSIS — M25462 Effusion, left knee: Secondary | ICD-10-CM | POA: Diagnosis not present

## 2018-06-05 DIAGNOSIS — R262 Difficulty in walking, not elsewhere classified: Secondary | ICD-10-CM | POA: Diagnosis not present

## 2018-06-05 DIAGNOSIS — M25662 Stiffness of left knee, not elsewhere classified: Secondary | ICD-10-CM | POA: Diagnosis not present

## 2018-06-07 DIAGNOSIS — R262 Difficulty in walking, not elsewhere classified: Secondary | ICD-10-CM | POA: Diagnosis not present

## 2018-06-07 DIAGNOSIS — M25462 Effusion, left knee: Secondary | ICD-10-CM | POA: Diagnosis not present

## 2018-06-07 DIAGNOSIS — M25562 Pain in left knee: Secondary | ICD-10-CM | POA: Diagnosis not present

## 2018-06-07 DIAGNOSIS — M25662 Stiffness of left knee, not elsewhere classified: Secondary | ICD-10-CM | POA: Diagnosis not present

## 2018-06-09 DIAGNOSIS — M25662 Stiffness of left knee, not elsewhere classified: Secondary | ICD-10-CM | POA: Diagnosis not present

## 2018-06-09 DIAGNOSIS — R262 Difficulty in walking, not elsewhere classified: Secondary | ICD-10-CM | POA: Diagnosis not present

## 2018-06-09 DIAGNOSIS — M25562 Pain in left knee: Secondary | ICD-10-CM | POA: Diagnosis not present

## 2018-06-09 DIAGNOSIS — M25462 Effusion, left knee: Secondary | ICD-10-CM | POA: Diagnosis not present

## 2018-06-12 DIAGNOSIS — R262 Difficulty in walking, not elsewhere classified: Secondary | ICD-10-CM | POA: Diagnosis not present

## 2018-06-12 DIAGNOSIS — M25662 Stiffness of left knee, not elsewhere classified: Secondary | ICD-10-CM | POA: Diagnosis not present

## 2018-06-12 DIAGNOSIS — M25462 Effusion, left knee: Secondary | ICD-10-CM | POA: Diagnosis not present

## 2018-06-12 DIAGNOSIS — M25562 Pain in left knee: Secondary | ICD-10-CM | POA: Diagnosis not present

## 2018-06-13 DIAGNOSIS — I10 Essential (primary) hypertension: Secondary | ICD-10-CM | POA: Diagnosis not present

## 2018-06-16 DIAGNOSIS — R262 Difficulty in walking, not elsewhere classified: Secondary | ICD-10-CM | POA: Diagnosis not present

## 2018-06-16 DIAGNOSIS — M25462 Effusion, left knee: Secondary | ICD-10-CM | POA: Diagnosis not present

## 2018-06-16 DIAGNOSIS — M25562 Pain in left knee: Secondary | ICD-10-CM | POA: Diagnosis not present

## 2018-06-16 DIAGNOSIS — M25662 Stiffness of left knee, not elsewhere classified: Secondary | ICD-10-CM | POA: Diagnosis not present

## 2018-06-19 DIAGNOSIS — M25662 Stiffness of left knee, not elsewhere classified: Secondary | ICD-10-CM | POA: Diagnosis not present

## 2018-06-19 DIAGNOSIS — R262 Difficulty in walking, not elsewhere classified: Secondary | ICD-10-CM | POA: Diagnosis not present

## 2018-06-19 DIAGNOSIS — M25562 Pain in left knee: Secondary | ICD-10-CM | POA: Diagnosis not present

## 2018-06-19 DIAGNOSIS — M25462 Effusion, left knee: Secondary | ICD-10-CM | POA: Diagnosis not present

## 2018-06-21 DIAGNOSIS — M25562 Pain in left knee: Secondary | ICD-10-CM | POA: Diagnosis not present

## 2018-06-21 DIAGNOSIS — R262 Difficulty in walking, not elsewhere classified: Secondary | ICD-10-CM | POA: Diagnosis not present

## 2018-06-21 DIAGNOSIS — M25662 Stiffness of left knee, not elsewhere classified: Secondary | ICD-10-CM | POA: Diagnosis not present

## 2018-06-21 DIAGNOSIS — M25462 Effusion, left knee: Secondary | ICD-10-CM | POA: Diagnosis not present

## 2018-06-26 DIAGNOSIS — M25562 Pain in left knee: Secondary | ICD-10-CM | POA: Diagnosis not present

## 2018-06-26 DIAGNOSIS — M25662 Stiffness of left knee, not elsewhere classified: Secondary | ICD-10-CM | POA: Diagnosis not present

## 2018-06-26 DIAGNOSIS — R262 Difficulty in walking, not elsewhere classified: Secondary | ICD-10-CM | POA: Diagnosis not present

## 2018-06-26 DIAGNOSIS — M25462 Effusion, left knee: Secondary | ICD-10-CM | POA: Diagnosis not present

## 2018-06-27 DIAGNOSIS — Z471 Aftercare following joint replacement surgery: Secondary | ICD-10-CM | POA: Diagnosis not present

## 2018-06-27 DIAGNOSIS — Z96652 Presence of left artificial knee joint: Secondary | ICD-10-CM | POA: Diagnosis not present

## 2018-06-28 DIAGNOSIS — M25662 Stiffness of left knee, not elsewhere classified: Secondary | ICD-10-CM | POA: Diagnosis not present

## 2018-06-28 DIAGNOSIS — R262 Difficulty in walking, not elsewhere classified: Secondary | ICD-10-CM | POA: Diagnosis not present

## 2018-06-28 DIAGNOSIS — M25462 Effusion, left knee: Secondary | ICD-10-CM | POA: Diagnosis not present

## 2018-06-28 DIAGNOSIS — M25562 Pain in left knee: Secondary | ICD-10-CM | POA: Diagnosis not present

## 2018-07-03 DIAGNOSIS — R262 Difficulty in walking, not elsewhere classified: Secondary | ICD-10-CM | POA: Diagnosis not present

## 2018-07-03 DIAGNOSIS — M25662 Stiffness of left knee, not elsewhere classified: Secondary | ICD-10-CM | POA: Diagnosis not present

## 2018-07-03 DIAGNOSIS — M25462 Effusion, left knee: Secondary | ICD-10-CM | POA: Diagnosis not present

## 2018-07-03 DIAGNOSIS — M25562 Pain in left knee: Secondary | ICD-10-CM | POA: Diagnosis not present

## 2018-07-05 DIAGNOSIS — M25562 Pain in left knee: Secondary | ICD-10-CM | POA: Diagnosis not present

## 2018-07-05 DIAGNOSIS — M25462 Effusion, left knee: Secondary | ICD-10-CM | POA: Diagnosis not present

## 2018-07-05 DIAGNOSIS — R262 Difficulty in walking, not elsewhere classified: Secondary | ICD-10-CM | POA: Diagnosis not present

## 2018-07-05 DIAGNOSIS — M25662 Stiffness of left knee, not elsewhere classified: Secondary | ICD-10-CM | POA: Diagnosis not present

## 2018-07-17 DIAGNOSIS — M25562 Pain in left knee: Secondary | ICD-10-CM | POA: Diagnosis not present

## 2018-07-17 DIAGNOSIS — M25662 Stiffness of left knee, not elsewhere classified: Secondary | ICD-10-CM | POA: Diagnosis not present

## 2018-07-17 DIAGNOSIS — R262 Difficulty in walking, not elsewhere classified: Secondary | ICD-10-CM | POA: Diagnosis not present

## 2018-07-17 DIAGNOSIS — M25462 Effusion, left knee: Secondary | ICD-10-CM | POA: Diagnosis not present

## 2018-07-24 DIAGNOSIS — M25662 Stiffness of left knee, not elsewhere classified: Secondary | ICD-10-CM | POA: Diagnosis not present

## 2018-07-24 DIAGNOSIS — R262 Difficulty in walking, not elsewhere classified: Secondary | ICD-10-CM | POA: Diagnosis not present

## 2018-07-24 DIAGNOSIS — M25562 Pain in left knee: Secondary | ICD-10-CM | POA: Diagnosis not present

## 2018-07-24 DIAGNOSIS — M25462 Effusion, left knee: Secondary | ICD-10-CM | POA: Diagnosis not present

## 2018-08-02 DIAGNOSIS — M25561 Pain in right knee: Secondary | ICD-10-CM | POA: Diagnosis not present

## 2018-08-02 DIAGNOSIS — M25661 Stiffness of right knee, not elsewhere classified: Secondary | ICD-10-CM | POA: Diagnosis not present

## 2018-08-02 DIAGNOSIS — R262 Difficulty in walking, not elsewhere classified: Secondary | ICD-10-CM | POA: Diagnosis not present

## 2018-08-02 DIAGNOSIS — M6281 Muscle weakness (generalized): Secondary | ICD-10-CM | POA: Diagnosis not present

## 2018-08-08 DIAGNOSIS — M25561 Pain in right knee: Secondary | ICD-10-CM | POA: Diagnosis not present

## 2018-08-08 DIAGNOSIS — M25661 Stiffness of right knee, not elsewhere classified: Secondary | ICD-10-CM | POA: Diagnosis not present

## 2018-08-08 DIAGNOSIS — M6281 Muscle weakness (generalized): Secondary | ICD-10-CM | POA: Diagnosis not present

## 2018-08-08 DIAGNOSIS — R262 Difficulty in walking, not elsewhere classified: Secondary | ICD-10-CM | POA: Diagnosis not present

## 2018-08-15 DIAGNOSIS — M1711 Unilateral primary osteoarthritis, right knee: Secondary | ICD-10-CM | POA: Diagnosis not present

## 2018-08-15 DIAGNOSIS — M25512 Pain in left shoulder: Secondary | ICD-10-CM | POA: Diagnosis not present

## 2018-08-15 DIAGNOSIS — M25511 Pain in right shoulder: Secondary | ICD-10-CM | POA: Diagnosis not present

## 2018-08-21 DIAGNOSIS — M25561 Pain in right knee: Secondary | ICD-10-CM | POA: Diagnosis not present

## 2018-08-21 DIAGNOSIS — R262 Difficulty in walking, not elsewhere classified: Secondary | ICD-10-CM | POA: Diagnosis not present

## 2018-08-21 DIAGNOSIS — M25661 Stiffness of right knee, not elsewhere classified: Secondary | ICD-10-CM | POA: Diagnosis not present

## 2018-08-21 DIAGNOSIS — M6281 Muscle weakness (generalized): Secondary | ICD-10-CM | POA: Diagnosis not present

## 2018-08-22 DIAGNOSIS — H7203 Central perforation of tympanic membrane, bilateral: Secondary | ICD-10-CM | POA: Diagnosis not present

## 2018-08-22 DIAGNOSIS — H905 Unspecified sensorineural hearing loss: Secondary | ICD-10-CM | POA: Diagnosis not present

## 2018-08-22 DIAGNOSIS — H8102 Meniere's disease, left ear: Secondary | ICD-10-CM | POA: Diagnosis not present

## 2018-08-22 DIAGNOSIS — H6983 Other specified disorders of Eustachian tube, bilateral: Secondary | ICD-10-CM | POA: Diagnosis not present

## 2018-08-23 DIAGNOSIS — I10 Essential (primary) hypertension: Secondary | ICD-10-CM | POA: Diagnosis not present

## 2018-08-29 DIAGNOSIS — R262 Difficulty in walking, not elsewhere classified: Secondary | ICD-10-CM | POA: Diagnosis not present

## 2018-08-29 DIAGNOSIS — M6281 Muscle weakness (generalized): Secondary | ICD-10-CM | POA: Diagnosis not present

## 2018-08-29 DIAGNOSIS — M25561 Pain in right knee: Secondary | ICD-10-CM | POA: Diagnosis not present

## 2018-08-29 DIAGNOSIS — M25661 Stiffness of right knee, not elsewhere classified: Secondary | ICD-10-CM | POA: Diagnosis not present

## 2018-09-07 DIAGNOSIS — M1711 Unilateral primary osteoarthritis, right knee: Secondary | ICD-10-CM | POA: Diagnosis not present

## 2018-09-12 DIAGNOSIS — M25561 Pain in right knee: Secondary | ICD-10-CM | POA: Diagnosis not present

## 2018-09-12 DIAGNOSIS — M6281 Muscle weakness (generalized): Secondary | ICD-10-CM | POA: Diagnosis not present

## 2018-09-12 DIAGNOSIS — M25661 Stiffness of right knee, not elsewhere classified: Secondary | ICD-10-CM | POA: Diagnosis not present

## 2018-09-12 DIAGNOSIS — R262 Difficulty in walking, not elsewhere classified: Secondary | ICD-10-CM | POA: Diagnosis not present

## 2018-09-14 DIAGNOSIS — M1711 Unilateral primary osteoarthritis, right knee: Secondary | ICD-10-CM | POA: Diagnosis not present

## 2018-09-21 DIAGNOSIS — M1711 Unilateral primary osteoarthritis, right knee: Secondary | ICD-10-CM | POA: Diagnosis not present

## 2018-09-21 DIAGNOSIS — M25511 Pain in right shoulder: Secondary | ICD-10-CM | POA: Diagnosis not present

## 2018-09-29 DIAGNOSIS — M25511 Pain in right shoulder: Secondary | ICD-10-CM | POA: Diagnosis not present

## 2018-10-19 DIAGNOSIS — M75101 Unspecified rotator cuff tear or rupture of right shoulder, not specified as traumatic: Secondary | ICD-10-CM | POA: Diagnosis not present

## 2018-10-19 DIAGNOSIS — M25511 Pain in right shoulder: Secondary | ICD-10-CM | POA: Diagnosis not present

## 2018-10-19 DIAGNOSIS — M1711 Unilateral primary osteoarthritis, right knee: Secondary | ICD-10-CM | POA: Diagnosis not present

## 2018-11-16 DIAGNOSIS — H7203 Central perforation of tympanic membrane, bilateral: Secondary | ICD-10-CM | POA: Diagnosis not present

## 2018-11-16 DIAGNOSIS — H8102 Meniere's disease, left ear: Secondary | ICD-10-CM | POA: Diagnosis not present

## 2018-11-16 DIAGNOSIS — H6983 Other specified disorders of Eustachian tube, bilateral: Secondary | ICD-10-CM | POA: Diagnosis not present

## 2018-11-16 DIAGNOSIS — H905 Unspecified sensorineural hearing loss: Secondary | ICD-10-CM | POA: Diagnosis not present

## 2018-11-24 DIAGNOSIS — Z23 Encounter for immunization: Secondary | ICD-10-CM | POA: Diagnosis not present

## 2018-11-24 DIAGNOSIS — D72829 Elevated white blood cell count, unspecified: Secondary | ICD-10-CM | POA: Diagnosis not present

## 2018-11-24 DIAGNOSIS — I10 Essential (primary) hypertension: Secondary | ICD-10-CM | POA: Diagnosis not present

## 2018-11-24 DIAGNOSIS — Z13220 Encounter for screening for lipoid disorders: Secondary | ICD-10-CM | POA: Diagnosis not present

## 2018-11-24 DIAGNOSIS — Z0000 Encounter for general adult medical examination without abnormal findings: Secondary | ICD-10-CM | POA: Diagnosis not present

## 2018-11-27 DIAGNOSIS — H5010 Unspecified exotropia: Secondary | ICD-10-CM | POA: Diagnosis not present

## 2018-11-27 DIAGNOSIS — H2513 Age-related nuclear cataract, bilateral: Secondary | ICD-10-CM | POA: Diagnosis not present

## 2018-11-27 DIAGNOSIS — H5212 Myopia, left eye: Secondary | ICD-10-CM | POA: Diagnosis not present

## 2018-12-14 DIAGNOSIS — R293 Abnormal posture: Secondary | ICD-10-CM | POA: Diagnosis not present

## 2018-12-14 DIAGNOSIS — M25611 Stiffness of right shoulder, not elsewhere classified: Secondary | ICD-10-CM | POA: Diagnosis not present

## 2018-12-14 DIAGNOSIS — M25511 Pain in right shoulder: Secondary | ICD-10-CM | POA: Diagnosis not present

## 2018-12-14 DIAGNOSIS — M25512 Pain in left shoulder: Secondary | ICD-10-CM | POA: Diagnosis not present

## 2018-12-19 DIAGNOSIS — R293 Abnormal posture: Secondary | ICD-10-CM | POA: Diagnosis not present

## 2018-12-19 DIAGNOSIS — M25511 Pain in right shoulder: Secondary | ICD-10-CM | POA: Diagnosis not present

## 2018-12-19 DIAGNOSIS — M25611 Stiffness of right shoulder, not elsewhere classified: Secondary | ICD-10-CM | POA: Diagnosis not present

## 2018-12-19 DIAGNOSIS — M25512 Pain in left shoulder: Secondary | ICD-10-CM | POA: Diagnosis not present

## 2018-12-21 DIAGNOSIS — R293 Abnormal posture: Secondary | ICD-10-CM | POA: Diagnosis not present

## 2018-12-21 DIAGNOSIS — M25511 Pain in right shoulder: Secondary | ICD-10-CM | POA: Diagnosis not present

## 2018-12-21 DIAGNOSIS — M25512 Pain in left shoulder: Secondary | ICD-10-CM | POA: Diagnosis not present

## 2018-12-21 DIAGNOSIS — M25611 Stiffness of right shoulder, not elsewhere classified: Secondary | ICD-10-CM | POA: Diagnosis not present

## 2018-12-25 DIAGNOSIS — M25511 Pain in right shoulder: Secondary | ICD-10-CM | POA: Diagnosis not present

## 2018-12-25 DIAGNOSIS — R293 Abnormal posture: Secondary | ICD-10-CM | POA: Diagnosis not present

## 2018-12-25 DIAGNOSIS — M25611 Stiffness of right shoulder, not elsewhere classified: Secondary | ICD-10-CM | POA: Diagnosis not present

## 2018-12-25 DIAGNOSIS — M25512 Pain in left shoulder: Secondary | ICD-10-CM | POA: Diagnosis not present

## 2018-12-28 DIAGNOSIS — R293 Abnormal posture: Secondary | ICD-10-CM | POA: Diagnosis not present

## 2018-12-28 DIAGNOSIS — M25512 Pain in left shoulder: Secondary | ICD-10-CM | POA: Diagnosis not present

## 2018-12-28 DIAGNOSIS — M25511 Pain in right shoulder: Secondary | ICD-10-CM | POA: Diagnosis not present

## 2018-12-28 DIAGNOSIS — M25611 Stiffness of right shoulder, not elsewhere classified: Secondary | ICD-10-CM | POA: Diagnosis not present

## 2019-01-02 DIAGNOSIS — R293 Abnormal posture: Secondary | ICD-10-CM | POA: Diagnosis not present

## 2019-01-02 DIAGNOSIS — M25611 Stiffness of right shoulder, not elsewhere classified: Secondary | ICD-10-CM | POA: Diagnosis not present

## 2019-01-02 DIAGNOSIS — M25511 Pain in right shoulder: Secondary | ICD-10-CM | POA: Diagnosis not present

## 2019-01-02 DIAGNOSIS — M25512 Pain in left shoulder: Secondary | ICD-10-CM | POA: Diagnosis not present

## 2019-01-08 ENCOUNTER — Other Ambulatory Visit: Payer: Self-pay | Admitting: Family Medicine

## 2019-01-08 DIAGNOSIS — Z1231 Encounter for screening mammogram for malignant neoplasm of breast: Secondary | ICD-10-CM

## 2019-01-09 DIAGNOSIS — M25611 Stiffness of right shoulder, not elsewhere classified: Secondary | ICD-10-CM | POA: Diagnosis not present

## 2019-01-09 DIAGNOSIS — M25511 Pain in right shoulder: Secondary | ICD-10-CM | POA: Diagnosis not present

## 2019-01-09 DIAGNOSIS — R293 Abnormal posture: Secondary | ICD-10-CM | POA: Diagnosis not present

## 2019-01-09 DIAGNOSIS — M25512 Pain in left shoulder: Secondary | ICD-10-CM | POA: Diagnosis not present

## 2019-01-11 DIAGNOSIS — M25611 Stiffness of right shoulder, not elsewhere classified: Secondary | ICD-10-CM | POA: Diagnosis not present

## 2019-01-11 DIAGNOSIS — M25511 Pain in right shoulder: Secondary | ICD-10-CM | POA: Diagnosis not present

## 2019-01-11 DIAGNOSIS — M25512 Pain in left shoulder: Secondary | ICD-10-CM | POA: Diagnosis not present

## 2019-01-11 DIAGNOSIS — R293 Abnormal posture: Secondary | ICD-10-CM | POA: Diagnosis not present

## 2019-01-16 DIAGNOSIS — M25611 Stiffness of right shoulder, not elsewhere classified: Secondary | ICD-10-CM | POA: Diagnosis not present

## 2019-01-16 DIAGNOSIS — M25511 Pain in right shoulder: Secondary | ICD-10-CM | POA: Diagnosis not present

## 2019-01-16 DIAGNOSIS — R293 Abnormal posture: Secondary | ICD-10-CM | POA: Diagnosis not present

## 2019-01-16 DIAGNOSIS — M25512 Pain in left shoulder: Secondary | ICD-10-CM | POA: Diagnosis not present

## 2019-01-18 DIAGNOSIS — M25511 Pain in right shoulder: Secondary | ICD-10-CM | POA: Diagnosis not present

## 2019-01-18 DIAGNOSIS — M25512 Pain in left shoulder: Secondary | ICD-10-CM | POA: Diagnosis not present

## 2019-01-18 DIAGNOSIS — M25611 Stiffness of right shoulder, not elsewhere classified: Secondary | ICD-10-CM | POA: Diagnosis not present

## 2019-01-18 DIAGNOSIS — R293 Abnormal posture: Secondary | ICD-10-CM | POA: Diagnosis not present

## 2019-01-23 DIAGNOSIS — M25511 Pain in right shoulder: Secondary | ICD-10-CM | POA: Diagnosis not present

## 2019-01-23 DIAGNOSIS — M25512 Pain in left shoulder: Secondary | ICD-10-CM | POA: Diagnosis not present

## 2019-01-23 DIAGNOSIS — R293 Abnormal posture: Secondary | ICD-10-CM | POA: Diagnosis not present

## 2019-01-23 DIAGNOSIS — M25611 Stiffness of right shoulder, not elsewhere classified: Secondary | ICD-10-CM | POA: Diagnosis not present

## 2019-01-25 DIAGNOSIS — R293 Abnormal posture: Secondary | ICD-10-CM | POA: Diagnosis not present

## 2019-01-25 DIAGNOSIS — M25512 Pain in left shoulder: Secondary | ICD-10-CM | POA: Diagnosis not present

## 2019-01-25 DIAGNOSIS — M25511 Pain in right shoulder: Secondary | ICD-10-CM | POA: Diagnosis not present

## 2019-01-25 DIAGNOSIS — M25611 Stiffness of right shoulder, not elsewhere classified: Secondary | ICD-10-CM | POA: Diagnosis not present

## 2019-01-30 DIAGNOSIS — M25611 Stiffness of right shoulder, not elsewhere classified: Secondary | ICD-10-CM | POA: Diagnosis not present

## 2019-01-30 DIAGNOSIS — M25511 Pain in right shoulder: Secondary | ICD-10-CM | POA: Diagnosis not present

## 2019-01-30 DIAGNOSIS — M25512 Pain in left shoulder: Secondary | ICD-10-CM | POA: Diagnosis not present

## 2019-01-30 DIAGNOSIS — R293 Abnormal posture: Secondary | ICD-10-CM | POA: Diagnosis not present

## 2019-02-01 DIAGNOSIS — R293 Abnormal posture: Secondary | ICD-10-CM | POA: Diagnosis not present

## 2019-02-01 DIAGNOSIS — M25512 Pain in left shoulder: Secondary | ICD-10-CM | POA: Diagnosis not present

## 2019-02-01 DIAGNOSIS — M25611 Stiffness of right shoulder, not elsewhere classified: Secondary | ICD-10-CM | POA: Diagnosis not present

## 2019-02-01 DIAGNOSIS — M25511 Pain in right shoulder: Secondary | ICD-10-CM | POA: Diagnosis not present

## 2019-02-06 DIAGNOSIS — M25511 Pain in right shoulder: Secondary | ICD-10-CM | POA: Diagnosis not present

## 2019-02-06 DIAGNOSIS — M25611 Stiffness of right shoulder, not elsewhere classified: Secondary | ICD-10-CM | POA: Diagnosis not present

## 2019-02-06 DIAGNOSIS — M25512 Pain in left shoulder: Secondary | ICD-10-CM | POA: Diagnosis not present

## 2019-02-06 DIAGNOSIS — R293 Abnormal posture: Secondary | ICD-10-CM | POA: Diagnosis not present

## 2019-02-08 DIAGNOSIS — M25511 Pain in right shoulder: Secondary | ICD-10-CM | POA: Diagnosis not present

## 2019-02-08 DIAGNOSIS — R293 Abnormal posture: Secondary | ICD-10-CM | POA: Diagnosis not present

## 2019-02-08 DIAGNOSIS — M25512 Pain in left shoulder: Secondary | ICD-10-CM | POA: Diagnosis not present

## 2019-02-08 DIAGNOSIS — M25611 Stiffness of right shoulder, not elsewhere classified: Secondary | ICD-10-CM | POA: Diagnosis not present

## 2019-02-13 DIAGNOSIS — M25512 Pain in left shoulder: Secondary | ICD-10-CM | POA: Diagnosis not present

## 2019-02-13 DIAGNOSIS — M25611 Stiffness of right shoulder, not elsewhere classified: Secondary | ICD-10-CM | POA: Diagnosis not present

## 2019-02-13 DIAGNOSIS — R293 Abnormal posture: Secondary | ICD-10-CM | POA: Diagnosis not present

## 2019-02-13 DIAGNOSIS — M25511 Pain in right shoulder: Secondary | ICD-10-CM | POA: Diagnosis not present

## 2019-02-15 DIAGNOSIS — M25511 Pain in right shoulder: Secondary | ICD-10-CM | POA: Diagnosis not present

## 2019-02-15 DIAGNOSIS — M542 Cervicalgia: Secondary | ICD-10-CM | POA: Diagnosis not present

## 2019-02-15 DIAGNOSIS — M25611 Stiffness of right shoulder, not elsewhere classified: Secondary | ICD-10-CM | POA: Diagnosis not present

## 2019-02-15 DIAGNOSIS — R293 Abnormal posture: Secondary | ICD-10-CM | POA: Diagnosis not present

## 2019-02-20 DIAGNOSIS — R293 Abnormal posture: Secondary | ICD-10-CM | POA: Diagnosis not present

## 2019-02-20 DIAGNOSIS — M25511 Pain in right shoulder: Secondary | ICD-10-CM | POA: Diagnosis not present

## 2019-02-20 DIAGNOSIS — M25611 Stiffness of right shoulder, not elsewhere classified: Secondary | ICD-10-CM | POA: Diagnosis not present

## 2019-02-20 DIAGNOSIS — M542 Cervicalgia: Secondary | ICD-10-CM | POA: Diagnosis not present

## 2019-02-22 DIAGNOSIS — M542 Cervicalgia: Secondary | ICD-10-CM | POA: Diagnosis not present

## 2019-02-22 DIAGNOSIS — R293 Abnormal posture: Secondary | ICD-10-CM | POA: Diagnosis not present

## 2019-02-22 DIAGNOSIS — M25611 Stiffness of right shoulder, not elsewhere classified: Secondary | ICD-10-CM | POA: Diagnosis not present

## 2019-02-22 DIAGNOSIS — M25511 Pain in right shoulder: Secondary | ICD-10-CM | POA: Diagnosis not present

## 2019-02-26 DIAGNOSIS — R293 Abnormal posture: Secondary | ICD-10-CM | POA: Diagnosis not present

## 2019-02-26 DIAGNOSIS — M542 Cervicalgia: Secondary | ICD-10-CM | POA: Diagnosis not present

## 2019-02-26 DIAGNOSIS — M25511 Pain in right shoulder: Secondary | ICD-10-CM | POA: Diagnosis not present

## 2019-02-26 DIAGNOSIS — M25611 Stiffness of right shoulder, not elsewhere classified: Secondary | ICD-10-CM | POA: Diagnosis not present

## 2019-02-27 ENCOUNTER — Ambulatory Visit: Payer: BLUE CROSS/BLUE SHIELD

## 2019-03-05 DIAGNOSIS — M25611 Stiffness of right shoulder, not elsewhere classified: Secondary | ICD-10-CM | POA: Diagnosis not present

## 2019-03-05 DIAGNOSIS — M25511 Pain in right shoulder: Secondary | ICD-10-CM | POA: Diagnosis not present

## 2019-03-05 DIAGNOSIS — M542 Cervicalgia: Secondary | ICD-10-CM | POA: Diagnosis not present

## 2019-03-05 DIAGNOSIS — R293 Abnormal posture: Secondary | ICD-10-CM | POA: Diagnosis not present

## 2019-03-12 DIAGNOSIS — M542 Cervicalgia: Secondary | ICD-10-CM | POA: Diagnosis not present

## 2019-03-12 DIAGNOSIS — M25511 Pain in right shoulder: Secondary | ICD-10-CM | POA: Diagnosis not present

## 2019-03-12 DIAGNOSIS — M25611 Stiffness of right shoulder, not elsewhere classified: Secondary | ICD-10-CM | POA: Diagnosis not present

## 2019-03-12 DIAGNOSIS — R293 Abnormal posture: Secondary | ICD-10-CM | POA: Diagnosis not present

## 2019-03-15 DIAGNOSIS — M25611 Stiffness of right shoulder, not elsewhere classified: Secondary | ICD-10-CM | POA: Diagnosis not present

## 2019-03-15 DIAGNOSIS — R293 Abnormal posture: Secondary | ICD-10-CM | POA: Diagnosis not present

## 2019-03-15 DIAGNOSIS — M542 Cervicalgia: Secondary | ICD-10-CM | POA: Diagnosis not present

## 2019-03-15 DIAGNOSIS — M25511 Pain in right shoulder: Secondary | ICD-10-CM | POA: Diagnosis not present

## 2019-03-19 DIAGNOSIS — M25611 Stiffness of right shoulder, not elsewhere classified: Secondary | ICD-10-CM | POA: Diagnosis not present

## 2019-03-19 DIAGNOSIS — M542 Cervicalgia: Secondary | ICD-10-CM | POA: Diagnosis not present

## 2019-03-19 DIAGNOSIS — R293 Abnormal posture: Secondary | ICD-10-CM | POA: Diagnosis not present

## 2019-03-19 DIAGNOSIS — M25511 Pain in right shoulder: Secondary | ICD-10-CM | POA: Diagnosis not present

## 2019-03-26 DIAGNOSIS — R293 Abnormal posture: Secondary | ICD-10-CM | POA: Diagnosis not present

## 2019-03-26 DIAGNOSIS — M542 Cervicalgia: Secondary | ICD-10-CM | POA: Diagnosis not present

## 2019-03-26 DIAGNOSIS — M25511 Pain in right shoulder: Secondary | ICD-10-CM | POA: Diagnosis not present

## 2019-03-26 DIAGNOSIS — M25611 Stiffness of right shoulder, not elsewhere classified: Secondary | ICD-10-CM | POA: Diagnosis not present

## 2019-04-02 DIAGNOSIS — M542 Cervicalgia: Secondary | ICD-10-CM | POA: Diagnosis not present

## 2019-04-02 DIAGNOSIS — M25611 Stiffness of right shoulder, not elsewhere classified: Secondary | ICD-10-CM | POA: Diagnosis not present

## 2019-04-02 DIAGNOSIS — R293 Abnormal posture: Secondary | ICD-10-CM | POA: Diagnosis not present

## 2019-04-02 DIAGNOSIS — M25511 Pain in right shoulder: Secondary | ICD-10-CM | POA: Diagnosis not present

## 2019-04-09 DIAGNOSIS — M542 Cervicalgia: Secondary | ICD-10-CM | POA: Diagnosis not present

## 2019-04-09 DIAGNOSIS — M25611 Stiffness of right shoulder, not elsewhere classified: Secondary | ICD-10-CM | POA: Diagnosis not present

## 2019-04-09 DIAGNOSIS — M25511 Pain in right shoulder: Secondary | ICD-10-CM | POA: Diagnosis not present

## 2019-04-09 DIAGNOSIS — R293 Abnormal posture: Secondary | ICD-10-CM | POA: Diagnosis not present

## 2019-04-10 DIAGNOSIS — H6122 Impacted cerumen, left ear: Secondary | ICD-10-CM | POA: Diagnosis not present

## 2019-04-10 DIAGNOSIS — I10 Essential (primary) hypertension: Secondary | ICD-10-CM | POA: Diagnosis not present

## 2019-04-17 DIAGNOSIS — M1711 Unilateral primary osteoarthritis, right knee: Secondary | ICD-10-CM | POA: Diagnosis not present

## 2019-04-19 ENCOUNTER — Ambulatory Visit: Payer: BLUE CROSS/BLUE SHIELD

## 2019-04-24 DIAGNOSIS — M1711 Unilateral primary osteoarthritis, right knee: Secondary | ICD-10-CM | POA: Diagnosis not present

## 2019-04-24 DIAGNOSIS — M25611 Stiffness of right shoulder, not elsewhere classified: Secondary | ICD-10-CM | POA: Diagnosis not present

## 2019-04-24 DIAGNOSIS — R293 Abnormal posture: Secondary | ICD-10-CM | POA: Diagnosis not present

## 2019-04-24 DIAGNOSIS — M25511 Pain in right shoulder: Secondary | ICD-10-CM | POA: Diagnosis not present

## 2019-04-24 DIAGNOSIS — M542 Cervicalgia: Secondary | ICD-10-CM | POA: Diagnosis not present

## 2019-05-01 DIAGNOSIS — M1711 Unilateral primary osteoarthritis, right knee: Secondary | ICD-10-CM | POA: Diagnosis not present

## 2019-05-08 DIAGNOSIS — M25611 Stiffness of right shoulder, not elsewhere classified: Secondary | ICD-10-CM | POA: Diagnosis not present

## 2019-05-08 DIAGNOSIS — M542 Cervicalgia: Secondary | ICD-10-CM | POA: Diagnosis not present

## 2019-05-08 DIAGNOSIS — R293 Abnormal posture: Secondary | ICD-10-CM | POA: Diagnosis not present

## 2019-05-08 DIAGNOSIS — M25511 Pain in right shoulder: Secondary | ICD-10-CM | POA: Diagnosis not present

## 2019-05-15 ENCOUNTER — Ambulatory Visit: Payer: BLUE CROSS/BLUE SHIELD

## 2019-05-24 DIAGNOSIS — Z471 Aftercare following joint replacement surgery: Secondary | ICD-10-CM | POA: Diagnosis not present

## 2019-05-24 DIAGNOSIS — Z96652 Presence of left artificial knee joint: Secondary | ICD-10-CM | POA: Diagnosis not present

## 2019-06-13 DIAGNOSIS — H722X1 Other marginal perforations of tympanic membrane, right ear: Secondary | ICD-10-CM | POA: Diagnosis not present

## 2019-06-14 ENCOUNTER — Other Ambulatory Visit: Payer: Self-pay

## 2019-06-14 ENCOUNTER — Ambulatory Visit
Admission: RE | Admit: 2019-06-14 | Discharge: 2019-06-14 | Disposition: A | Discharge status: Home / Self Care | Payer: BC Managed Care – PPO | Source: Ambulatory Visit | Attending: Family Medicine | Admitting: Family Medicine

## 2019-06-14 DIAGNOSIS — Z1231 Encounter for screening mammogram for malignant neoplasm of breast: Secondary | ICD-10-CM

## 2019-06-18 ENCOUNTER — Other Ambulatory Visit: Payer: Self-pay | Admitting: Family Medicine

## 2019-06-18 DIAGNOSIS — R928 Other abnormal and inconclusive findings on diagnostic imaging of breast: Secondary | ICD-10-CM

## 2019-06-18 DIAGNOSIS — Z1159 Encounter for screening for other viral diseases: Secondary | ICD-10-CM | POA: Diagnosis not present

## 2019-06-29 ENCOUNTER — Ambulatory Visit
Admission: RE | Admit: 2019-06-29 | Discharge: 2019-06-29 | Disposition: A | Discharge status: Home / Self Care | Payer: BC Managed Care – PPO | Source: Ambulatory Visit | Attending: Family Medicine | Admitting: Family Medicine

## 2019-06-29 ENCOUNTER — Other Ambulatory Visit: Payer: Self-pay

## 2019-06-29 DIAGNOSIS — R928 Other abnormal and inconclusive findings on diagnostic imaging of breast: Secondary | ICD-10-CM

## 2019-06-29 DIAGNOSIS — N6001 Solitary cyst of right breast: Secondary | ICD-10-CM | POA: Diagnosis not present

## 2019-07-03 DIAGNOSIS — M2550 Pain in unspecified joint: Secondary | ICD-10-CM | POA: Diagnosis not present

## 2019-07-03 DIAGNOSIS — M150 Primary generalized (osteo)arthritis: Secondary | ICD-10-CM | POA: Diagnosis not present

## 2019-07-03 DIAGNOSIS — M5030 Other cervical disc degeneration, unspecified cervical region: Secondary | ICD-10-CM | POA: Diagnosis not present

## 2019-07-03 DIAGNOSIS — M5136 Other intervertebral disc degeneration, lumbar region: Secondary | ICD-10-CM | POA: Diagnosis not present

## 2019-07-18 DIAGNOSIS — B351 Tinea unguium: Secondary | ICD-10-CM | POA: Diagnosis not present

## 2019-07-18 DIAGNOSIS — L309 Dermatitis, unspecified: Secondary | ICD-10-CM | POA: Diagnosis not present

## 2019-08-15 DIAGNOSIS — Z6823 Body mass index (BMI) 23.0-23.9, adult: Secondary | ICD-10-CM | POA: Diagnosis not present

## 2019-08-15 DIAGNOSIS — Z01419 Encounter for gynecological examination (general) (routine) without abnormal findings: Secondary | ICD-10-CM | POA: Diagnosis not present

## 2019-09-13 DIAGNOSIS — M25511 Pain in right shoulder: Secondary | ICD-10-CM | POA: Diagnosis not present

## 2019-09-13 DIAGNOSIS — M25512 Pain in left shoulder: Secondary | ICD-10-CM | POA: Diagnosis not present

## 2019-11-26 DIAGNOSIS — I10 Essential (primary) hypertension: Secondary | ICD-10-CM | POA: Diagnosis not present

## 2019-11-26 DIAGNOSIS — Z0000 Encounter for general adult medical examination without abnormal findings: Secondary | ICD-10-CM | POA: Diagnosis not present

## 2019-11-26 DIAGNOSIS — Z13220 Encounter for screening for lipoid disorders: Secondary | ICD-10-CM | POA: Diagnosis not present

## 2020-01-06 ENCOUNTER — Telehealth: Payer: Self-pay

## 2020-01-06 NOTE — Telephone Encounter (Signed)
Patient called to ask to be placed on the COVID Vaccine wait list. I placed the patient on the wait list and advised she will receive an email once the vaccine is available. She declined to set up MyChart as she doesn't have a [facility] PCP.

## 2020-01-10 ENCOUNTER — Ambulatory Visit: Payer: BC Managed Care – PPO

## 2020-01-12 DIAGNOSIS — Z23 Encounter for immunization: Secondary | ICD-10-CM | POA: Diagnosis not present

## 2020-01-19 ENCOUNTER — Ambulatory Visit: Payer: BC Managed Care – PPO

## 2020-01-21 ENCOUNTER — Ambulatory Visit: Payer: BC Managed Care – PPO

## 2020-01-21 DIAGNOSIS — J310 Chronic rhinitis: Secondary | ICD-10-CM | POA: Diagnosis not present

## 2020-01-23 DIAGNOSIS — M1711 Unilateral primary osteoarthritis, right knee: Secondary | ICD-10-CM | POA: Diagnosis not present

## 2020-01-29 DIAGNOSIS — M1711 Unilateral primary osteoarthritis, right knee: Secondary | ICD-10-CM | POA: Diagnosis not present

## 2020-02-05 DIAGNOSIS — M1711 Unilateral primary osteoarthritis, right knee: Secondary | ICD-10-CM | POA: Diagnosis not present

## 2020-02-09 DIAGNOSIS — Z23 Encounter for immunization: Secondary | ICD-10-CM | POA: Diagnosis not present

## 2020-02-19 DIAGNOSIS — M25511 Pain in right shoulder: Secondary | ICD-10-CM | POA: Diagnosis not present

## 2020-02-19 DIAGNOSIS — M19111 Post-traumatic osteoarthritis, right shoulder: Secondary | ICD-10-CM | POA: Diagnosis not present

## 2020-02-28 DIAGNOSIS — M19111 Post-traumatic osteoarthritis, right shoulder: Secondary | ICD-10-CM | POA: Diagnosis not present

## 2020-02-28 DIAGNOSIS — M25511 Pain in right shoulder: Secondary | ICD-10-CM | POA: Diagnosis not present

## 2020-02-29 ENCOUNTER — Other Ambulatory Visit: Payer: Self-pay | Admitting: Orthopedic Surgery

## 2020-02-29 DIAGNOSIS — M25511 Pain in right shoulder: Secondary | ICD-10-CM

## 2020-03-10 DIAGNOSIS — M25611 Stiffness of right shoulder, not elsewhere classified: Secondary | ICD-10-CM | POA: Diagnosis not present

## 2020-03-10 DIAGNOSIS — M25511 Pain in right shoulder: Secondary | ICD-10-CM | POA: Diagnosis not present

## 2020-03-10 DIAGNOSIS — R293 Abnormal posture: Secondary | ICD-10-CM | POA: Diagnosis not present

## 2020-03-10 DIAGNOSIS — M6281 Muscle weakness (generalized): Secondary | ICD-10-CM | POA: Diagnosis not present

## 2020-03-12 DIAGNOSIS — J0101 Acute recurrent maxillary sinusitis: Secondary | ICD-10-CM | POA: Diagnosis not present

## 2020-03-14 ENCOUNTER — Ambulatory Visit
Admission: RE | Admit: 2020-03-14 | Discharge: 2020-03-14 | Disposition: A | Discharge status: Home / Self Care | Payer: BC Managed Care – PPO | Source: Ambulatory Visit | Attending: Orthopedic Surgery | Admitting: Orthopedic Surgery

## 2020-03-14 DIAGNOSIS — M19011 Primary osteoarthritis, right shoulder: Secondary | ICD-10-CM | POA: Diagnosis not present

## 2020-03-14 DIAGNOSIS — M25511 Pain in right shoulder: Secondary | ICD-10-CM

## 2020-03-17 DIAGNOSIS — M25511 Pain in right shoulder: Secondary | ICD-10-CM | POA: Diagnosis not present

## 2020-03-17 DIAGNOSIS — R293 Abnormal posture: Secondary | ICD-10-CM | POA: Diagnosis not present

## 2020-03-17 DIAGNOSIS — M6281 Muscle weakness (generalized): Secondary | ICD-10-CM | POA: Diagnosis not present

## 2020-03-17 DIAGNOSIS — M25611 Stiffness of right shoulder, not elsewhere classified: Secondary | ICD-10-CM | POA: Diagnosis not present

## 2020-04-01 DIAGNOSIS — M25511 Pain in right shoulder: Secondary | ICD-10-CM | POA: Diagnosis not present

## 2020-04-04 DIAGNOSIS — M6281 Muscle weakness (generalized): Secondary | ICD-10-CM | POA: Diagnosis not present

## 2020-04-04 DIAGNOSIS — M25611 Stiffness of right shoulder, not elsewhere classified: Secondary | ICD-10-CM | POA: Diagnosis not present

## 2020-04-04 DIAGNOSIS — R293 Abnormal posture: Secondary | ICD-10-CM | POA: Diagnosis not present

## 2020-04-04 DIAGNOSIS — M25511 Pain in right shoulder: Secondary | ICD-10-CM | POA: Diagnosis not present

## 2020-04-18 DIAGNOSIS — M25611 Stiffness of right shoulder, not elsewhere classified: Secondary | ICD-10-CM | POA: Diagnosis not present

## 2020-04-18 DIAGNOSIS — M25511 Pain in right shoulder: Secondary | ICD-10-CM | POA: Diagnosis not present

## 2020-04-18 DIAGNOSIS — R293 Abnormal posture: Secondary | ICD-10-CM | POA: Diagnosis not present

## 2020-04-18 DIAGNOSIS — M6281 Muscle weakness (generalized): Secondary | ICD-10-CM | POA: Diagnosis not present

## 2020-04-22 DIAGNOSIS — I10 Essential (primary) hypertension: Secondary | ICD-10-CM | POA: Diagnosis not present

## 2020-04-22 DIAGNOSIS — Z5181 Encounter for therapeutic drug level monitoring: Secondary | ICD-10-CM | POA: Diagnosis not present

## 2020-04-22 DIAGNOSIS — D72829 Elevated white blood cell count, unspecified: Secondary | ICD-10-CM | POA: Diagnosis not present

## 2020-04-22 DIAGNOSIS — R399 Unspecified symptoms and signs involving the genitourinary system: Secondary | ICD-10-CM | POA: Diagnosis not present

## 2020-05-13 ENCOUNTER — Encounter (HOSPITAL_COMMUNITY)
Admission: RE | Admit: 2020-05-13 | Discharge: 2020-05-13 | Disposition: A | Discharge status: Home / Self Care | Payer: BC Managed Care – PPO | Source: Ambulatory Visit | Attending: Orthopedic Surgery | Admitting: Orthopedic Surgery

## 2020-05-13 ENCOUNTER — Other Ambulatory Visit: Payer: Self-pay

## 2020-05-13 ENCOUNTER — Encounter (HOSPITAL_COMMUNITY): Payer: Self-pay

## 2020-05-13 ENCOUNTER — Encounter (INDEPENDENT_AMBULATORY_CARE_PROVIDER_SITE_OTHER): Payer: Self-pay

## 2020-05-13 DIAGNOSIS — Z01818 Encounter for other preprocedural examination: Secondary | ICD-10-CM | POA: Insufficient documentation

## 2020-05-13 LAB — SURGICAL PCR SCREEN
MRSA, PCR: NEGATIVE
Staphylococcus aureus: NEGATIVE

## 2020-05-13 LAB — BASIC METABOLIC PANEL
Anion gap: 9 (ref 5–15)
BUN: 16 mg/dL (ref 8–23)
CO2: 25 mmol/L (ref 22–32)
Calcium: 9.5 mg/dL (ref 8.9–10.3)
Chloride: 104 mmol/L (ref 98–111)
Creatinine, Ser: 0.99 mg/dL (ref 0.44–1.00)
GFR calc Af Amer: >60 mL/min (ref >60)
GFR calc non Af Amer: 58 mL/min — ABNORMAL LOW (ref >60)
Glucose, Bld: 105 mg/dL — ABNORMAL HIGH (ref 70–99)
Potassium: 4.4 mmol/L (ref 3.5–5.1)
Sodium: 138 mmol/L (ref 135–145)

## 2020-05-13 LAB — CBC
HCT: 51.5 % — ABNORMAL HIGH (ref 36.0–46.0)
Hemoglobin: 16.9 g/dL — ABNORMAL HIGH (ref 12.0–15.0)
MCH: 30 pg (ref 26.0–34.0)
MCHC: 32.8 g/dL (ref 30.0–36.0)
MCV: 91.3 fL (ref 80.0–100.0)
Platelets: 373 10*3/uL (ref 150–400)
RBC: 5.64 MIL/uL — ABNORMAL HIGH (ref 3.87–5.11)
RDW: 12.9 % (ref 11.5–15.5)
WBC: 10.1 10*3/uL (ref 4.0–10.5)
nRBC: 0 % (ref 0.0–0.2)

## 2020-05-13 NOTE — Progress Notes (Signed)
DUE TO COVID-19 ONLY ONE VISITOR IS ALLOWED TO COME WITH YOU AND STAY IN THE WAITING ROOM ONLY DURING PRE OP AND PROCEDURE DAY OF SURGERY. THE 1 VISITOR MAY VISIT WITH YOU AFTER SURGERY IN YOUR PRIVATE ROOM DURING VISITING HOURS ONLY!  YOU NEED TO HAVE A COVID 19 TEST ON_______ @_______ , THIS TEST MUST BE DONE BEFORE SURGERY, COME  Lynn Johns , [id number].  (Gross) ONCE YOUR COVID TEST IS COMPLETED, PLEASE BEGIN THE QUARANTINE INSTRUCTIONS AS OUTLINED IN YOUR HANDOUT.                Lynn Johns  05/13/2020   Your procedure is scheduled on:  05/23/20   Report to [facility] Main  Entrance   Report to admitting at  [place] AM     Call this number if you have problems the morning of surgery [phone]    Remember: Do not eat food   :After Midnight. BRUSH YOUR TEETH MORNING OF SURGERY AND RINSE YOUR MOUTH OUT, NO CHEWING GUM CANDY OR MINTS.     Take these medicines the morning of surgery with A SIP OF WATER:  Carvedilol, Allegra if needed                                 You may not have any metal on your body including hair pins and              piercings  Do not wear jewelry, make-up, lotions, powders or perfumes, deodorant             Do not wear nail polish on your fingernails.  Do not shave  48 hours prior to surgery.     Do not bring valuables to the hospital. [place].  Contacts, dentures or bridgework may not be worn into surgery.  Leave suitcase in the car. After surgery it may be brought to your room.     Patients discharged the day of surgery will not be allowed to drive home. IF YOU ARE HAVING SURGERY AND GOING HOME THE SAME DAY, YOU MUST HAVE AN ADULT TO DRIVE YOU HOME AND BE WITH YOU FOR 24 HOURS. YOU MAY GO HOME BY TAXI OR UBER OR ORTHERWISE, BUT AN ADULT MUST ACCOMPANY YOU HOME AND STAY WITH YOU FOR 24 HOURS.  Name and phone number of your driver:               Please  read over the following fact sheets you were given: _____________________________________________________________________             NO SOLID FOOD AFTER MIDNIGHT THE NIGHT PRIOR TO SURGERY. NOTHING BY MOUTH EXCEPT CLEAR LIQUIDS UNTIL 0430am . PLEASE FINISH ENSURE DRINK PER SURGEON ORDER  WHICH NEEDS TO BE COMPLETED AT 0430am .   CLEAR LIQUID DIET   Foods Allowed                                                                     Foods Excluded  Coffee and tea, regular and decaf  liquids that you cannot  Plain Jell-O any favor except red or purple                                           see through such as: Fruit ices (not with fruit pulp)                                     milk, soups, orange juice  Iced Popsicles                                    All solid food Carbonated beverages, regular and diet                                    Cranberry, grape and apple juices Sports drinks like Gatorade Lightly seasoned clear broth or consume(fat free) Sugar, honey syrup  Sample Menu Breakfast                                Lunch                                     Supper Cranberry juice                    Beef broth                            Chicken broth Jell-O                                     Grape juice                           Apple juice Coffee or tea                        Jell-O                                      Popsicle                                                Coffee or tea                        Coffee or tea  _____________________________________________________________________  [facility]- Preparing for Total Shoulder Arthroplasty    Before surgery, you can play an important role. Because skin is not sterile, your skin needs to be as free of germs as possible. You can reduce the number of germs on your skin by using the following products. . Benzoyl Peroxide Gel o Reduces the number of germs  present on the skin o Applied twice a  day to shoulder area starting two days before surgery    ==================================================================  Please follow these instructions carefully:  BENZOYL PEROXIDE 5% GEL  Please do not use if you have an allergy to benzoyl peroxide.   If your skin becomes reddened/irritated stop using the benzoyl peroxide.  Starting two days before surgery, apply as follows: 1. Apply benzoyl peroxide in the morning and at night. Apply after taking a shower. If you are not taking a shower clean entire shoulder front, back, and side along with the armpit with a clean wet washcloth.  2. Place a quarter-sized dollop on your shoulder and rub in thoroughly, making sure to cover the front, back, and side of your shoulder, along with the armpit.   2 days before ____ AM   ____ PM              1 day before ____ AM   ____ PM                         3. Do this twice a day for two days.  (Last application is the night before surgery, AFTER using the CHG soap as described below).  4. Do NOT apply benzoyl peroxide gel on the day of surgery.   Incentive Spirometer  An incentive spirometer is a tool that can help keep your lungs clear and active. This tool measures how well you are filling your lungs with each breath. Taking long deep breaths may help reverse or decrease the chance of developing breathing (pulmonary) problems (especially infection) following:  A long period of time when you are unable to move or be active. BEFORE THE PROCEDURE   If the spirometer includes an indicator to show your best effort, your nurse or respiratory therapist will set it to a desired goal.  If possible, sit up straight or lean slightly forward. Try not to slouch.  Hold the incentive spirometer in an upright position. INSTRUCTIONS FOR USE  1. Sit on the edge of your bed if possible, or sit up as far as you can in bed or on a chair. 2. Hold the incentive spirometer in an upright position. 3. Breathe out  normally. 4. Place the mouthpiece in your mouth and seal your lips tightly around it. 5. Breathe in slowly and as deeply as possible, raising the piston or the ball toward the top of the column. 6. Hold your breath for 3-5 seconds or for as long as possible. Allow the piston or ball to fall to the bottom of the column. 7. Remove the mouthpiece from your mouth and breathe out normally. 8. Rest for a few seconds and repeat Steps 1 through 7 at least 10 times every 1-2 hours when you are awake. Take your time and take a few normal breaths between deep breaths. 9. The spirometer may include an indicator to show your best effort. Use the indicator as a goal to work toward during each repetition. 10. After each set of 10 deep breaths, practice coughing to be sure your lungs are clear. If you have an incision (the cut made at the time of surgery), support your incision when coughing by placing a pillow or rolled up towels firmly against it. Once you are able to get out of bed, walk around indoors and cough well. You may stop using the incentive spirometer when instructed by your caregiver.  RISKS AND COMPLICATIONS  Take your  time so you do not get dizzy or light-headed.  If you are in pain, you may need to take or ask for pain medication before doing incentive spirometry. It is harder to take a deep breath if you are having pain. AFTER USE  Rest and breathe slowly and easily.  It can be helpful to keep track of a log of your progress. Your caregiver can provide you with a simple table to help with this. If you are using the spirometer at home, follow these instructions: [place] IF:   You are having difficultly using the spirometer.  You have trouble using the spirometer as often as instructed.  Your pain medication is not giving enough relief while using the spirometer.  You develop fever of 100.5 F (38.1 C) or higher. SEEK IMMEDIATE MEDICAL CARE IF:   You cough up bloody sputum  that had not been present before.  You develop fever of 102 F (38.9 C) or greater.  You develop worsening pain at or near the incision site. MAKE SURE YOU:   Understand these instructions.  Will watch your condition.  Will get help right away if you are not doing well or get worse. Document Released: 04/11/2007 Document Revised: 02/21/2012 Document Reviewed: 06/12/2007 ExitCare Patient Information 2014 [employer].   ________________________________________________________________________ [facility] - Preparing for Surgery Before surgery, you can play an important role.  Because skin is not sterile, your skin needs to be as free of germs as possible.  You can reduce the number of germs on your skin by washing with CHG (chlorahexidine gluconate) soap before surgery.  CHG is an antiseptic cleaner which kills germs and bonds with the skin to continue killing germs even after washing. Please DO NOT use if you have an allergy to CHG or antibacterial soaps.  If your skin becomes reddened/irritated stop using the CHG and inform your nurse when you arrive at Short Stay. Do not shave (including legs and underarms) for at least 48 hours prior to the first CHG shower.  You may shave your face/neck. Please follow these instructions carefully:  1.  Shower with CHG Soap the night before surgery and the  morning of Surgery.  2.  If you choose to wash your hair, wash your hair first as usual with your  normal  shampoo.  3.  After you shampoo, rinse your hair and body thoroughly to remove the  shampoo.                           4.  Use CHG as you would any other liquid soap.  You can apply chg directly  to the skin and wash                       Gently with a scrungie or clean washcloth.  5.  Apply the CHG Soap to your body ONLY FROM THE NECK DOWN.   Do not use on face/ open                           Wound or open sores. Avoid contact with eyes, ears mouth and genitals (private parts).                        Wash face,  Genitals (private parts) with your normal soap.  6.  Wash thoroughly, paying special attention to the area where your surgery  will be performed.  7.  Thoroughly rinse your body with warm water from the neck down.  8.  DO NOT shower/wash with your normal soap after using and rinsing off  the CHG Soap.                9.  Pat yourself dry with a clean towel.            10.  Wear clean pajamas.            11.  Place clean sheets on your bed the night of your first shower and do not  sleep with pets. Day of Surgery : Do not apply any lotions/deodorants the morning of surgery.  Please wear clean clothes to the hospital/surgery center.  FAILURE TO FOLLOW THESE INSTRUCTIONS MAY RESULT IN THE CANCELLATION OF YOUR SURGERY PATIENT SIGNATURE_________________________________  NURSE SIGNATURE__________________________________  ________________________________________________________________________

## 2020-05-13 NOTE — H&P (Signed)
Patient's anticipated LOS is less than 2 midnights, meeting these requirements: - Younger than 1 - Lives within 1 hour of care - Has a competent adult at home to recover with post-op recover - NO history of  - Chronic pain requiring opiods  - Diabetes  - Coronary Artery Disease  - Heart failure  - Heart attack  - Stroke  - DVT/VTE  - Cardiac arrhythmia  - Respiratory Failure/COPD  - Renal failure  - Anemia  - Advanced Liver disease       Lynn Johns is an 69 y.o. female.    Chief Complaint: right shoulder pain  HPI: Pt is a 69 y.o. female complaining of right shoulder pain for multiple years. Pain had continually increased since the beginning. X-rays in the clinic show end-stage arthritic changes of the right shoulder. Pt has tried various conservative treatments which have failed to alleviate their symptoms, including injections and therapy. Various options are discussed with the patient. Risks, benefits and expectations were discussed with the patient. Patient understand the risks, benefits and expectations and wishes to proceed with surgery.   PCP:  Maurice Small, MD  D/C Plans: Home  PMH: Past Medical History:  Diagnosis Date  . Arthritis    lumbar radiculopathy   . Cystocele   . Diverticulosis   . Hemorrhoids   . Hypertension   . Kidney disease   . PONV (postoperative nausea and vomiting)    needs motion sickness patch prior to surgery,pt. remarks that she had hypotension for 2 days after surgery, she thinks related to medicine from anesth., also reports allergy to VERSED & Fentanyl, damage to estachian tubes from prev. ET tube during lumbar fusion , myringotomy tubes in place since 06/2012  . Tubular adenoma of colon     PSH: Past Surgical History:  Procedure Laterality Date  . ABDOMINAL HYSTERECTOMY  July 2006   with bilateral salpingo-oophorectomy  . AIKEN OSTEOTOMY    . anterior lumbar decompression and arthrodesis     L5-S1  . ANTERIOR LUMBAR FUSION   05/19/2012   Procedure: ANTERIOR LUMBAR FUSION 1 LEVEL;  Surgeon: Kristeen Miss, MD;  Location: [facility];  Service: Neurosurgery;  Laterality: N/A;  Lumbar three-fourAnterior lumbar interbody fusion  . BUNIONECTOMY  06/04/14  . cervical disc removal  11-2011   C5 and C6  . COLONOSCOPY    . MICRODISCECTOMY LUMBAR  2010  . MYRINGOTOMY  06/2012   another set since then   . REPLACEMENT DISC ANTERIOR LUMBAR SPINE    . ROTATOR CUFF REPAIR Left 12/2010, 03/2011  . rotator cuff surgery Right 06/2010, 07/2010  . SHOULDER ARTHROSCOPY Right 05/28/2016   Procedure: RIGHT SHOULDER ARTHROSCOPY;  Surgeon: Netta Cedars, MD;  Location: [facility];  Service: Orthopedics;  Laterality: Right;  . STRABISMUS SURGERY Right 03/01/2014   Procedure: REPAIR STRABISMUS RIGHT EYE ;  Surgeon: Derry Skill, MD;  Location: [facility];  Service: Ophthalmology;  Laterality: Right;  . TUBAL LIGATION  1986    Social History:  reports that she has never smoked. She has never used smokeless tobacco. She reports current alcohol use of about 1.0 standard drinks of alcohol per week. She reports that she does not use drugs.  Allergies:  Allergies  Allergen Reactions  . Ciprofloxacin     Dr does not want her to take it do to the levaquin giving her tendonitis in her legs  . Clindamycin/Lincomycin Nausea And Vomiting  . Fentanyl     Rash, insomnia, uncontrollable  crying  . Levaquin [Levofloxacin] Other (See Comments)    Tendonitis - very bad reaction  . Nsaids     kidney  . Percocet [Oxycodone-Acetaminophen]     Pt is out of it   . Valium [Diazepam] Other (See Comments)    Makes her cry and be happy at the same time  . Versed [Midazolam]     Rash, insomnia, memory loss  . Penicillins Rash and Other (See Comments)    Vaginal infection     Medications: No current facility-administered medications for this encounter.   Current Outpatient Medications  Medication Sig Dispense Refill  . carvedilol (COREG CR) 10  MG 24 hr capsule Take 10 mg by mouth daily.    . Estradiol 10 MCG INST Place 10 mcg vaginally once a week.    . fexofenadine (ALLEGRA) 180 MG tablet Take 180 mg by mouth daily as needed for allergies.     Marland Kitchen losartan (COZAAR) 25 MG tablet Take 25 mg by mouth daily.    . methocarbamol (ROBAXIN) 500 MG tablet Take 1 tablet (500 mg total) by mouth 3 (three) times daily as needed. (Patient taking differently: Take 500 mg by mouth 3 (three) times daily as needed for muscle spasms. ) 60 tablet 1  . potassium chloride SA (K-DUR,KLOR-CON) 20 MEQ tablet Take 20 mEq by mouth daily.   11  . spironolactone (ALDACTONE) 50 MG tablet Take 50 mg by mouth daily.      No results found for this or any previous visit (from the past 48 hour(s)). No results found.  ROS: Pain with rom of the right upper extremity  Physical Exam: Alert and oriented 69 y.o. female in no acute distress Cranial nerves 2-12 intact Cervical spine: full rom with no tenderness, nv intact distally Chest: active breath sounds bilaterally, no wheeze rhonchi or rales Heart: regular rate and rhythm, no murmur Abd: non tender non distended with active bowel sounds Hip is stable with rom  Right shoulder with painful rom Weakness with ER and IR No rashes or edema distally  Assessment/Plan Assessment: right shoulder cuff arthropathy  Plan:  Patient will undergo a right reverse shoulder  by Dr. Veverly Fells at [facility] Risks benefits and expectations were discussed with the patient. Patient understand risks, benefits and expectations and wishes to proceed. Preoperative templating of the joint replacement has been completed, documented, and submitted to the Operating Room personnel in order to optimize intra-operative equipment management.   Merla Riches PA-C, [facility] is now [employer] [address], [place], [place], [place] [address] Phone: [phone] [email or web address]   [employer]

## 2020-05-19 NOTE — Progress Notes (Signed)
Patient would like to speak to Anesthesia prior to surgery on 05/23/20.  Informed patient that I would send a message to Anesthesia.  Patient has hx of hypotension with Anesthesia and multiple allergies.

## 2020-05-20 ENCOUNTER — Other Ambulatory Visit (HOSPITAL_COMMUNITY)
Admission: RE | Admit: 2020-05-20 | Discharge: 2020-05-20 | Disposition: A | Discharge status: Home / Self Care | Payer: BC Managed Care – PPO | Source: Ambulatory Visit | Attending: Orthopedic Surgery | Admitting: Orthopedic Surgery

## 2020-05-20 DIAGNOSIS — U071 COVID-19: Secondary | ICD-10-CM | POA: Insufficient documentation

## 2020-05-20 DIAGNOSIS — Z01812 Encounter for preprocedural laboratory examination: Secondary | ICD-10-CM | POA: Insufficient documentation

## 2020-05-20 LAB — SARS CORONAVIRUS 2 (TAT 6-24 HRS): SARS Coronavirus 2: POSITIVE — AB

## 2020-05-21 ENCOUNTER — Telehealth: Payer: Self-pay | Admitting: Nurse Practitioner

## 2020-05-21 NOTE — Progress Notes (Signed)
Holly from [facility] called and stated patient was covid positive on 05/20/20.  Eustace Pen, surgery Scheduler for Dr Veverly Fells and made aware.   LVMM for [place] with above information.

## 2020-05-21 NOTE — Telephone Encounter (Signed)
Called to Discuss with patient about Covid symptoms and the use of bamlanivimab, a monoclonal antibody infusion for those with mild to moderate Covid symptoms and at a high risk of hospitalization.     Pt is qualified for this infusion at the [facility] infusion center due to co-morbid conditions and/or a member of an at-risk group.     Patient Active Problem List   Diagnosis Date Noted  . Diverticulitis of colon with perforation 09/24/2016  . Degeneration of intervertebral disc, site unspecified 04/18/2012    Patient declines infusion at this time. Denies any symptoms. Symptoms tier reviewed as well as criteria for ending isolation. Preventative practices reviewed. Patient verbalized understanding.    Patient advised to call back if she does develop symptoms and decides that she does want to get infusion. Callback number to the infusion center given. Patient advised to go to Urgent care or ED with severe symptoms.

## 2020-05-21 NOTE — Progress Notes (Signed)
Patient tested positive for COVID on 05/20/20, patients surgery will need to be postponed for 10 days after positive COVID result.  Dr Veverly Fells office made aware

## 2020-05-22 NOTE — Progress Notes (Addendum)
Patient aware surgery now on 05/30/2020.  Patient aware to arrive at 0730am on 05/30/20.  Surgery time of 1002am.  Clear liquids until 0700am with ensure drink completion at 0700am.  Patient voices understanding.  Patient still has preop instructions along with hibiclens and ensure drink from previous instructions.  Labs of cbc,bmp and pcr screen am of surgery since labs normal on preop of 05/13/20.

## 2020-05-30 ENCOUNTER — Encounter (HOSPITAL_COMMUNITY): Payer: Self-pay | Admitting: Orthopedic Surgery

## 2020-05-30 ENCOUNTER — Ambulatory Visit (HOSPITAL_COMMUNITY): Payer: BC Managed Care – PPO | Admitting: Certified Registered Nurse Anesthetist

## 2020-05-30 ENCOUNTER — Observation Stay (HOSPITAL_COMMUNITY)
Admission: RE | Admit: 2020-05-30 | Discharge: 2020-05-31 | Disposition: A | Discharge status: Home / Self Care | Payer: BC Managed Care – PPO | Attending: Orthopedic Surgery | Admitting: Orthopedic Surgery

## 2020-05-30 ENCOUNTER — Observation Stay (HOSPITAL_COMMUNITY): Payer: BC Managed Care – PPO

## 2020-05-30 ENCOUNTER — Ambulatory Visit (HOSPITAL_COMMUNITY): Payer: BC Managed Care – PPO | Admitting: Physician Assistant

## 2020-05-30 ENCOUNTER — Encounter (HOSPITAL_COMMUNITY)
Admission: RE | Disposition: A | Discharge status: Home / Self Care | Payer: Self-pay | Source: Home / Self Care | Attending: Orthopedic Surgery

## 2020-05-30 ENCOUNTER — Other Ambulatory Visit: Payer: Self-pay

## 2020-05-30 DIAGNOSIS — M898X1 Other specified disorders of bone, shoulder: Secondary | ICD-10-CM | POA: Insufficient documentation

## 2020-05-30 DIAGNOSIS — M19011 Primary osteoarthritis, right shoulder: Secondary | ICD-10-CM | POA: Diagnosis not present

## 2020-05-30 DIAGNOSIS — E871 Hypo-osmolality and hyponatremia: Secondary | ICD-10-CM | POA: Insufficient documentation

## 2020-05-30 DIAGNOSIS — M25711 Osteophyte, right shoulder: Secondary | ICD-10-CM | POA: Insufficient documentation

## 2020-05-30 DIAGNOSIS — Z885 Allergy status to narcotic agent status: Secondary | ICD-10-CM | POA: Insufficient documentation

## 2020-05-30 DIAGNOSIS — Z79890 Hormone replacement therapy (postmenopausal): Secondary | ICD-10-CM | POA: Diagnosis not present

## 2020-05-30 DIAGNOSIS — Z886 Allergy status to analgesic agent status: Secondary | ICD-10-CM | POA: Insufficient documentation

## 2020-05-30 DIAGNOSIS — Z79899 Other long term (current) drug therapy: Secondary | ICD-10-CM | POA: Insufficient documentation

## 2020-05-30 DIAGNOSIS — I10 Essential (primary) hypertension: Secondary | ICD-10-CM | POA: Diagnosis not present

## 2020-05-30 DIAGNOSIS — Z881 Allergy status to other antibiotic agents status: Secondary | ICD-10-CM | POA: Insufficient documentation

## 2020-05-30 DIAGNOSIS — S42124A Nondisplaced fracture of acromial process, right shoulder, initial encounter for closed fracture: Secondary | ICD-10-CM | POA: Diagnosis not present

## 2020-05-30 DIAGNOSIS — M75121 Complete rotator cuff tear or rupture of right shoulder, not specified as traumatic: Secondary | ICD-10-CM | POA: Diagnosis not present

## 2020-05-30 DIAGNOSIS — Z888 Allergy status to other drugs, medicaments and biological substances status: Secondary | ICD-10-CM | POA: Diagnosis not present

## 2020-05-30 DIAGNOSIS — Z96611 Presence of right artificial shoulder joint: Secondary | ICD-10-CM | POA: Diagnosis not present

## 2020-05-30 DIAGNOSIS — M12811 Other specific arthropathies, not elsewhere classified, right shoulder: Secondary | ICD-10-CM | POA: Diagnosis not present

## 2020-05-30 DIAGNOSIS — Z471 Aftercare following joint replacement surgery: Secondary | ICD-10-CM | POA: Diagnosis not present

## 2020-05-30 DIAGNOSIS — Z880 Allergy status to penicillin: Secondary | ICD-10-CM | POA: Diagnosis not present

## 2020-05-30 HISTORY — PX: REVERSE SHOULDER ARTHROPLASTY: SHX5054

## 2020-05-30 SURGERY — ARTHROPLASTY, SHOULDER, TOTAL, REVERSE
Anesthesia: General | Site: Shoulder | Laterality: Right

## 2020-05-30 MED ORDER — BUPIVACAINE-EPINEPHRINE (PF) 0.5% -1:200000 IJ SOLN
INTRAMUSCULAR | Status: AC
  Filled 2020-05-30: qty 30

## 2020-05-30 MED ORDER — SODIUM CHLORIDE 0.9 % IV SOLN: INTRAVENOUS | Status: DC

## 2020-05-30 MED ORDER — METOCLOPRAMIDE HCL 5 MG/ML IJ SOLN: 5.0000 mg | Freq: Three times a day (TID) | INTRAMUSCULAR | Status: DC | PRN

## 2020-05-30 MED ORDER — BUPIVACAINE-EPINEPHRINE (PF) 0.25% -1:200000 IJ SOLN
INTRAMUSCULAR | Status: DC | PRN
  Administered 2020-05-30: 30 mL

## 2020-05-30 MED ORDER — ACETAMINOPHEN 325 MG PO TABS: 325.0000 mg | ORAL_TABLET | Freq: Four times a day (QID) | ORAL | Status: DC | PRN

## 2020-05-30 MED ORDER — LOSARTAN POTASSIUM 25 MG PO TABS
25.0000 mg | ORAL_TABLET | Freq: Every day | ORAL | Status: DC
  Administered 2020-05-31: 25 mg via ORAL
  Filled 2020-05-30: qty 1

## 2020-05-30 MED ORDER — CARVEDILOL PHOSPHATE ER 10 MG PO CP24
10.0000 mg | ORAL_CAPSULE | Freq: Every day | ORAL | Status: DC
  Administered 2020-05-31: 10 mg via ORAL
  Filled 2020-05-30: qty 1

## 2020-05-30 MED ORDER — CEFAZOLIN SODIUM-DEXTROSE 2-4 GM/100ML-% IV SOLN
2.0000 g | Freq: Four times a day (QID) | INTRAVENOUS | Status: AC
  Administered 2020-05-30 – 2020-05-31 (×3): 2 g via INTRAVENOUS
  Filled 2020-05-30 (×3): qty 100

## 2020-05-30 MED ORDER — ONDANSETRON HCL 4 MG/2ML IJ SOLN
INTRAMUSCULAR | Status: DC | PRN
  Administered 2020-05-30: 4 mg via INTRAVENOUS

## 2020-05-30 MED ORDER — MIDAZOLAM HCL 2 MG/2ML IJ SOLN
1.0000 mg | INTRAMUSCULAR | Status: DC
  Filled 2020-05-30: qty 2

## 2020-05-30 MED ORDER — MORPHINE SULFATE (PF) 2 MG/ML IV SOLN: 0.5000 mg | INTRAVENOUS | Status: DC | PRN

## 2020-05-30 MED ORDER — METHOCARBAMOL 500 MG PO TABS: 500.0000 mg | ORAL_TABLET | Freq: Three times a day (TID) | ORAL | Status: DC | PRN

## 2020-05-30 MED ORDER — ONDANSETRON HCL 4 MG/2ML IJ SOLN: 4.0000 mg | Freq: Four times a day (QID) | INTRAMUSCULAR | Status: DC | PRN

## 2020-05-30 MED ORDER — POLYETHYLENE GLYCOL 3350 17 G PO PACK: 17.0000 g | PACK | Freq: Every day | ORAL | Status: DC | PRN

## 2020-05-30 MED ORDER — EPHEDRINE SULFATE-NACL 50-0.9 MG/10ML-% IV SOSY
PREFILLED_SYRINGE | INTRAVENOUS | Status: DC | PRN
  Administered 2020-05-30: 10 mg via INTRAVENOUS
  Administered 2020-05-30: 5 mg via INTRAVENOUS

## 2020-05-30 MED ORDER — FENTANYL CITRATE (PF) 100 MCG/2ML IJ SOLN
50.0000 ug | INTRAMUSCULAR | Status: DC
  Filled 2020-05-30: qty 2

## 2020-05-30 MED ORDER — HYDROCODONE-ACETAMINOPHEN 7.5-325 MG PO TABS
1.0000 | ORAL_TABLET | ORAL | Status: DC | PRN
  Administered 2020-05-31: 1 via ORAL
  Filled 2020-05-30: qty 1

## 2020-05-30 MED ORDER — LIDOCAINE 2% (20 MG/ML) 5 ML SYRINGE
INTRAMUSCULAR | Status: DC | PRN
  Administered 2020-05-30: 60 mg via INTRAVENOUS

## 2020-05-30 MED ORDER — HYDROCODONE-ACETAMINOPHEN 5-325 MG PO TABS
1.0000 | ORAL_TABLET | ORAL | Status: DC | PRN
  Administered 2020-05-30 (×2): 1 via ORAL
  Filled 2020-05-30 (×3): qty 1

## 2020-05-30 MED ORDER — ONDANSETRON HCL 4 MG/2ML IJ SOLN: 4.0000 mg | Freq: Once | INTRAMUSCULAR | Status: DC | PRN

## 2020-05-30 MED ORDER — DOCUSATE SODIUM 100 MG PO CAPS
100.0000 mg | ORAL_CAPSULE | Freq: Two times a day (BID) | ORAL | Status: DC
  Administered 2020-05-30: 100 mg via ORAL
  Filled 2020-05-30 (×3): qty 1

## 2020-05-30 MED ORDER — METOCLOPRAMIDE HCL 5 MG PO TABS: 5.0000 mg | ORAL_TABLET | Freq: Three times a day (TID) | ORAL | Status: DC | PRN

## 2020-05-30 MED ORDER — ROCURONIUM BROMIDE 10 MG/ML (PF) SYRINGE
PREFILLED_SYRINGE | INTRAVENOUS | Status: DC | PRN
  Administered 2020-05-30: 10 mg via INTRAVENOUS
  Administered 2020-05-30: 70 mg via INTRAVENOUS

## 2020-05-30 MED ORDER — PHENYLEPHRINE HCL-NACL 20-0.9 MG/250ML-% IV SOLN
INTRAVENOUS | Status: DC | PRN
  Administered 2020-05-30: 40 ug/min via INTRAVENOUS

## 2020-05-30 MED ORDER — SUFENTANIL CITRATE 50 MCG/ML IV SOLN
INTRAVENOUS | Status: DC | PRN
  Administered 2020-05-30: 20 ug via INTRAVENOUS
  Administered 2020-05-30: 5 ug via INTRAVENOUS
  Administered 2020-05-30: 15 ug via INTRAVENOUS
  Administered 2020-05-30: 5 ug via INTRAVENOUS

## 2020-05-30 MED ORDER — MORPHINE SULFATE (PF) 4 MG/ML IV SOLN: 1.0000 mg | INTRAVENOUS | Status: DC | PRN

## 2020-05-30 MED ORDER — PROPOFOL 10 MG/ML IV BOLUS
INTRAVENOUS | Status: DC | PRN
  Administered 2020-05-30: 160 mg via INTRAVENOUS

## 2020-05-30 MED ORDER — ORAL CARE MOUTH RINSE: 15.0000 mL | Freq: Once | OROMUCOSAL | Status: DC

## 2020-05-30 MED ORDER — POTASSIUM CHLORIDE CRYS ER 20 MEQ PO TBCR
20.0000 meq | EXTENDED_RELEASE_TABLET | Freq: Every day | ORAL | Status: DC
  Administered 2020-05-31: 20 meq via ORAL
  Filled 2020-05-30: qty 1

## 2020-05-30 MED ORDER — ORAL CARE MOUTH RINSE: 15.0000 mL | Freq: Once | OROMUCOSAL | Status: AC

## 2020-05-30 MED ORDER — ESTRADIOL 10 MCG VA INST: 10.0000 ug | VAGINAL_INSERT | VAGINAL | Status: DC

## 2020-05-30 MED ORDER — CEFAZOLIN SODIUM-DEXTROSE 2-4 GM/100ML-% IV SOLN
2.0000 g | INTRAVENOUS | Status: AC
  Administered 2020-05-30: 2 g via INTRAVENOUS
  Filled 2020-05-30: qty 100

## 2020-05-30 MED ORDER — 0.9 % SODIUM CHLORIDE (POUR BTL) OPTIME
TOPICAL | Status: DC | PRN
  Administered 2020-05-30: 1000 mL

## 2020-05-30 MED ORDER — LORATADINE 10 MG PO TABS
10.0000 mg | ORAL_TABLET | Freq: Every day | ORAL | Status: DC
  Filled 2020-05-30: qty 1

## 2020-05-30 MED ORDER — CHLORHEXIDINE GLUCONATE 0.12 % MT SOLN
15.0000 mL | Freq: Once | OROMUCOSAL | Status: AC
  Administered 2020-05-30: 15 mL via OROMUCOSAL

## 2020-05-30 MED ORDER — CHLORHEXIDINE GLUCONATE 0.12 % MT SOLN: 15.0000 mL | Freq: Once | OROMUCOSAL | Status: DC

## 2020-05-30 MED ORDER — SODIUM CHLORIDE 0.9 % IR SOLN
Status: DC | PRN
  Administered 2020-05-30: 1000 mL

## 2020-05-30 MED ORDER — BISACODYL 10 MG RE SUPP: 10.0000 mg | Freq: Every day | RECTAL | Status: DC | PRN

## 2020-05-30 MED ORDER — METHOCARBAMOL 500 MG IVPB - SIMPLE MED
500.0000 mg | Freq: Four times a day (QID) | INTRAVENOUS | Status: DC | PRN
  Filled 2020-05-30: qty 50

## 2020-05-30 MED ORDER — HYDROCODONE-ACETAMINOPHEN 5-325 MG PO TABS: 1.0000 | ORAL_TABLET | Freq: Four times a day (QID) | ORAL | 0 refills | Status: DC | PRN

## 2020-05-30 MED ORDER — SUGAMMADEX SODIUM 200 MG/2ML IV SOLN
INTRAVENOUS | Status: DC | PRN
  Administered 2020-05-30: 150 mg via INTRAVENOUS

## 2020-05-30 MED ORDER — ONDANSETRON HCL 4 MG PO TABS
4.0000 mg | ORAL_TABLET | Freq: Four times a day (QID) | ORAL | Status: DC | PRN
  Administered 2020-05-30 – 2020-05-31 (×2): 4 mg via ORAL
  Filled 2020-05-30 (×2): qty 1

## 2020-05-30 MED ORDER — PHENOL 1.4 % MT LIQD: 1.0000 | OROMUCOSAL | Status: DC | PRN

## 2020-05-30 MED ORDER — SPIRONOLACTONE 25 MG PO TABS
50.0000 mg | ORAL_TABLET | Freq: Every day | ORAL | Status: DC
  Administered 2020-05-31: 50 mg via ORAL
  Filled 2020-05-30 (×2): qty 2

## 2020-05-30 MED ORDER — METHOCARBAMOL 500 MG PO TABS: 500.0000 mg | ORAL_TABLET | Freq: Three times a day (TID) | ORAL | 1 refills | Status: AC | PRN

## 2020-05-30 MED ORDER — STERILE WATER FOR IRRIGATION IR SOLN
Status: DC | PRN
  Administered 2020-05-30: 2000 mL

## 2020-05-30 MED ORDER — LACTATED RINGERS IV SOLN: INTRAVENOUS | Status: DC

## 2020-05-30 MED ORDER — METHOCARBAMOL 500 MG PO TABS
500.0000 mg | ORAL_TABLET | Freq: Four times a day (QID) | ORAL | Status: DC | PRN
  Administered 2020-05-30 – 2020-05-31 (×3): 500 mg via ORAL
  Filled 2020-05-30 (×3): qty 1

## 2020-05-30 MED ORDER — MENTHOL 3 MG MT LOZG: 1.0000 | LOZENGE | OROMUCOSAL | Status: DC | PRN

## 2020-05-30 MED ORDER — PHENYLEPHRINE 40 MCG/ML (10ML) SYRINGE FOR IV PUSH (FOR BLOOD PRESSURE SUPPORT)
PREFILLED_SYRINGE | INTRAVENOUS | Status: DC | PRN
  Administered 2020-05-30 (×2): 80 ug via INTRAVENOUS
  Administered 2020-05-30: 120 ug via INTRAVENOUS

## 2020-05-30 SURGICAL SUPPLY — 89 items
BAG SPEC THK2 15X12 ZIP CLS (MISCELLANEOUS)
BAG ZIPLOCK 12X15 (MISCELLANEOUS) IMPLANT
BIT DRILL 1.6MX128 (BIT) IMPLANT
BIT DRILL 170X2.5X (BIT) ×1 IMPLANT
BIT DRILL 2.0 LNG QUCK RELEASE (BIT) ×2 IMPLANT
BIT DRILL 2.8X5 QR DISP (BIT) ×2 IMPLANT
BIT DRL 170X2.5X (BIT) ×1
BLADE MIC 41X13 (BLADE) ×2 IMPLANT
BLADE SAG 18X100X1.27 (BLADE) ×2 IMPLANT
BLADE SAW SGTL 81X20 HD (BLADE) ×2 IMPLANT
BUR OVAL CARBIDE 4.0 (BURR) ×2 IMPLANT
CLSR STERI-STRIP ANTIMIC 1/2X4 (GAUZE/BANDAGES/DRESSINGS) ×2 IMPLANT
COVER BACK TABLE 60X90IN (DRAPES) ×2 IMPLANT
COVER SURGICAL LIGHT HANDLE (MISCELLANEOUS) ×2 IMPLANT
COVER WAND RF STERILE (DRAPES) IMPLANT
DECANTER SPIKE VIAL GLASS SM (MISCELLANEOUS) ×2 IMPLANT
DRAPE INCISE IOBAN 66X45 STRL (DRAPES) ×2 IMPLANT
DRAPE ORTHO SPLIT 77X108 STRL (DRAPES) ×4
DRAPE SHEET LG 3/4 BI-LAMINATE (DRAPES) ×2 IMPLANT
DRAPE SURG ORHT 6 SPLT 77X108 (DRAPES) ×2 IMPLANT
DRAPE U-SHAPE 47X51 STRL (DRAPES) ×2 IMPLANT
DRILL 2.0 LNG QUICK RELEASE (BIT) ×4
DRILL 2.5 (BIT) ×2
DRSG ADAPTIC 3X8 NADH LF (GAUZE/BANDAGES/DRESSINGS) ×2 IMPLANT
DRSG PAD ABDOMINAL 8X10 ST (GAUZE/BANDAGES/DRESSINGS) ×2 IMPLANT
DURAPREP 26ML APPLICATOR (WOUND CARE) ×2 IMPLANT
ECCENTRIC EPIPHYSI MODULAR SZ1 (Trauma) ×1 IMPLANT
ELECT BLADE TIP CTD 4 INCH (ELECTRODE) ×2 IMPLANT
ELECT NEEDLE TIP 2.8 STRL (NEEDLE) ×2 IMPLANT
ELECT REM PT RETURN 15FT ADLT (MISCELLANEOUS) ×2 IMPLANT
GAUZE SPONGE 4X4 12PLY STRL (GAUZE/BANDAGES/DRESSINGS) ×2 IMPLANT
GLENOSPHERE DELTA XTEND LAT 38 (Miscellaneous) ×2 IMPLANT
GLOVE BIOGEL PI ORTHO PRO 7.5 (GLOVE) ×1
GLOVE BIOGEL PI ORTHO PRO SZ8 (GLOVE) ×1
GLOVE ORTHO TXT STRL SZ7.5 (GLOVE) ×2 IMPLANT
GLOVE PI ORTHO PRO STRL 7.5 (GLOVE) ×1 IMPLANT
GLOVE PI ORTHO PRO STRL SZ8 (GLOVE) ×1 IMPLANT
GLOVE SURG ORTHO 8.5 STRL (GLOVE) ×2 IMPLANT
GOWN STRL REUS W/TWL XL LVL3 (GOWN DISPOSABLE) ×4 IMPLANT
GUIDEWIRE ORTHO .059X5 (WIRE) ×2 IMPLANT
KIT BASIN (CUSTOM PROCEDURE TRAY) ×2 IMPLANT
KIT TURNOVER KIT A (KITS) IMPLANT
MANIFOLD NEPTUNE II (INSTRUMENTS) ×2 IMPLANT
METAGLENE DELTA EXTEND (Trauma) ×1 IMPLANT
METAGLENE DXTEND (Trauma) ×2 IMPLANT
MODULAR ECCENTRIC EPIPHYSI SZ1 (Trauma) ×2 IMPLANT
NEEDLE MAYO 6 CRC TAPER PT (NEEDLE) ×2 IMPLANT
NEEDLE MAYO CATGUT SZ4 (NEEDLE) ×2 IMPLANT
NS IRRIG 1000ML POUR BTL (IV SOLUTION) ×2 IMPLANT
PACK SHOULDER (CUSTOM PROCEDURE TRAY) ×2 IMPLANT
PENCIL SMOKE EVACUATOR (MISCELLANEOUS) IMPLANT
PIN GUIDE 1.2 (PIN) ×2 IMPLANT
PIN GUIDE GLENOPHERE 1.5MX300M (PIN) ×2 IMPLANT
PIN METAGLENE 2.5 (PIN) ×2 IMPLANT
PLATE RIGHT DISTAL CLAVICLE (Plate) ×2 IMPLANT
PROTECTOR NERVE ULNAR (MISCELLANEOUS) IMPLANT
RESTRAINT HEAD UNIVERSAL NS (MISCELLANEOUS) ×2 IMPLANT
SCREW 2.3X12MM (Screw) ×2 IMPLANT
SCREW 4.5X36MM (Screw) ×2 IMPLANT
SCREW 48L (Screw) ×2 IMPLANT
SCREW CORTICAL 2.3X14 (Screw) ×6 IMPLANT
SCREW CORTICAL LOCKING 2.3X14M (Screw) ×4 IMPLANT
SCREW HEXALOBE NON-LOCK 3.5X16 (Screw) ×2 IMPLANT
SCREW NON LOCKING HEX 3.5X22 (Screw) ×2 IMPLANT
SCREW NON TOGG 2.3X16MM (Screw) ×2 IMPLANT
SCREW NON TOGG 2.3X18MM (Screw) ×2 IMPLANT
SCREW NONLOCK HEX 3.5X12 (Screw) ×2 IMPLANT
SLING ARM FOAM STRAP LRG (SOFTGOODS) ×2 IMPLANT
SMARTMIX MINI TOWER (MISCELLANEOUS)
SPACER 38 PLUS 3 (Spacer) ×2 IMPLANT
SPONGE LAP 4X18 RFD (DISPOSABLE) IMPLANT
STEM HUMERAL SZ8 STANDARD (Stem) ×2 IMPLANT
STEM HUMERAL SZ8 STD (Stem) ×1 IMPLANT
STRIP CLOSURE SKIN 1/2X4 (GAUZE/BANDAGES/DRESSINGS) ×2 IMPLANT
SUCTION FRAZIER HANDLE 10FR (MISCELLANEOUS) ×2
SUCTION TUBE FRAZIER 10FR DISP (MISCELLANEOUS) ×1 IMPLANT
SUT FIBERWIRE #2 38 T-5 BLUE (SUTURE) ×4
SUT MNCRL AB 4-0 PS2 18 (SUTURE) ×2 IMPLANT
SUT VIC AB 0 CT1 36 (SUTURE) ×6 IMPLANT
SUT VIC AB 0 CT2 27 (SUTURE) ×2 IMPLANT
SUT VIC AB 2-0 CT1 27 (SUTURE) ×2
SUT VIC AB 2-0 CT1 TAPERPNT 27 (SUTURE) ×1 IMPLANT
SUTURE FIBERWR #2 38 T-5 BLUE (SUTURE) ×2 IMPLANT
TAP SCREW 2.3 (TAP) ×2
TAP SCREW 2.3 DIST FRAG (TAP) ×1 IMPLANT
TISSUE GRFT STRUT 20X200 STRL (Bone Implant) ×2 IMPLANT
TOWEL OR 17X26 10 PK STRL BLUE (TOWEL DISPOSABLE) ×4 IMPLANT
TOWER SMARTMIX MINI (MISCELLANEOUS) IMPLANT
YANKAUER SUCT BULB TIP 10FT TU (MISCELLANEOUS) ×2 IMPLANT

## 2020-05-30 NOTE — Discharge Instructions (Signed)
Ice to the shoulder constantly.  Keep the incision covered and clean and dry for one week, then ok to get it wet in the shower. ° °Do exercise as instructed several times per day. ° °DO NOT reach behind your back or push up out of a chair with the operative arm. ° °Use a sling while you are up and around for comfort, may remove while seated.  Keep pillow propped behind the operative elbow. ° °Follow up with Dr Kunio Cummiskey in two weeks in the office, call [phone] for appt °

## 2020-05-30 NOTE — Anesthesia Postprocedure Evaluation (Signed)
Anesthesia Post Note  Patient: Lynn Johns  Procedure(s) Performed: REVERSE SHOULDER ARTHROPLASTY Acromion ORIF with allograft (Right Shoulder)     Patient location during evaluation: PACU Anesthesia Type: General Level of consciousness: awake and alert Pain management: pain level controlled Vital Signs Assessment: post-procedure vital signs reviewed and stable Respiratory status: spontaneous breathing, nonlabored ventilation, respiratory function stable and patient connected to nasal cannula oxygen Cardiovascular status: blood pressure returned to baseline and stable Postop Assessment: no apparent nausea or vomiting Anesthetic complications: no   No complications documented.  Last Vitals:  Vitals:   05/30/20 1345 05/30/20 1400  BP: 113/60 108/72  Pulse: 71 66  Resp: 14 13  Temp:  36.6 C  SpO2: 100% 100%    Last Pain:  Vitals:   05/30/20 1400  TempSrc:   PainSc: 4                  Erven Ramson DAVID

## 2020-05-30 NOTE — Progress Notes (Signed)
Infection control called regarding the positive COVID test on 05/18/20. Infection control advised that the pt does not require contact precautions d/t the pt being asymptomatic.

## 2020-05-30 NOTE — Brief Op Note (Signed)
05/30/2020  1:00 PM  PATIENT:  Lynn Johns  69 y.o. female  PRE-OPERATIVE DIAGNOSIS:  Right shoulder rotator cuff arthropathy and acromial thinning/impending fracture  POST-OPERATIVE DIAGNOSIS:  Right shoulder rotator cuff arthropathy and acromial thinning/impending fracture  PROCEDURE:  Procedure(s) with comments: REVERSE SHOULDER ARTHROPLASTY Acromion ORIF with allograft (Right) - needs 150 minutes  SURGEON:  Surgeon(s) and Role:    Netta Cedars, MD - Primary  PHYSICIAN ASSISTANT:   ASSISTANTS: Ventura Bruns, PA-C   ANESTHESIA:   regional and general  EBL:  75 mL   BLOOD ADMINISTERED:none  DRAINS: none   LOCAL MEDICATIONS USED:  MARCAINE     SPECIMEN:  No Specimen  DISPOSITION OF SPECIMEN:  N/A  COUNTS:  YES  TOURNIQUET:  * No tourniquets in log *  DICTATION: .Other Dictation: Dictation Number [id number]  PLAN OF CARE: Admit for overnight observation  PATIENT DISPOSITION:  PACU - hemodynamically stable.   Delay start of Pharmacological VTE agent (>24hrs) due to surgical blood loss or risk of bleeding: not applicable

## 2020-05-30 NOTE — Anesthesia Preprocedure Evaluation (Signed)
Anesthesia Evaluation  Patient identified by MRN, date of birth, ID band Patient awake    Reviewed: Allergy & Precautions, NPO status , Patient's Chart, lab work & pertinent test results  History of Anesthesia Complications (+) PONV  Airway Mallampati: I  TM Distance: >3 FB Neck ROM: Full    Dental   Pulmonary    Pulmonary exam normal        Cardiovascular hypertension, Pt. on medications Normal cardiovascular exam     Neuro/Psych    GI/Hepatic   Endo/Other    Renal/GU      Musculoskeletal   Abdominal   Peds  Hematology   Anesthesia Other Findings   Reproductive/Obstetrics                             Anesthesia Physical Anesthesia Plan  ASA: III  Anesthesia Plan: General   Post-op Pain Management:    Induction: Intravenous  PONV Risk Score and Plan: 4 or greater and Ondansetron and Treatment may vary due to age or medical condition  Airway Management Planned: Oral ETT  Additional Equipment:   Intra-op Plan:   Post-operative Plan: Extubation in OR  Informed Consent: I have reviewed the patients History and Physical, chart, labs and discussed the procedure including the risks, benefits and alternatives for the proposed anesthesia with the patient or authorized representative who has indicated his/her understanding and acceptance.       Plan Discussed with: CRNA and Surgeon  Anesthesia Plan Comments: (Pt did not want a block. Will agree to Sufentanil. Offered a block in PACU if requested.)        Anesthesia Quick Evaluation

## 2020-05-30 NOTE — Anesthesia Procedure Notes (Signed)
Procedure Name: Intubation Performed by: Gean Maidens, CRNA Pre-anesthesia Checklist: Patient identified, Emergency Drugs available, Suction available, Patient being monitored and Timeout performed Patient Re-evaluated:Patient Re-evaluated prior to induction Oxygen Delivery Method: Circle system utilized Preoxygenation: Pre-oxygenation with 100% oxygen Induction Type: IV induction Ventilation: Mask ventilation without difficulty Laryngoscope Size: Mac and 3 Grade View: Grade I Tube type: Oral Tube size: 7.0 mm Number of attempts: 1 Airway Equipment and Method: Stylet Placement Confirmation: ETT inserted through vocal cords under direct vision,  positive ETCO2 and breath sounds checked- equal and bilateral Secured at: 21 cm Tube secured with: Tape Dental Injury: Teeth and Oropharynx as per pre-operative assessment

## 2020-05-30 NOTE — Interval H&P Note (Signed)
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  The various methods of treatment have been discussed with the patient and family. After consideration of risks, benefits and other options for treatment, the patient has consented to  Procedure(s) with comments: REVERSE SHOULDER ARTHROPLASTY Acromion ORIF with allograft (Right) - needs 150 minutes as a surgical intervention.  The patient's history has been reviewed, patient examined, no change in status, stable for surgery.  I have reviewed the patient's chart and labs.  Questions were answered to the patient's satisfaction.     Lynn Johns

## 2020-05-30 NOTE — Transfer of Care (Signed)
Immediate Anesthesia Transfer of Care Note  Patient: Lynn Johns  Procedure(s) Performed: REVERSE SHOULDER ARTHROPLASTY Acromion ORIF with allograft (Right Shoulder)  Patient Location: PACU  Anesthesia Type:General  Level of Consciousness: awake, alert  and oriented  Airway & Oxygen Therapy: Patient Spontanous Breathing and Patient connected to face mask oxygen  Post-op Assessment: Report given to RN and Post -op Vital signs reviewed and stable  Post vital signs: Reviewed and stable  Last Vitals:  Vitals Value Taken Time  BP 116/73 05/30/20 1313  Temp    Pulse 91 05/30/20 1315  Resp 15 05/30/20 1315  SpO2 100 % 05/30/20 1315  Vitals shown include unvalidated device data.  Last Pain:  Vitals:   05/30/20 0756  TempSrc: Oral  PainSc:       Patients Stated Pain Goal: 4 (22/56/72 0919)  Complications: No complications documented.

## 2020-05-30 NOTE — Plan of Care (Signed)
Plan of care reviewed and discussed with the patient. 

## 2020-05-30 NOTE — Op Note (Signed)
Lynn Johns, Lynn Johns MEDICAL RECORD [id number] ACCOUNT [id number] DATE OF BIRTH:1951-07-15 FACILITY: [facility] LOCATION: [facility] PHYSICIAN:STEVEN Orlena Sheldon, MD  OPERATIVE REPORT  DATE OF PROCEDURE:  05/30/2020  PREOPERATIVE DIAGNOSIS:  Right shoulder rotator cuff tear arthropathy with acromial thinning/impending acromion fracture.  POSTOPERATIVE DIAGNOSIS:  Right shoulder rotator cuff tear arthropathy with acromial thinning/impending acromion fracture.  PROCEDURE PERFORMED:  Right reverse total shoulder replacement using DePuy Delta Xtend prosthesis with open reduction internal fixation of right acromion with allograft bone grafting using femoral strut graft.  ATTENDING SURGEON:  Esmond Plants, MD   ASSISTANT:  Darol Destine, [place], who was scrubbed during the entire procedure and necessary for satisfactory completion of surgery.  ANESTHESIA:  General anesthesia was used plus local.  ESTIMATED BLOOD LOSS:  Minimal, less than 100 mL.  FLUID REPLACEMENT:  1500 mL crystalloid.  INSTRUMENT COUNTS:  Correct.  COMPLICATIONS:  No complications.  ANTIBIOTICS:  Perioperative antibiotics were given.  INDICATIONS:  The patient is a 69 year old female with a history of prior multiple right shoulder surgeries.  The patient presents with rotator cuff tear arthropathy.  The patient has superior head migration on AP x-rays and unfortunately has significant  thinning of her acromion with impending fracture.  We discussed options for management and given that she is rapidly eroding or wearing out her acromion we elected to proceed with reverse shoulder replacement to restore fixed focal mechanics to the  shoulder with essentially a prophylactic open reduction internal fixation of acromion with bone grafting of the underside of the acromion with femoral strut graft.  Risks and benefits of surgical management discussed in detail with the patient.  Informed  consent obtained.  DESCRIPTION OF  PROCEDURE:  After an adequate level of anesthesia was achieved, the patient was positioned in the modified beach chair position.  Right shoulder correctly identified and sterilely prepped and draped in the usual manner.  Time-out called,  verifying correct patient, correct site.  We entered the patient's shoulder using a deltopectoral incision starting at the coracoid process extending down to the anterior humerus.  Dissection down through subcutaneous tissues using Bovie.  We identified  cephalic vein, took that laterally with the deltoid, pectoralis taken medially.  Conjoined tendon identified and retracted medially.  We had identified the subscapularis and released that subperiosteally off the lesser tuberosity tagging for repair.  We  released the inferior capsule extending the shoulder and delivering the humeral head out of the wound.  We did resect the supraspinatus and infraspinatus tendons.  The repairs were intact.  The muscles were nonfunctional.  Thus, we removed that tissue.   We then entered the proximal humerus with a 6 mm reamer, reaming up to a size 8.  We could not get the tendon down.  We went ahead and placed our 8 mm intramedullary guide and resected the humeral head at 10 degrees of retroversion.  We cut that head for  bone grafting.  We removed excess osteophytes with a rongeur.  We then went to the acromion.  We made a lazy S-type incision over the posterior acromial scapular spine and up over the acromion down to the bone.  We did subperiosteal dissection of the  acromion and then split the deltoid between the anterior head and lateral head with a Bovie.  We were able to gain good exposure of the acromion.  We scraped the underside of the acromion to get to bleeding bone with a curette and with a Cobb elevator.   Once we got that  bleeding we did not want to use a bur to remove any additional bone.  We were able to fashion a graft that fit on the underside of the acromion from the  anterior edge of the acromion back to the scapular spine and that was made out of a  femoral strut graft.  We got that decorticated, so that it would be able to be incorporated by the patient's acromion.  We then used that as a backing plate essentially on the underside of the acromion.  We placed an appropriate sized Acumed distal  clavicle plate with a cluster of smaller screws of 2.4 screws distally and 3.5 screws more proximally.  We clamped the whole construct together and plate on top, her acromion and the middle, and then the strut graft on the underside.  We had good bony  compression.  We then placed 3.5 screws more proximally on the scapular spine and then 2.4 screws into the construct anteriorly.  We had a total of 8 screws, the 2.4 screws and we had to the cortical strut graft because it was very hard, but we had 8  good screws the first nonlocked for compression, and then locking screws for the remaining 5 or 6 and then we used additional 3.5 screws as necessary for the remainder of the plate.  We had excellent stability of the construct.  Good bony apposition and  at this point, we irrigated thoroughly, repaired the deltoid back to bone with a drill hole, and also to itself.  We had an anatomic deltoid repair.  We irrigated well.  We then went to the replacement.  We retracted the humerus posteriorly, gaining good  exposure of the glenoid face.  We removed the capsule and the labrum and the remaining rotator cuff tissue.  We placed our central guide pin, reamed for the metaglene baseplate, drilled out our central peg hole, impacted the metaglene into position.  We  used a 48 screw inferiorly and then a 36 screw proximally.  We could not get anterior and posterior screws because her glenoid was so small and those were potentially going to damage the bony support.  We did have good baseplate secured, we selected a  38 standard glenosphere and screwed that down onto the metaglene baseplate.  We  then addressed the humeral side.  We used our reamer to ream for the 1 right metaphysis and then selected the 8 stem, 1 right, set on the 0 setting and impacted in 10 degrees  of retroversion.  We placed a 38+3 standard poly and then reduced the shoulder.  We were pleased with our soft tissue balancing and stability.  We removed the trial components, irrigated thoroughly, drilled holes in lesser tuberosity, placed #2  FiberWire suture for repair of the subscap, and then used available bone graft and impaction grafting technique with the Porocoat stem and the HA coated 1 right again set on 0 setting and placed in 10 degrees of retroversion.  We impacted that in  position and had a stable stem and then selected a 38+3 poly, and impacted that on the tray and then reduced the shoulder.  We were pleased again with our soft tissue balancing.  Tensioning was appropriate.  Axillary nerve was under appropriate tension.   We irrigated thoroughly and repaired the subscapularis anatomically back to lesser tuberosity and then we went ahead and repaired the deltopectoral interval with 0 Vicryl suture followed by 2-0 Vicryl for subcutaneous closure and 4-0 Monocryl for skin.  Steri-Strips applied followed by sterile dressing.  The patient had a sling placed and was taken to recovery room in stable condition.  CN/NUANCE  D:05/30/2020 T:05/30/2020 JOB:[id number]

## 2020-05-31 DIAGNOSIS — M25711 Osteophyte, right shoulder: Secondary | ICD-10-CM | POA: Diagnosis not present

## 2020-05-31 DIAGNOSIS — Z886 Allergy status to analgesic agent status: Secondary | ICD-10-CM | POA: Diagnosis not present

## 2020-05-31 DIAGNOSIS — Z79899 Other long term (current) drug therapy: Secondary | ICD-10-CM | POA: Diagnosis not present

## 2020-05-31 DIAGNOSIS — M19011 Primary osteoarthritis, right shoulder: Secondary | ICD-10-CM | POA: Diagnosis not present

## 2020-05-31 DIAGNOSIS — E871 Hypo-osmolality and hyponatremia: Secondary | ICD-10-CM | POA: Diagnosis not present

## 2020-05-31 DIAGNOSIS — I10 Essential (primary) hypertension: Secondary | ICD-10-CM | POA: Diagnosis not present

## 2020-05-31 DIAGNOSIS — Z880 Allergy status to penicillin: Secondary | ICD-10-CM | POA: Diagnosis not present

## 2020-05-31 DIAGNOSIS — Z885 Allergy status to narcotic agent status: Secondary | ICD-10-CM | POA: Diagnosis not present

## 2020-05-31 DIAGNOSIS — Z881 Allergy status to other antibiotic agents status: Secondary | ICD-10-CM | POA: Diagnosis not present

## 2020-05-31 DIAGNOSIS — Z79890 Hormone replacement therapy (postmenopausal): Secondary | ICD-10-CM | POA: Diagnosis not present

## 2020-05-31 DIAGNOSIS — M898X1 Other specified disorders of bone, shoulder: Secondary | ICD-10-CM | POA: Diagnosis not present

## 2020-05-31 DIAGNOSIS — Z888 Allergy status to other drugs, medicaments and biological substances status: Secondary | ICD-10-CM | POA: Diagnosis not present

## 2020-05-31 LAB — BASIC METABOLIC PANEL
Anion gap: 7 (ref 5–15)
BUN: 8 mg/dL (ref 8–23)
CO2: 23 mmol/L (ref 22–32)
Calcium: 7.8 mg/dL — ABNORMAL LOW (ref 8.9–10.3)
Chloride: 97 mmol/L — ABNORMAL LOW (ref 98–111)
Creatinine, Ser: 0.82 mg/dL (ref 0.44–1.00)
GFR calc Af Amer: >60 mL/min (ref >60)
GFR calc non Af Amer: >60 mL/min (ref >60)
Glucose, Bld: 129 mg/dL — ABNORMAL HIGH (ref 70–99)
Potassium: 3.7 mmol/L (ref 3.5–5.1)
Sodium: 127 mmol/L — ABNORMAL LOW (ref 135–145)

## 2020-05-31 LAB — HEMOGLOBIN AND HEMATOCRIT, BLOOD
HCT: 37.6 % (ref 36.0–46.0)
Hemoglobin: 12.8 g/dL (ref 12.0–15.0)

## 2020-05-31 MED ORDER — ONDANSETRON HCL 4 MG PO TABS: 4.0000 mg | ORAL_TABLET | Freq: Three times a day (TID) | ORAL | 1 refills | Status: DC | PRN

## 2020-05-31 MED ORDER — CEPHALEXIN 500 MG PO CAPS: 500.0000 mg | ORAL_CAPSULE | Freq: Three times a day (TID) | ORAL | 0 refills | Status: AC

## 2020-05-31 NOTE — Progress Notes (Signed)
Pt stable at this time. Pt had questions or concerns at time of discharge instructions and education. Pt dressing dry and intact.

## 2020-05-31 NOTE — Discharge Summary (Signed)
Orthopedic Discharge Summary        Physician Discharge Summary  Patient ID: Lynn Johns MRN: [id number] DOB/AGE: June 18, 1951 69 y.o.  Admit date: 05/30/2020 Discharge date: 05/31/2020   Procedures:  Procedure(s) (LRB): REVERSE SHOULDER ARTHROPLASTY Acromion ORIF with allograft (Right)  Attending Physician:  Dr. 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 Arthritis    lumbar radiculopathy   . Cystocele   . Diverticulosis   . Hemorrhoids   . Hypertension   . Kidney disease    not followed by a nephrologist   . PONV (postoperative nausea and vomiting)    hx of problems with hypotension , no problems with N/V wiht propofol   . Tubular adenoma of colon     PCP: Maurice Small, MD   Discharged Condition: good  Hospital Course:  Patient underwent the above stated procedure on 05/30/2020. Patient tolerated the procedure well and brought to the recovery room in good condition and subsequently to the floor. Patient had an uncomplicated hospital course and was stable for discharge.   Disposition: Discharge disposition: 01-Home or Self Care      with follow up in 2 weeks    Follow-up Information    Netta Cedars, MD. Call in 2 weeks.   Specialty: Orthopedic Surgery Why: [phone] Contact information: [address] [place] [address] [phone]               Discharge Instructions    Call MD / Call 911   Complete by: As directed    If you experience chest pain or shortness of breath, CALL 911 and be transported to the hospital emergency room.  If you develope a fever above 101 F, pus (white drainage) or increased drainage or redness at the wound, or calf pain, call your surgeon's office.   Constipation Prevention   Complete by: As directed    Drink plenty of fluids.  Prune juice  may be helpful.  You may use a stool softener, such as Colace (over the counter) 100 mg twice a day.  Use MiraLax (over the counter) for constipation as needed.   Diet - low sodium heart healthy   Complete by: As directed    Increase activity slowly as tolerated   Complete by: As directed       Allergies as of 05/31/2020      Reactions   Ciprofloxacin    Dr does not want her to take it do to the levaquin giving her tendonitis in her legs   Clindamycin/lincomycin Nausea And Vomiting   Fentanyl    Rash, insomnia, uncontrollable crying   Levaquin [levofloxacin] Other (See Comments)   Tendonitis - very bad reaction   Nsaids    kidney   Percocet [oxycodone-acetaminophen]    Pt is out of it    Valium [diazepam] Other (See Comments)   Makes her cry and be happy at the same time   Versed [midazolam]    Rash, insomnia, memory loss   Penicillins Rash, Other (See Comments)   Vaginal infection       Medication List    TAKE these medications   carvedilol 10 MG 24 hr capsule Commonly known as: COREG CR Take 10 mg by mouth daily.   Estradiol 10 MCG Inst Place 10 mcg vaginally once a week.   fexofenadine 180 MG  tablet Commonly known as: ALLEGRA Take 180 mg by mouth daily as needed for allergies.   HYDROcodone-acetaminophen 5-325 MG tablet Commonly known as: Norco Take 1-2 tablets by mouth every 6 (six) hours as needed for moderate pain or severe pain.   losartan 25 MG tablet Commonly known as: COZAAR Take 25 mg by mouth daily.   methocarbamol 500 MG tablet Commonly known as: Robaxin Take 1 tablet (500 mg total) by mouth 3 (three) times daily as needed. What changed: reasons to take this   methocarbamol 500 MG tablet Commonly known as: Robaxin Take 1 tablet (500 mg total) by mouth every 8 (eight) hours as needed. What changed: You were already taking a medication with the same name, and this prescription was added. Make sure you understand how and when to take each.     potassium chloride SA 20 MEQ tablet Commonly known as: KLOR-CON Take 20 mEq by mouth daily.   spironolactone 50 MG tablet Commonly known as: ALDACTONE Take 50 mg by mouth daily.         Signed: Augustin Schooling 05/31/2020, 8:24 AM  [facility] is now [facility] [address], [place], [place], [place] [address] Phone: Elizabeth

## 2020-05-31 NOTE — Plan of Care (Signed)
  Problem: Clinical Measurements: Goal: Respiratory complications will improve Outcome: Progressing   Problem: Clinical Measurements: Goal: Cardiovascular complication will be avoided Outcome: Progressing   Problem: Activity: Goal: Risk for activity intolerance will decrease Outcome: Progressing   Problem: Coping: Goal: Level of anxiety will decrease Outcome: Progressing   Problem: Elimination: Goal: Will not experience complications related to bowel motility Outcome: Progressing   Problem: Pain Managment: Goal: General experience of comfort will improve Outcome: Progressing   Problem: Safety: Goal: Ability to remain free from injury will improve Outcome: Progressing   Problem: Skin Integrity: Goal: Risk for impaired skin integrity will decrease Outcome: Progressing

## 2020-05-31 NOTE — Progress Notes (Signed)
Orthopedics Progress Note  Subjective: Right shoulder with some pain this morning but manageable  Objective:  Vitals:   05/31/20 0125 05/31/20 0510  BP: 106/65 116/70  Pulse: 69 67  Resp: 16 16  Temp: 98.3 F (36.8 C) 98.1 F (36.7 C)  SpO2: 98% 100%    General: Awake and alert  Musculoskeletal: Right shoulder dressing changed, incisions look good, moderate swelling and bruising Neurovascularly intact  Lab Results  Component Value Date   WBC 10.1 05/13/2020   HGB 12.8 05/31/2020   HCT 37.6 05/31/2020   MCV 91.3 05/13/2020   PLT 373 05/13/2020       Component Value Date/Time   NA 127 (L) 05/31/2020 0320   K 3.7 05/31/2020 0320   CL 97 (L) 05/31/2020 0320   CO2 23 05/31/2020 0320   GLUCOSE 129 (H) 05/31/2020 0320   BUN 8 05/31/2020 0320   CREATININE 0.82 05/31/2020 0320   CALCIUM 7.8 (L) 05/31/2020 0320   GFRNONAA >60 05/31/2020 0320   GFRAA >60 05/31/2020 0320    No results found for: INR, PROTIME  Assessment/Plan: POD #1 s/p Procedure(s): REVERSE SHOULDER ARTHROPLASTY Acromion ORIF with allograft Hyponatremia - patient reports that she has chronic issues with this.  She is asymptomatic this AM.  OT completed, family and patient instructed regarding wound care and restrictions Follow up in 10 days  Doran Heater. Veverly Fells, MD 05/31/2020 8:21 AM

## 2020-05-31 NOTE — Evaluation (Signed)
Occupational Therapy Evaluation Patient Details Name: Lynn Johns MRN: [id number] DOB: [birth year] Today's Date: 05/31/2020    History of Present Illness Patient is a 69 year old female admitted for right reverse total shoulder replacement .   Clinical Impression   Educated patient in shoulder protocol, exercises prescribed and compensatory strategies for self care. Patient mod I level.     Follow Up Recommendations  Follow surgeon's recommendation for DC plan and follow-up therapies    Equipment Recommendations  None recommended by OT       Precautions / Restrictions Precautions Precautions: Shoulder Type of Shoulder Precautions: pt reports no sling and MD okay with this, AROM elbow wrist and hand ok Precaution Booklet Issued: Yes (comment) Restrictions Weight Bearing Restrictions: Yes RUE Weight Bearing: Non weight bearing      Mobility Bed Mobility Overal bed mobility: Modified Independent                Transfers Overall transfer level: Modified independent                    Balance Overall balance assessment: No apparent balance deficits (not formally assessed)                                         ADL either performed or assessed with clinical judgement   ADL Overall ADL's : Modified independent                                       General ADL Comments: patient with previous shoulder surgeries, demo's compensatory strategies well for dressing, I with mobility                  Pertinent Vitals/Pain Pain Assessment: 0-10 Pain Score: 5  Pain Location: R elbow/shoulder Pain Descriptors / Indicators: Discomfort Pain Intervention(s): Repositioned;Monitored during session     Hand Dominance  ("I use both")   Extremity/Trunk Assessment Upper Extremity Assessment Upper Extremity Assessment: RUE deficits/detail RUE Deficits / Details: elbow, wrist, hand AROM grossly intact   Lower Extremity  Assessment Lower Extremity Assessment: Overall WFL for tasks assessed   Cervical / Trunk Assessment Cervical / Trunk Assessment: Normal   Communication Communication Communication: No difficulties   Cognition Arousal/Alertness: Awake/alert Behavior During Therapy: WFL for tasks assessed/performed Overall Cognitive Status: Within Functional Limits for tasks assessed                                        Exercises Exercises: Shoulder Shoulder Exercises Elbow Flexion: Right;AROM;5 reps;Seated Elbow Extension: AROM;Right;5 reps;Seated Wrist Flexion: AROM;Right;5 reps;Seated Wrist Extension: AROM;Right;5 reps;Seated Digit Composite Flexion: AROM;Right;5 reps;Seated Composite Extension: AROM;Right;5 reps;Seated   Shoulder Instructions Shoulder Instructions Donning/doffing shirt without moving shoulder: Independent Method for sponge bathing under operated UE: Independent Donning/doffing sling/immobilizer:  (N/A) Correct positioning of sling/immobilizer:  (N/A) Pendulum exercises (written home exercise program):  (N/A) ROM for elbow, wrist and digits of operated UE: Independent Sling wearing schedule (on at all times/off for ADL's):  (N/A) Proper positioning of operated UE when showering: Independent Dressing change: Independent Positioning of UE while sleeping: [place] expects to be discharged to:: Private residence Living Arrangements: Spouse/significant other Available Help at Discharge:  Family Type of Home: House       Home Layout: Two level;Bed/bath upstairs     Bathroom Shower/Tub: Walk-in Psychologist, prison and probation services: Handicapped height     Home Equipment: None          Prior Functioning/Environment Level of Independence: Independent                 OT Problem List: Impaired UE functional use;Pain         OT Goals(Current goals can be found in the care plan section) Acute Rehab OT Goals Patient  Stated Goal: go home OT Goal Formulation: With patient Time For Goal Achievement: 06/14/20 Potential to Achieve Goals: Good   AM-PAC OT "6 Clicks" Daily Activity     Outcome Measure Help from another person eating meals?: None Help from another person taking care of personal grooming?: None Help from another person toileting, which includes using toliet, bedpan, or urinal?: None Help from another person bathing (including washing, rinsing, drying)?: None Help from another person to put on and taking off regular upper body clothing?: None Help from another person to put on and taking off regular lower body clothing?: None 6 Click Score: 24   End of Session Nurse Communication: Mobility status  Activity Tolerance: Patient tolerated treatment well Patient left: in chair;with call bell/phone within reach  OT Visit Diagnosis: Pain Pain - Right/Left: Right Pain - part of body: Shoulder                Time: 7782-4235 OT Time Calculation (min): 34 min Charges:  OT General Charges $OT Visit: 1 Visit OT Evaluation $OT Eval Moderate Complexity: 1 Mod  Delbert Phenix OT Pager: [place] 05/31/2020, 9:55 AM

## 2020-05-31 NOTE — Discharge Summary (Signed)
Orthopedic Discharge Summary        Physician Discharge Summary  Patient ID: Lynn Johns MRN: [id number] DOB/AGE: [birth year] 69 y.o.  Admit date: 05/30/2020 Discharge date: 05/31/2020   Procedures:  Procedure(s) (LRB): REVERSE SHOULDER ARTHROPLASTY Acromion ORIF with allograft (Right)  Attending Physician:  Dr. Esmond Plants  Admission Diagnoses:   Right shoulder rotator cuff tear arthropathy and impending fracture of scapula  Discharge Diagnoses:  same   Past Medical History:  Diagnosis Date  . Arthritis    lumbar radiculopathy   . Cystocele   . Diverticulosis   . Hemorrhoids   . Hypertension   . Kidney disease    not followed by a nephrologist   . PONV (postoperative nausea and vomiting)    hx of problems with hypotension , no problems with N/V wiht propofol   . Tubular adenoma of colon     PCP: Maurice Small, MD   Discharged Condition: good  Hospital Course:  Patient underwent the above stated procedure on 05/30/2020. Patient tolerated the procedure well and brought to the recovery room in good condition and subsequently to the floor. Patient had an uncomplicated hospital course and was stable for discharge.   Disposition: Discharge disposition: 01-Home or Self Care      with follow up in 2 weeks    Follow-up Information    Netta Cedars, MD. Call in 2 weeks.   Specialty: Orthopedic Surgery Why: [phone] Contact information: [address] [place] [address] [phone]               Discharge Instructions    Call MD / Call 911   Complete by: As directed    If you experience chest pain or shortness of breath, CALL 911 and be transported to the hospital emergency room.  If you develope a fever above 101 F, pus (white drainage) or increased drainage or redness at the wound, or calf pain, call your surgeon's office.   Call MD / Call 911   Complete by: As directed    If you experience chest pain or shortness of breath,  CALL 911 and be transported to the hospital emergency room.  If you develope a fever above 101 F, pus (white drainage) or increased drainage or redness at the wound, or calf pain, call your surgeon's office.   Constipation Prevention   Complete by: As directed    Drink plenty of fluids.  Prune juice may be helpful.  You may use a stool softener, such as Colace (over the counter) 100 mg twice a day.  Use MiraLax (over the counter) for constipation as needed.   Constipation Prevention   Complete by: As directed    Drink plenty of fluids.  Prune juice may be helpful.  You may use a stool softener, such as Colace (over the counter) 100 mg twice a day.  Use MiraLax (over the counter) for constipation as needed.   Diet - low sodium heart healthy   Complete by: As directed    Diet - low sodium heart healthy   Complete by: As directed    Increase activity slowly as tolerated   Complete by: As directed    Increase activity slowly as tolerated   Complete by: As directed       Allergies as of 05/31/2020      Reactions   Ciprofloxacin    Dr does not want her to take it do to the levaquin giving her tendonitis in her legs  Clindamycin/lincomycin Nausea And Vomiting   Fentanyl    Rash, insomnia, uncontrollable crying   Levaquin [levofloxacin] Other (See Comments)   Tendonitis - very bad reaction   Nsaids    kidney   Percocet [oxycodone-acetaminophen]    Pt is out of it    Valium [diazepam] Other (See Comments)   Makes her cry and be happy at the same time   Versed [midazolam]    Rash, insomnia, memory loss   Penicillins Rash, Other (See Comments)   Vaginal infection       Medication List    TAKE these medications   carvedilol 10 MG 24 hr capsule Commonly known as: COREG CR Take 10 mg by mouth daily.   cephALEXin 500 MG capsule Commonly known as: Keflex Take 1 capsule (500 mg total) by mouth 3 (three) times daily for 5 days.   Estradiol 10 MCG Inst Place 10 mcg vaginally once a  week.   fexofenadine 180 MG tablet Commonly known as: ALLEGRA Take 180 mg by mouth daily as needed for allergies.   HYDROcodone-acetaminophen 5-325 MG tablet Commonly known as: Norco Take 1-2 tablets by mouth every 6 (six) hours as needed for moderate pain or severe pain.   losartan 25 MG tablet Commonly known as: COZAAR Take 25 mg by mouth daily.   methocarbamol 500 MG tablet Commonly known as: Robaxin Take 1 tablet (500 mg total) by mouth 3 (three) times daily as needed. What changed: reasons to take this   methocarbamol 500 MG tablet Commonly known as: Robaxin Take 1 tablet (500 mg total) by mouth every 8 (eight) hours as needed. What changed: You were already taking a medication with the same name, and this prescription was added. Make sure you understand how and when to take each.   ondansetron 4 MG tablet Commonly known as: Zofran Take 1 tablet (4 mg total) by mouth every 8 (eight) hours as needed for nausea or vomiting.   potassium chloride SA 20 MEQ tablet Commonly known as: KLOR-CON Take 20 mEq by mouth daily.   spironolactone 50 MG tablet Commonly known as: ALDACTONE Take 50 mg by mouth daily.         Signed: Augustin Schooling 05/31/2020, 9:01 AM  [facility] is now [facility] [address] [place], [place], [place] [address] Phone: [place]

## 2020-06-04 ENCOUNTER — Encounter (HOSPITAL_COMMUNITY): Payer: Self-pay | Admitting: Orthopedic Surgery

## 2020-06-10 DIAGNOSIS — M25511 Pain in right shoulder: Secondary | ICD-10-CM | POA: Diagnosis not present

## 2020-06-17 DIAGNOSIS — R293 Abnormal posture: Secondary | ICD-10-CM | POA: Diagnosis not present

## 2020-06-17 DIAGNOSIS — M2624 Reverse articulation: Secondary | ICD-10-CM | POA: Diagnosis not present

## 2020-06-17 DIAGNOSIS — M25511 Pain in right shoulder: Secondary | ICD-10-CM | POA: Diagnosis not present

## 2020-06-17 DIAGNOSIS — M6281 Muscle weakness (generalized): Secondary | ICD-10-CM | POA: Diagnosis not present

## 2020-06-20 DIAGNOSIS — M6281 Muscle weakness (generalized): Secondary | ICD-10-CM | POA: Diagnosis not present

## 2020-06-20 DIAGNOSIS — M2624 Reverse articulation: Secondary | ICD-10-CM | POA: Diagnosis not present

## 2020-06-20 DIAGNOSIS — R293 Abnormal posture: Secondary | ICD-10-CM | POA: Diagnosis not present

## 2020-06-20 DIAGNOSIS — M25511 Pain in right shoulder: Secondary | ICD-10-CM | POA: Diagnosis not present

## 2020-06-23 DIAGNOSIS — M2624 Reverse articulation: Secondary | ICD-10-CM | POA: Diagnosis not present

## 2020-06-23 DIAGNOSIS — R293 Abnormal posture: Secondary | ICD-10-CM | POA: Diagnosis not present

## 2020-06-23 DIAGNOSIS — M6281 Muscle weakness (generalized): Secondary | ICD-10-CM | POA: Diagnosis not present

## 2020-06-23 DIAGNOSIS — M25511 Pain in right shoulder: Secondary | ICD-10-CM | POA: Diagnosis not present

## 2020-06-27 DIAGNOSIS — R293 Abnormal posture: Secondary | ICD-10-CM | POA: Diagnosis not present

## 2020-06-27 DIAGNOSIS — M25511 Pain in right shoulder: Secondary | ICD-10-CM | POA: Diagnosis not present

## 2020-06-27 DIAGNOSIS — M2624 Reverse articulation: Secondary | ICD-10-CM | POA: Diagnosis not present

## 2020-06-27 DIAGNOSIS — M6281 Muscle weakness (generalized): Secondary | ICD-10-CM | POA: Diagnosis not present

## 2020-07-01 DIAGNOSIS — M25511 Pain in right shoulder: Secondary | ICD-10-CM | POA: Diagnosis not present

## 2020-07-01 DIAGNOSIS — M6281 Muscle weakness (generalized): Secondary | ICD-10-CM | POA: Diagnosis not present

## 2020-07-01 DIAGNOSIS — M2624 Reverse articulation: Secondary | ICD-10-CM | POA: Diagnosis not present

## 2020-07-01 DIAGNOSIS — R293 Abnormal posture: Secondary | ICD-10-CM | POA: Diagnosis not present

## 2020-07-02 DIAGNOSIS — M150 Primary generalized (osteo)arthritis: Secondary | ICD-10-CM | POA: Diagnosis not present

## 2020-07-02 DIAGNOSIS — M5030 Other cervical disc degeneration, unspecified cervical region: Secondary | ICD-10-CM | POA: Diagnosis not present

## 2020-07-02 DIAGNOSIS — M2550 Pain in unspecified joint: Secondary | ICD-10-CM | POA: Diagnosis not present

## 2020-07-02 DIAGNOSIS — M5136 Other intervertebral disc degeneration, lumbar region: Secondary | ICD-10-CM | POA: Diagnosis not present

## 2020-07-04 DIAGNOSIS — M2624 Reverse articulation: Secondary | ICD-10-CM | POA: Diagnosis not present

## 2020-07-04 DIAGNOSIS — M6281 Muscle weakness (generalized): Secondary | ICD-10-CM | POA: Diagnosis not present

## 2020-07-04 DIAGNOSIS — R293 Abnormal posture: Secondary | ICD-10-CM | POA: Diagnosis not present

## 2020-07-04 DIAGNOSIS — M25511 Pain in right shoulder: Secondary | ICD-10-CM | POA: Diagnosis not present

## 2020-07-08 DIAGNOSIS — M2624 Reverse articulation: Secondary | ICD-10-CM | POA: Diagnosis not present

## 2020-07-08 DIAGNOSIS — R293 Abnormal posture: Secondary | ICD-10-CM | POA: Diagnosis not present

## 2020-07-08 DIAGNOSIS — M6281 Muscle weakness (generalized): Secondary | ICD-10-CM | POA: Diagnosis not present

## 2020-07-08 DIAGNOSIS — M25511 Pain in right shoulder: Secondary | ICD-10-CM | POA: Diagnosis not present

## 2020-07-08 DIAGNOSIS — M1711 Unilateral primary osteoarthritis, right knee: Secondary | ICD-10-CM | POA: Diagnosis not present

## 2020-07-11 DIAGNOSIS — R293 Abnormal posture: Secondary | ICD-10-CM | POA: Diagnosis not present

## 2020-07-11 DIAGNOSIS — M2624 Reverse articulation: Secondary | ICD-10-CM | POA: Diagnosis not present

## 2020-07-11 DIAGNOSIS — M6281 Muscle weakness (generalized): Secondary | ICD-10-CM | POA: Diagnosis not present

## 2020-07-11 DIAGNOSIS — M25511 Pain in right shoulder: Secondary | ICD-10-CM | POA: Diagnosis not present

## 2020-07-15 DIAGNOSIS — M2624 Reverse articulation: Secondary | ICD-10-CM | POA: Diagnosis not present

## 2020-07-15 DIAGNOSIS — M6281 Muscle weakness (generalized): Secondary | ICD-10-CM | POA: Diagnosis not present

## 2020-07-15 DIAGNOSIS — M25511 Pain in right shoulder: Secondary | ICD-10-CM | POA: Diagnosis not present

## 2020-07-15 DIAGNOSIS — R293 Abnormal posture: Secondary | ICD-10-CM | POA: Diagnosis not present

## 2020-07-18 DIAGNOSIS — M6281 Muscle weakness (generalized): Secondary | ICD-10-CM | POA: Diagnosis not present

## 2020-07-18 DIAGNOSIS — M25511 Pain in right shoulder: Secondary | ICD-10-CM | POA: Diagnosis not present

## 2020-07-18 DIAGNOSIS — R293 Abnormal posture: Secondary | ICD-10-CM | POA: Diagnosis not present

## 2020-07-18 DIAGNOSIS — M2624 Reverse articulation: Secondary | ICD-10-CM | POA: Diagnosis not present

## 2020-07-22 DIAGNOSIS — M25511 Pain in right shoulder: Secondary | ICD-10-CM | POA: Diagnosis not present

## 2020-07-22 DIAGNOSIS — M6281 Muscle weakness (generalized): Secondary | ICD-10-CM | POA: Diagnosis not present

## 2020-07-22 DIAGNOSIS — M2624 Reverse articulation: Secondary | ICD-10-CM | POA: Diagnosis not present

## 2020-07-22 DIAGNOSIS — R293 Abnormal posture: Secondary | ICD-10-CM | POA: Diagnosis not present

## 2020-07-25 DIAGNOSIS — M25511 Pain in right shoulder: Secondary | ICD-10-CM | POA: Diagnosis not present

## 2020-07-25 DIAGNOSIS — M6281 Muscle weakness (generalized): Secondary | ICD-10-CM | POA: Diagnosis not present

## 2020-07-25 DIAGNOSIS — R293 Abnormal posture: Secondary | ICD-10-CM | POA: Diagnosis not present

## 2020-07-25 DIAGNOSIS — M2624 Reverse articulation: Secondary | ICD-10-CM | POA: Diagnosis not present

## 2020-07-29 DIAGNOSIS — M25511 Pain in right shoulder: Secondary | ICD-10-CM | POA: Diagnosis not present

## 2020-07-29 DIAGNOSIS — R293 Abnormal posture: Secondary | ICD-10-CM | POA: Diagnosis not present

## 2020-07-29 DIAGNOSIS — M6281 Muscle weakness (generalized): Secondary | ICD-10-CM | POA: Diagnosis not present

## 2020-07-29 DIAGNOSIS — M2624 Reverse articulation: Secondary | ICD-10-CM | POA: Diagnosis not present

## 2020-08-01 DIAGNOSIS — M25511 Pain in right shoulder: Secondary | ICD-10-CM | POA: Diagnosis not present

## 2020-08-01 DIAGNOSIS — M6281 Muscle weakness (generalized): Secondary | ICD-10-CM | POA: Diagnosis not present

## 2020-08-01 DIAGNOSIS — M2624 Reverse articulation: Secondary | ICD-10-CM | POA: Diagnosis not present

## 2020-08-01 DIAGNOSIS — R293 Abnormal posture: Secondary | ICD-10-CM | POA: Diagnosis not present

## 2020-08-04 ENCOUNTER — Other Ambulatory Visit: Payer: Self-pay | Admitting: Family Medicine

## 2020-08-04 DIAGNOSIS — Z1231 Encounter for screening mammogram for malignant neoplasm of breast: Secondary | ICD-10-CM

## 2020-08-05 DIAGNOSIS — R293 Abnormal posture: Secondary | ICD-10-CM | POA: Diagnosis not present

## 2020-08-05 DIAGNOSIS — M6281 Muscle weakness (generalized): Secondary | ICD-10-CM | POA: Diagnosis not present

## 2020-08-05 DIAGNOSIS — M2624 Reverse articulation: Secondary | ICD-10-CM | POA: Diagnosis not present

## 2020-08-05 DIAGNOSIS — M25511 Pain in right shoulder: Secondary | ICD-10-CM | POA: Diagnosis not present

## 2020-08-07 DIAGNOSIS — M25511 Pain in right shoulder: Secondary | ICD-10-CM | POA: Diagnosis not present

## 2020-08-07 DIAGNOSIS — M6281 Muscle weakness (generalized): Secondary | ICD-10-CM | POA: Diagnosis not present

## 2020-08-07 DIAGNOSIS — M2624 Reverse articulation: Secondary | ICD-10-CM | POA: Diagnosis not present

## 2020-08-07 DIAGNOSIS — R293 Abnormal posture: Secondary | ICD-10-CM | POA: Diagnosis not present

## 2020-08-15 DIAGNOSIS — M6281 Muscle weakness (generalized): Secondary | ICD-10-CM | POA: Diagnosis not present

## 2020-08-15 DIAGNOSIS — M2624 Reverse articulation: Secondary | ICD-10-CM | POA: Diagnosis not present

## 2020-08-15 DIAGNOSIS — R293 Abnormal posture: Secondary | ICD-10-CM | POA: Diagnosis not present

## 2020-08-15 DIAGNOSIS — M25511 Pain in right shoulder: Secondary | ICD-10-CM | POA: Diagnosis not present

## 2020-08-19 DIAGNOSIS — Z4789 Encounter for other orthopedic aftercare: Secondary | ICD-10-CM | POA: Diagnosis not present

## 2020-08-19 DIAGNOSIS — M25511 Pain in right shoulder: Secondary | ICD-10-CM | POA: Diagnosis not present

## 2020-08-22 DIAGNOSIS — M2624 Reverse articulation: Secondary | ICD-10-CM | POA: Diagnosis not present

## 2020-08-22 DIAGNOSIS — M25511 Pain in right shoulder: Secondary | ICD-10-CM | POA: Diagnosis not present

## 2020-08-22 DIAGNOSIS — R293 Abnormal posture: Secondary | ICD-10-CM | POA: Diagnosis not present

## 2020-08-22 DIAGNOSIS — M6281 Muscle weakness (generalized): Secondary | ICD-10-CM | POA: Diagnosis not present

## 2020-08-29 DIAGNOSIS — M6281 Muscle weakness (generalized): Secondary | ICD-10-CM | POA: Diagnosis not present

## 2020-08-29 DIAGNOSIS — M25511 Pain in right shoulder: Secondary | ICD-10-CM | POA: Diagnosis not present

## 2020-08-29 DIAGNOSIS — M2624 Reverse articulation: Secondary | ICD-10-CM | POA: Diagnosis not present

## 2020-08-29 DIAGNOSIS — R293 Abnormal posture: Secondary | ICD-10-CM | POA: Diagnosis not present

## 2020-09-01 DIAGNOSIS — M25511 Pain in right shoulder: Secondary | ICD-10-CM | POA: Diagnosis not present

## 2020-09-01 DIAGNOSIS — M2624 Reverse articulation: Secondary | ICD-10-CM | POA: Diagnosis not present

## 2020-09-01 DIAGNOSIS — M6281 Muscle weakness (generalized): Secondary | ICD-10-CM | POA: Diagnosis not present

## 2020-09-01 DIAGNOSIS — R293 Abnormal posture: Secondary | ICD-10-CM | POA: Diagnosis not present

## 2020-09-04 DIAGNOSIS — M1711 Unilateral primary osteoarthritis, right knee: Secondary | ICD-10-CM | POA: Diagnosis not present

## 2020-09-09 DIAGNOSIS — M1711 Unilateral primary osteoarthritis, right knee: Secondary | ICD-10-CM | POA: Diagnosis not present

## 2020-09-10 ENCOUNTER — Ambulatory Visit: Payer: BC Managed Care – PPO

## 2020-09-12 DIAGNOSIS — R293 Abnormal posture: Secondary | ICD-10-CM | POA: Diagnosis not present

## 2020-09-12 DIAGNOSIS — M25511 Pain in right shoulder: Secondary | ICD-10-CM | POA: Diagnosis not present

## 2020-09-12 DIAGNOSIS — M2624 Reverse articulation: Secondary | ICD-10-CM | POA: Diagnosis not present

## 2020-09-12 DIAGNOSIS — M6281 Muscle weakness (generalized): Secondary | ICD-10-CM | POA: Diagnosis not present

## 2020-09-16 DIAGNOSIS — Z6823 Body mass index (BMI) 23.0-23.9, adult: Secondary | ICD-10-CM | POA: Diagnosis not present

## 2020-09-16 DIAGNOSIS — Z01419 Encounter for gynecological examination (general) (routine) without abnormal findings: Secondary | ICD-10-CM | POA: Diagnosis not present

## 2020-09-18 DIAGNOSIS — M1711 Unilateral primary osteoarthritis, right knee: Secondary | ICD-10-CM | POA: Diagnosis not present

## 2020-09-19 DIAGNOSIS — R293 Abnormal posture: Secondary | ICD-10-CM | POA: Diagnosis not present

## 2020-09-19 DIAGNOSIS — M2624 Reverse articulation: Secondary | ICD-10-CM | POA: Diagnosis not present

## 2020-09-19 DIAGNOSIS — M6281 Muscle weakness (generalized): Secondary | ICD-10-CM | POA: Diagnosis not present

## 2020-09-19 DIAGNOSIS — M25511 Pain in right shoulder: Secondary | ICD-10-CM | POA: Diagnosis not present

## 2020-09-30 ENCOUNTER — Ambulatory Visit
Admission: RE | Admit: 2020-09-30 | Discharge: 2020-09-30 | Disposition: A | Discharge status: Home / Self Care | Payer: BC Managed Care – PPO | Source: Ambulatory Visit | Attending: Family Medicine | Admitting: Family Medicine

## 2020-09-30 ENCOUNTER — Other Ambulatory Visit: Payer: Self-pay

## 2020-09-30 DIAGNOSIS — Z1231 Encounter for screening mammogram for malignant neoplasm of breast: Secondary | ICD-10-CM | POA: Diagnosis not present

## 2020-10-03 DIAGNOSIS — R293 Abnormal posture: Secondary | ICD-10-CM | POA: Diagnosis not present

## 2020-10-03 DIAGNOSIS — M6281 Muscle weakness (generalized): Secondary | ICD-10-CM | POA: Diagnosis not present

## 2020-10-03 DIAGNOSIS — M2624 Reverse articulation: Secondary | ICD-10-CM | POA: Diagnosis not present

## 2020-10-03 DIAGNOSIS — M25511 Pain in right shoulder: Secondary | ICD-10-CM | POA: Diagnosis not present

## 2020-10-06 ENCOUNTER — Other Ambulatory Visit: Payer: Self-pay | Admitting: Family Medicine

## 2020-10-06 DIAGNOSIS — R928 Other abnormal and inconclusive findings on diagnostic imaging of breast: Secondary | ICD-10-CM

## 2020-10-14 DIAGNOSIS — H2513 Age-related nuclear cataract, bilateral: Secondary | ICD-10-CM | POA: Diagnosis not present

## 2020-10-14 DIAGNOSIS — H5213 Myopia, bilateral: Secondary | ICD-10-CM | POA: Diagnosis not present

## 2020-10-14 DIAGNOSIS — H353131 Nonexudative age-related macular degeneration, bilateral, early dry stage: Secondary | ICD-10-CM | POA: Diagnosis not present

## 2020-10-17 DIAGNOSIS — M25511 Pain in right shoulder: Secondary | ICD-10-CM | POA: Diagnosis not present

## 2020-10-17 DIAGNOSIS — R293 Abnormal posture: Secondary | ICD-10-CM | POA: Diagnosis not present

## 2020-10-17 DIAGNOSIS — M2624 Reverse articulation: Secondary | ICD-10-CM | POA: Diagnosis not present

## 2020-10-17 DIAGNOSIS — M6281 Muscle weakness (generalized): Secondary | ICD-10-CM | POA: Diagnosis not present

## 2020-10-21 DIAGNOSIS — M25511 Pain in right shoulder: Secondary | ICD-10-CM | POA: Diagnosis not present

## 2020-10-21 DIAGNOSIS — M25512 Pain in left shoulder: Secondary | ICD-10-CM | POA: Diagnosis not present

## 2020-10-23 ENCOUNTER — Other Ambulatory Visit: Payer: Self-pay

## 2020-10-23 ENCOUNTER — Other Ambulatory Visit: Payer: Self-pay | Admitting: Family Medicine

## 2020-10-23 ENCOUNTER — Ambulatory Visit
Admission: RE | Admit: 2020-10-23 | Discharge: 2020-10-23 | Disposition: A | Discharge status: Home / Self Care | Payer: BC Managed Care – PPO | Source: Ambulatory Visit | Attending: Family Medicine | Admitting: Family Medicine

## 2020-10-23 DIAGNOSIS — R928 Other abnormal and inconclusive findings on diagnostic imaging of breast: Secondary | ICD-10-CM

## 2020-10-27 NOTE — Telephone Encounter (Signed)
error 

## 2020-10-29 ENCOUNTER — Other Ambulatory Visit: Payer: Self-pay

## 2020-10-29 ENCOUNTER — Ambulatory Visit
Admission: RE | Admit: 2020-10-29 | Discharge: 2020-10-29 | Disposition: A | Discharge status: Home / Self Care | Payer: BC Managed Care – PPO | Source: Ambulatory Visit | Attending: Family Medicine | Admitting: Family Medicine

## 2020-10-29 ENCOUNTER — Other Ambulatory Visit: Payer: Self-pay | Admitting: Family Medicine

## 2020-10-29 DIAGNOSIS — R928 Other abnormal and inconclusive findings on diagnostic imaging of breast: Secondary | ICD-10-CM

## 2020-10-29 DIAGNOSIS — D242 Benign neoplasm of left breast: Secondary | ICD-10-CM | POA: Diagnosis not present

## 2020-10-29 DIAGNOSIS — N6322 Unspecified lump in the left breast, upper inner quadrant: Secondary | ICD-10-CM | POA: Diagnosis not present

## 2020-11-03 DIAGNOSIS — M2624 Reverse articulation: Secondary | ICD-10-CM | POA: Diagnosis not present

## 2020-11-03 DIAGNOSIS — M6281 Muscle weakness (generalized): Secondary | ICD-10-CM | POA: Diagnosis not present

## 2020-11-03 DIAGNOSIS — R293 Abnormal posture: Secondary | ICD-10-CM | POA: Diagnosis not present

## 2020-11-03 DIAGNOSIS — M25511 Pain in right shoulder: Secondary | ICD-10-CM | POA: Diagnosis not present

## 2020-11-17 DIAGNOSIS — Z0000 Encounter for general adult medical examination without abnormal findings: Secondary | ICD-10-CM | POA: Diagnosis not present

## 2020-11-17 DIAGNOSIS — I10 Essential (primary) hypertension: Secondary | ICD-10-CM | POA: Diagnosis not present

## 2020-11-17 DIAGNOSIS — Z13220 Encounter for screening for lipoid disorders: Secondary | ICD-10-CM | POA: Diagnosis not present

## 2020-12-19 DIAGNOSIS — M25511 Pain in right shoulder: Secondary | ICD-10-CM | POA: Diagnosis not present

## 2020-12-19 DIAGNOSIS — M25611 Stiffness of right shoulder, not elsewhere classified: Secondary | ICD-10-CM | POA: Diagnosis not present

## 2020-12-19 DIAGNOSIS — M25512 Pain in left shoulder: Secondary | ICD-10-CM | POA: Diagnosis not present

## 2020-12-19 DIAGNOSIS — M542 Cervicalgia: Secondary | ICD-10-CM | POA: Diagnosis not present

## 2020-12-22 ENCOUNTER — Ambulatory Visit: Payer: Self-pay | Admitting: General Surgery

## 2020-12-22 DIAGNOSIS — D242 Benign neoplasm of left breast: Secondary | ICD-10-CM

## 2020-12-26 DIAGNOSIS — M25511 Pain in right shoulder: Secondary | ICD-10-CM | POA: Diagnosis not present

## 2020-12-26 DIAGNOSIS — M542 Cervicalgia: Secondary | ICD-10-CM | POA: Diagnosis not present

## 2020-12-26 DIAGNOSIS — M25512 Pain in left shoulder: Secondary | ICD-10-CM | POA: Diagnosis not present

## 2020-12-26 DIAGNOSIS — M25611 Stiffness of right shoulder, not elsewhere classified: Secondary | ICD-10-CM | POA: Diagnosis not present

## 2020-12-31 ENCOUNTER — Other Ambulatory Visit: Payer: Self-pay | Admitting: General Surgery

## 2020-12-31 DIAGNOSIS — D242 Benign neoplasm of left breast: Secondary | ICD-10-CM

## 2021-01-01 DIAGNOSIS — M25511 Pain in right shoulder: Secondary | ICD-10-CM | POA: Diagnosis not present

## 2021-01-02 DIAGNOSIS — M542 Cervicalgia: Secondary | ICD-10-CM | POA: Diagnosis not present

## 2021-01-02 DIAGNOSIS — M25512 Pain in left shoulder: Secondary | ICD-10-CM | POA: Diagnosis not present

## 2021-01-02 DIAGNOSIS — M25511 Pain in right shoulder: Secondary | ICD-10-CM | POA: Diagnosis not present

## 2021-01-02 DIAGNOSIS — M25611 Stiffness of right shoulder, not elsewhere classified: Secondary | ICD-10-CM | POA: Diagnosis not present

## 2021-01-09 DIAGNOSIS — M25611 Stiffness of right shoulder, not elsewhere classified: Secondary | ICD-10-CM | POA: Diagnosis not present

## 2021-01-09 DIAGNOSIS — M25512 Pain in left shoulder: Secondary | ICD-10-CM | POA: Diagnosis not present

## 2021-01-09 DIAGNOSIS — M25511 Pain in right shoulder: Secondary | ICD-10-CM | POA: Diagnosis not present

## 2021-01-09 DIAGNOSIS — M542 Cervicalgia: Secondary | ICD-10-CM | POA: Diagnosis not present

## 2021-01-16 DIAGNOSIS — M542 Cervicalgia: Secondary | ICD-10-CM | POA: Diagnosis not present

## 2021-01-16 DIAGNOSIS — M25512 Pain in left shoulder: Secondary | ICD-10-CM | POA: Diagnosis not present

## 2021-01-16 DIAGNOSIS — M25611 Stiffness of right shoulder, not elsewhere classified: Secondary | ICD-10-CM | POA: Diagnosis not present

## 2021-01-16 DIAGNOSIS — M25511 Pain in right shoulder: Secondary | ICD-10-CM | POA: Diagnosis not present

## 2021-01-28 DIAGNOSIS — M542 Cervicalgia: Secondary | ICD-10-CM | POA: Diagnosis not present

## 2021-01-28 DIAGNOSIS — M25611 Stiffness of right shoulder, not elsewhere classified: Secondary | ICD-10-CM | POA: Diagnosis not present

## 2021-01-28 DIAGNOSIS — M25512 Pain in left shoulder: Secondary | ICD-10-CM | POA: Diagnosis not present

## 2021-01-28 DIAGNOSIS — M25511 Pain in right shoulder: Secondary | ICD-10-CM | POA: Diagnosis not present

## 2021-01-29 ENCOUNTER — Encounter (HOSPITAL_BASED_OUTPATIENT_CLINIC_OR_DEPARTMENT_OTHER): Payer: Self-pay | Admitting: General Surgery

## 2021-01-29 ENCOUNTER — Other Ambulatory Visit: Payer: Self-pay

## 2021-02-02 DIAGNOSIS — M25512 Pain in left shoulder: Secondary | ICD-10-CM | POA: Diagnosis not present

## 2021-02-02 DIAGNOSIS — M25511 Pain in right shoulder: Secondary | ICD-10-CM | POA: Diagnosis not present

## 2021-02-02 DIAGNOSIS — M542 Cervicalgia: Secondary | ICD-10-CM | POA: Diagnosis not present

## 2021-02-02 DIAGNOSIS — M25611 Stiffness of right shoulder, not elsewhere classified: Secondary | ICD-10-CM | POA: Diagnosis not present

## 2021-02-03 ENCOUNTER — Other Ambulatory Visit (HOSPITAL_COMMUNITY)
Admission: RE | Admit: 2021-02-03 | Discharge: 2021-02-03 | Disposition: A | Discharge status: Home / Self Care | Payer: BC Managed Care – PPO | Source: Ambulatory Visit | Attending: General Surgery | Admitting: General Surgery

## 2021-02-03 DIAGNOSIS — Z20822 Contact with and (suspected) exposure to covid-19: Secondary | ICD-10-CM | POA: Insufficient documentation

## 2021-02-03 DIAGNOSIS — Z885 Allergy status to narcotic agent status: Secondary | ICD-10-CM | POA: Diagnosis not present

## 2021-02-03 DIAGNOSIS — Z880 Allergy status to penicillin: Secondary | ICD-10-CM | POA: Diagnosis not present

## 2021-02-03 DIAGNOSIS — D242 Benign neoplasm of left breast: Secondary | ICD-10-CM | POA: Diagnosis not present

## 2021-02-03 DIAGNOSIS — Z888 Allergy status to other drugs, medicaments and biological substances status: Secondary | ICD-10-CM | POA: Diagnosis not present

## 2021-02-03 DIAGNOSIS — Z01812 Encounter for preprocedural laboratory examination: Secondary | ICD-10-CM | POA: Insufficient documentation

## 2021-02-03 DIAGNOSIS — Z79899 Other long term (current) drug therapy: Secondary | ICD-10-CM | POA: Diagnosis not present

## 2021-02-03 DIAGNOSIS — Z881 Allergy status to other antibiotic agents status: Secondary | ICD-10-CM | POA: Diagnosis not present

## 2021-02-03 DIAGNOSIS — Z886 Allergy status to analgesic agent status: Secondary | ICD-10-CM | POA: Diagnosis not present

## 2021-02-03 DIAGNOSIS — Z8249 Family history of ischemic heart disease and other diseases of the circulatory system: Secondary | ICD-10-CM | POA: Diagnosis not present

## 2021-02-03 LAB — SARS CORONAVIRUS 2 (TAT 6-24 HRS): SARS Coronavirus 2: NEGATIVE

## 2021-02-05 ENCOUNTER — Ambulatory Visit
Admission: RE | Admit: 2021-02-05 | Discharge: 2021-02-05 | Disposition: A | Discharge status: Home / Self Care | Payer: BC Managed Care – PPO | Source: Ambulatory Visit | Attending: General Surgery | Admitting: General Surgery

## 2021-02-05 ENCOUNTER — Other Ambulatory Visit: Payer: Self-pay | Admitting: General Surgery

## 2021-02-05 ENCOUNTER — Other Ambulatory Visit: Payer: Self-pay

## 2021-02-05 DIAGNOSIS — D242 Benign neoplasm of left breast: Secondary | ICD-10-CM

## 2021-02-05 DIAGNOSIS — N6322 Unspecified lump in the left breast, upper inner quadrant: Secondary | ICD-10-CM | POA: Diagnosis not present

## 2021-02-05 HISTORY — PX: BREAST BIOPSY: SHX20

## 2021-02-06 ENCOUNTER — Ambulatory Visit
Admission: RE | Admit: 2021-02-06 | Discharge: 2021-02-06 | Disposition: A | Discharge status: Home / Self Care | Payer: BC Managed Care – PPO | Source: Ambulatory Visit | Attending: General Surgery | Admitting: General Surgery

## 2021-02-06 ENCOUNTER — Ambulatory Visit (HOSPITAL_BASED_OUTPATIENT_CLINIC_OR_DEPARTMENT_OTHER): Payer: BC Managed Care – PPO | Admitting: Anesthesiology

## 2021-02-06 ENCOUNTER — Other Ambulatory Visit: Payer: Self-pay

## 2021-02-06 ENCOUNTER — Encounter (HOSPITAL_BASED_OUTPATIENT_CLINIC_OR_DEPARTMENT_OTHER)
Admission: RE | Disposition: A | Discharge status: Home / Self Care | Payer: Self-pay | Source: Home / Self Care | Attending: General Surgery

## 2021-02-06 ENCOUNTER — Ambulatory Visit (HOSPITAL_BASED_OUTPATIENT_CLINIC_OR_DEPARTMENT_OTHER)
Admission: RE | Admit: 2021-02-06 | Discharge: 2021-02-06 | Disposition: A | Discharge status: Home / Self Care | Payer: BC Managed Care – PPO | Attending: General Surgery | Admitting: General Surgery

## 2021-02-06 ENCOUNTER — Encounter (HOSPITAL_BASED_OUTPATIENT_CLINIC_OR_DEPARTMENT_OTHER): Payer: Self-pay | Admitting: General Surgery

## 2021-02-06 DIAGNOSIS — R928 Other abnormal and inconclusive findings on diagnostic imaging of breast: Secondary | ICD-10-CM | POA: Diagnosis not present

## 2021-02-06 DIAGNOSIS — Z888 Allergy status to other drugs, medicaments and biological substances status: Secondary | ICD-10-CM | POA: Diagnosis not present

## 2021-02-06 DIAGNOSIS — F419 Anxiety disorder, unspecified: Secondary | ICD-10-CM | POA: Diagnosis not present

## 2021-02-06 DIAGNOSIS — N6012 Diffuse cystic mastopathy of left breast: Secondary | ICD-10-CM | POA: Diagnosis not present

## 2021-02-06 DIAGNOSIS — I10 Essential (primary) hypertension: Secondary | ICD-10-CM | POA: Diagnosis not present

## 2021-02-06 DIAGNOSIS — D242 Benign neoplasm of left breast: Secondary | ICD-10-CM | POA: Insufficient documentation

## 2021-02-06 DIAGNOSIS — Z8249 Family history of ischemic heart disease and other diseases of the circulatory system: Secondary | ICD-10-CM | POA: Diagnosis not present

## 2021-02-06 DIAGNOSIS — Z880 Allergy status to penicillin: Secondary | ICD-10-CM | POA: Insufficient documentation

## 2021-02-06 DIAGNOSIS — Z885 Allergy status to narcotic agent status: Secondary | ICD-10-CM | POA: Diagnosis not present

## 2021-02-06 DIAGNOSIS — Z886 Allergy status to analgesic agent status: Secondary | ICD-10-CM | POA: Diagnosis not present

## 2021-02-06 DIAGNOSIS — Z79899 Other long term (current) drug therapy: Secondary | ICD-10-CM | POA: Diagnosis not present

## 2021-02-06 DIAGNOSIS — Z881 Allergy status to other antibiotic agents status: Secondary | ICD-10-CM | POA: Insufficient documentation

## 2021-02-06 DIAGNOSIS — Z20822 Contact with and (suspected) exposure to covid-19: Secondary | ICD-10-CM | POA: Diagnosis not present

## 2021-02-06 DIAGNOSIS — K649 Unspecified hemorrhoids: Secondary | ICD-10-CM | POA: Diagnosis not present

## 2021-02-06 HISTORY — PX: BREAST LUMPECTOMY WITH RADIOACTIVE SEED LOCALIZATION: SHX6424

## 2021-02-06 HISTORY — DX: Anxiety disorder, unspecified: F41.9

## 2021-02-06 SURGERY — BREAST LUMPECTOMY WITH RADIOACTIVE SEED LOCALIZATION
Anesthesia: General | Site: Breast | Laterality: Left

## 2021-02-06 MED ORDER — DEXAMETHASONE SODIUM PHOSPHATE 4 MG/ML IJ SOLN
INTRAMUSCULAR | Status: DC | PRN
  Administered 2021-02-06: 4 mg via INTRAVENOUS

## 2021-02-06 MED ORDER — PROPOFOL 500 MG/50ML IV EMUL
INTRAVENOUS | Status: DC | PRN
  Administered 2021-02-06: 25 ug/kg/min via INTRAVENOUS

## 2021-02-06 MED ORDER — ACETAMINOPHEN 10 MG/ML IV SOLN: 1000.0000 mg | Freq: Once | INTRAVENOUS | Status: DC | PRN

## 2021-02-06 MED ORDER — ONDANSETRON HCL 4 MG/2ML IJ SOLN: 4.0000 mg | Freq: Once | INTRAMUSCULAR | Status: DC | PRN

## 2021-02-06 MED ORDER — ONDANSETRON HCL 4 MG/2ML IJ SOLN
INTRAMUSCULAR | Status: DC | PRN
  Administered 2021-02-06: 4 mg via INTRAVENOUS

## 2021-02-06 MED ORDER — MORPHINE SULFATE (PF) 10 MG/ML IV SOLN
INTRAVENOUS | Status: DC | PRN
  Administered 2021-02-06: 2 mg via BUCCAL

## 2021-02-06 MED ORDER — KETOROLAC TROMETHAMINE 15 MG/ML IJ SOLN: 15.0000 mg | Freq: Once | INTRAMUSCULAR | Status: DC | PRN

## 2021-02-06 MED ORDER — CHLORHEXIDINE GLUCONATE CLOTH 2 % EX PADS: 6.0000 | MEDICATED_PAD | Freq: Once | CUTANEOUS | Status: DC

## 2021-02-06 MED ORDER — TRAMADOL HCL 50 MG PO TABS: 50.0000 mg | ORAL_TABLET | Freq: Four times a day (QID) | ORAL | 1 refills | Status: AC | PRN

## 2021-02-06 MED ORDER — LACTATED RINGERS IV SOLN: INTRAVENOUS | Status: DC

## 2021-02-06 MED ORDER — BUPIVACAINE-EPINEPHRINE (PF) 0.25% -1:200000 IJ SOLN
INTRAMUSCULAR | Status: DC | PRN
  Administered 2021-02-06: 20 mL

## 2021-02-06 MED ORDER — VANCOMYCIN HCL IN DEXTROSE 1-5 GM/200ML-% IV SOLN
1000.0000 mg | INTRAVENOUS | Status: AC
  Administered 2021-02-06: 1000 mg via INTRAVENOUS

## 2021-02-06 MED ORDER — VANCOMYCIN HCL IN DEXTROSE 1-5 GM/200ML-% IV SOLN
INTRAVENOUS | Status: AC
  Filled 2021-02-06: qty 200

## 2021-02-06 MED ORDER — LIDOCAINE 2% (20 MG/ML) 5 ML SYRINGE
INTRAMUSCULAR | Status: DC | PRN
  Administered 2021-02-06: 60 mg via INTRAVENOUS

## 2021-02-06 MED ORDER — MORPHINE SULFATE (PF) 4 MG/ML IV SOLN
INTRAVENOUS | Status: AC
  Filled 2021-02-06: qty 1

## 2021-02-06 MED ORDER — PROPOFOL 10 MG/ML IV BOLUS
INTRAVENOUS | Status: DC | PRN
  Administered 2021-02-06: 200 mg via INTRAVENOUS

## 2021-02-06 SURGICAL SUPPLY — 42 items
ADH SKN CLS APL DERMABOND .7 (GAUZE/BANDAGES/DRESSINGS) ×1
APL PRP STRL LF DISP 70% ISPRP (MISCELLANEOUS) ×1
APPLIER CLIP 9.375 MED OPEN (MISCELLANEOUS)
APR CLP MED 9.3 20 MLT OPN (MISCELLANEOUS)
BLADE SURG 15 STRL LF DISP TIS (BLADE) ×1 IMPLANT
BLADE SURG 15 STRL SS (BLADE) ×2
CANISTER SUC SOCK COL 7IN (MISCELLANEOUS) IMPLANT
CANISTER SUCT 1200ML W/VALVE (MISCELLANEOUS) IMPLANT
CHLORAPREP W/TINT 26 (MISCELLANEOUS) ×2 IMPLANT
CLIP APPLIE 9.375 MED OPEN (MISCELLANEOUS) IMPLANT
COVER BACK TABLE 60X90IN (DRAPES) ×2 IMPLANT
COVER MAYO STAND STRL (DRAPES) ×2 IMPLANT
COVER PROBE W GEL 5X96 (DRAPES) ×2 IMPLANT
COVER WAND RF STERILE (DRAPES) IMPLANT
DECANTER SPIKE VIAL GLASS SM (MISCELLANEOUS) IMPLANT
DERMABOND ADVANCED (GAUZE/BANDAGES/DRESSINGS) ×1
DERMABOND ADVANCED .7 DNX12 (GAUZE/BANDAGES/DRESSINGS) ×1 IMPLANT
DRAPE LAPAROSCOPIC ABDOMINAL (DRAPES) ×2 IMPLANT
DRAPE UTILITY XL STRL (DRAPES) ×2 IMPLANT
ELECT COATED BLADE 2.86 ST (ELECTRODE) ×2 IMPLANT
ELECT REM PT RETURN 9FT ADLT (ELECTROSURGICAL) ×2
ELECTRODE REM PT RTRN 9FT ADLT (ELECTROSURGICAL) ×1 IMPLANT
GLOVE SURG ENC MOIS LTX SZ7.5 (GLOVE) ×4 IMPLANT
GOWN STRL REUS W/ TWL LRG LVL3 (GOWN DISPOSABLE) ×4 IMPLANT
GOWN STRL REUS W/TWL LRG LVL3 (GOWN DISPOSABLE) ×8
ILLUMINATOR WAVEGUIDE N/F (MISCELLANEOUS) IMPLANT
KIT MARKER MARGIN INK (KITS) ×2 IMPLANT
LIGHT WAVEGUIDE WIDE FLAT (MISCELLANEOUS) IMPLANT
NEEDLE HYPO 25X1 1.5 SAFETY (NEEDLE) ×2 IMPLANT
NS IRRIG 1000ML POUR BTL (IV SOLUTION) IMPLANT
PACK BASIN DAY SURGERY FS (CUSTOM PROCEDURE TRAY) ×2 IMPLANT
PENCIL SMOKE EVACUATOR (MISCELLANEOUS) ×2 IMPLANT
SLEEVE SCD COMPRESS KNEE MED (MISCELLANEOUS) ×2 IMPLANT
SPONGE LAP 18X18 RF (DISPOSABLE) ×2 IMPLANT
SUT MON AB 4-0 PC3 18 (SUTURE) ×4 IMPLANT
SUT SILK 2 0 SH (SUTURE) IMPLANT
SUT VICRYL 3-0 CR8 SH (SUTURE) ×2 IMPLANT
SYR CONTROL 10ML LL (SYRINGE) ×2 IMPLANT
TOWEL GREEN STERILE FF (TOWEL DISPOSABLE) ×2 IMPLANT
TRAY FAXITRON CT DISP (TRAY / TRAY PROCEDURE) ×2 IMPLANT
TUBE CONNECTING 20X1/4 (TUBING) IMPLANT
YANKAUER SUCT BULB TIP NO VENT (SUCTIONS) IMPLANT

## 2021-02-06 NOTE — Interval H&P Note (Signed)
History and Physical Interval Note:  02/06/2021 8:11 AM  Lynn Johns  has presented today for surgery, with the diagnosis of LEFT BREAST PAPILLOMA.  The various methods of treatment have been discussed with the patient and family. After consideration of risks, benefits and other options for treatment, the patient has consented to  Procedure(s): LEFT BREAST LUMPECTOMY WITH RADIOACTIVE SEED LOCALIZATION (Left) as a surgical intervention.  The patient's history has been reviewed, patient examined, no change in status, stable for surgery.  I have reviewed the patient's chart and labs.  Questions were answered to the patient's satisfaction.     Autumn Messing III

## 2021-02-06 NOTE — Anesthesia Postprocedure Evaluation (Signed)
Anesthesia Post Note  Patient: Lynn Johns  Procedure(s) Performed: LEFT BREAST LUMPECTOMY WITH RADIOACTIVE SEED LOCALIZATION (Left Breast)     Patient location during evaluation: PACU Anesthesia Type: General Level of consciousness: awake Pain management: pain level controlled Vital Signs Assessment: post-procedure vital signs reviewed and stable Respiratory status: spontaneous breathing, nonlabored ventilation, respiratory function stable and patient connected to nasal cannula oxygen Cardiovascular status: blood pressure returned to baseline and stable Postop Assessment: no apparent nausea or vomiting Anesthetic complications: no   No complications documented.  Last Vitals:  Vitals:   02/06/21 0945 02/06/21 1002  BP: 116/78 132/90  Pulse: 83 82  Resp: 16 16  Temp:  (!) 36.2 C  SpO2: 100% 100%    Last Pain:  Vitals:   02/06/21 1002  TempSrc:   PainSc: 0-No pain                 Genesee Nase P Xavious Sharrar

## 2021-02-06 NOTE — Discharge Instructions (Signed)

## 2021-02-06 NOTE — Transfer of Care (Signed)
Immediate Anesthesia Transfer of Care Note  Patient: Armine Rizzolo  Procedure(s) Performed: LEFT BREAST LUMPECTOMY WITH RADIOACTIVE SEED LOCALIZATION (Left Breast)  Patient Location: PACU  Anesthesia Type:General  Level of Consciousness: drowsy and patient cooperative  Airway & Oxygen Therapy: Patient Spontanous Breathing and Patient connected to face mask oxygen  Post-op Assessment: Report given to RN and Post -op Vital signs reviewed and stable  Post vital signs: Reviewed and stable  Last Vitals:  Vitals Value Taken Time  BP 113/80 02/06/21 0927  Temp    Pulse 88 02/06/21 0928  Resp 19 02/06/21 0928  SpO2 100 % 02/06/21 0928  Vitals shown include unvalidated device data.  Last Pain:  Vitals:   02/06/21 0705  TempSrc: Oral  PainSc: 3       Patients Stated Pain Goal: 9 (31/49/70 2637)  Complications: No complications documented.

## 2021-02-06 NOTE — H&P (Signed)
Lynn Johns  Location: [facility] Patient #: [id number] DOB: [birth year] Married / Language: English / Race: White Female   History of Present Illness  The patient is a 70 year old female who presents with a breast mass. We are asked to see the patient in consultation by Dr. Shon Hale to evaluate her for a papilloma of the left breast. The patient is a 70 year old white female who recently went for a routine screening mammogram. At that time she was found to have a 6 mm mass upper inner quadrant with some associated calcification. This was biopsied and came back as an intraductal papilloma. The radiologist felt that this was concordant but recommended excision because of its appearance. She has a very serious concerns about undergoing anesthesia as she had a bad experience in the past. She states that she has significant side effect reactions to fentanyl and its derivatives   Past Surgical History  Cesarean Section - Multiple  Colon Polyp Removal - Colonoscopy  Foot Surgery  Bilateral. Hysterectomy (not due to cancer) - Complete  Knee Surgery  Left. Shoulder Surgery  Bilateral. Spinal Surgery - Lower Back  Spinal Surgery - Neck   Diagnostic Studies History  Colonoscopy  1-5 years ago Mammogram  within last year Pap Smear  never  Allergies  NSAIDs  Penicillins  fentaNYL *ANALGESICS - OPIOID*  Valium *ANTIANXIETY AGENTS*  Cipro *FLUOROQUINOLONES*  Clindamycin HCl *ANTI-INFECTIVE AGENTS - MISC.*  Allergies Reconciled   Medication History Carvedilol Phosphate ER (10MG  Capsule ER 24HR, Oral) Active. Aldactone (50MG  Tablet, Oral) Active. Losartan Potassium (25MG  Tablet, Oral) Active. Potassium (75MG  Tablet, Oral) Active.  Social History Alcohol use  Occasional alcohol use. Caffeine use  Carbonated beverages. No drug use  Tobacco use  Never smoker.  Family History  Hypertension  Father, Son.  Pregnancy / Birth History Age at  menarche  21 years. Age of menopause  [age] Contraceptive History  Oral contraceptives. Gravida  4 Irregular periods  Length (months) of breastfeeding  3-6  Other Problems  Arthritis  Diverticulosis  General anesthesia - complications  High blood pressure     Review of Systems  General Not Present- Appetite Loss, Chills, Fatigue, Fever, Night Sweats, Weight Gain and Weight Loss. Skin Not Present- Change in Wart/Mole, Dryness, Hives, Jaundice, New Lesions, Non-Healing Wounds, Rash and Ulcer. Respiratory Not Present- Bloody sputum, Chronic Cough, Difficulty Breathing, Snoring and Wheezing. Breast Not Present- Breast Mass, Breast Pain, Nipple Discharge and Skin Changes. Cardiovascular Not Present- Chest Pain, Difficulty Breathing Lying Down, Leg Cramps, Palpitations, Rapid Heart Rate, Shortness of Breath and Swelling of Extremities. Gastrointestinal Not Present- Abdominal Pain, Bloating, Bloody Stool, Change in Bowel Habits, Chronic diarrhea, Constipation, Difficulty Swallowing, Excessive gas, Gets full quickly at meals, Hemorrhoids, Indigestion, Nausea, Rectal Pain and Vomiting. Female Genitourinary Not Present- Frequency, Nocturia, Painful Urination, Pelvic Pain and Urgency. Musculoskeletal Present- Joint Pain and Muscle Pain. Not Present- Back Pain, Joint Stiffness, Muscle Weakness and Swelling of Extremities. Neurological Not Present- Decreased Memory, Fainting, Headaches, Numbness, Seizures, Tingling, Tremor, Trouble walking and Weakness. Hematology Not Present- Blood Thinners, Easy Bruising, Excessive bleeding, Gland problems, HIV and Persistent Infections.  Vitals  Weight: 141 lb Height: 63in Body Surface Area: 1.67 m Body Mass Index: 24.98 kg/m  Temp.: 98.43F  Pulse: 114 (Regular)  P.OX: 99% (Room air) BP: 126/82(Sitting, Left Arm, Standard)       Physical Exam  General Mental Status-Alert. General Appearance-Consistent with stated  age. Hydration-Well hydrated. Voice-Normal.  Head and Neck Head-normocephalic, atraumatic with no  lesions or palpable masses. Trachea-midline. Thyroid Gland Characteristics - normal size and consistency.  Eye Eyeball - Bilateral-Extraocular movements intact. Sclera/Conjunctiva - Bilateral-No scleral icterus.  Chest and Lung Exam Chest and lung exam reveals -quiet, even and easy respiratory effort with no use of accessory muscles and on auscultation, normal breath sounds, no adventitious sounds and normal vocal resonance. Inspection Chest Wall - Normal. Back - normal.  Breast Note: There is no palpable mass in either breast. There is no palpable axillary, supraclavicular, or cervical lymphadenopathy   Cardiovascular Cardiovascular examination reveals -normal heart sounds, regular rate and rhythm with no murmurs and normal pedal pulses bilaterally.  Abdomen Inspection Inspection of the abdomen reveals - No Hernias. Skin - Scar - no surgical scars. Palpation/Percussion Palpation and Percussion of the abdomen reveal - Soft, Non Tender, No Rebound tenderness, No Rigidity (guarding) and No hepatosplenomegaly. Auscultation Auscultation of the abdomen reveals - Bowel sounds normal.  Neurologic Neurologic evaluation reveals -alert and oriented x 3 with no impairment of recent or remote memory. Mental Status-Normal.  Musculoskeletal Normal Exam - Left-Upper Extremity Strength Normal and Lower Extremity Strength Normal. Normal Exam - Right-Upper Extremity Strength Normal and Lower Extremity Strength Normal.  Lymphatic Head & Neck  General Head & Neck Lymphatics: Bilateral - Description - Normal. Axillary  General Axillary Region: Bilateral - Description - Normal. Tenderness - Non Tender. Femoral & Inguinal  Generalized Femoral & Inguinal Lymphatics: Bilateral - Description - Normal. Tenderness - Non Tender.    Assessment & Plan  INTRADUCTAL  PAPILLOMA OF BREAST, LEFT (D24.2) Impression: The patient appears to have a 6 mm intraductal papilloma in the upper inner quadrant of the left breast. Because of its abnormal appearance and because there is a 5-10% chance of missing something more significant the recommendation is to have this area removed. I have discussed with her in detail the risks and benefits of the operation to do this as well as some of the technical aspects including the use of a radioactive seed for localization and she understands and wishes to proceed. She is very concerned about the general anesthesia and wants to avoid fentanyl and its derivatives that all cost. We will obtain a preoperative anesthesia consult to try to make sure that this happens. This patient encounter took 30 minutes today to perform the following: take history, perform exam, review outside records, interpret imaging, counsel the patient on their diagnosis and document encounter, findings & plan in the EHR

## 2021-02-06 NOTE — Anesthesia Preprocedure Evaluation (Addendum)
Anesthesia Evaluation  Patient identified by MRN, date of birth, ID band Patient awake    Reviewed: Allergy & Precautions, NPO status , Patient's Chart, lab work & pertinent test results  History of Anesthesia Complications (+) PONV and history of anesthetic complications  Airway Mallampati: II  TM Distance: >3 FB Neck ROM: Full    Dental no notable dental hx.    Pulmonary neg pulmonary ROS,    Pulmonary exam normal breath sounds clear to auscultation       Cardiovascular hypertension, Pt. on home beta blockers and Pt. on medications Normal cardiovascular exam Rhythm:Regular Rate:Normal  ECG: rate 95.  Normal sinus rhythm Left axis deviation   Neuro/Psych Anxiety negative neurological ROS     GI/Hepatic negative GI ROS, Neg liver ROS,   Endo/Other  negative endocrine ROS  Renal/GU Renal disease     Musculoskeletal  (+) Arthritis ,   Abdominal   Peds  Hematology negative hematology ROS (+)   Anesthesia Other Findings LEFT BREAST PAPILLOMA  Reproductive/Obstetrics                            Anesthesia Physical Anesthesia Plan  ASA: II  Anesthesia Plan: General   Post-op Pain Management:    Induction: Intravenous  PONV Risk Score and Plan: 4 or greater and Ondansetron, Dexamethasone, Amisulpride, Treatment may vary due to age or medical condition and Propofol infusion  Airway Management Planned: LMA  Additional Equipment:   Intra-op Plan:   Post-operative Plan: Extubation in OR  Informed Consent: I have reviewed the patients History and Physical, chart, labs and discussed the procedure including the risks, benefits and alternatives for the proposed anesthesia with the patient or authorized representative who has indicated his/her understanding and acceptance.     Dental advisory given  Plan Discussed with: CRNA  Anesthesia Plan Comments:        Anesthesia Quick  Evaluation

## 2021-02-06 NOTE — Op Note (Signed)
02/06/2021  9:21 AM  PATIENT:  Lynn Johns  70 y.o. female  PRE-OPERATIVE DIAGNOSIS:  LEFT BREAST PAPILLOMA  POST-OPERATIVE DIAGNOSIS:  LEFT BREAST PAPILLOMA  PROCEDURE:  Procedure(s): LEFT BREAST LUMPECTOMY WITH RADIOACTIVE SEED LOCALIZATION (Left)  SURGEON:  Surgeon(s) and Role:    Jovita Kussmaul, MD - Primary  PHYSICIAN ASSISTANT:   ASSISTANTS: Dr. Toma Aran   ANESTHESIA:   local and general  EBL:  minimal   BLOOD ADMINISTERED:none  DRAINS: none   LOCAL MEDICATIONS USED:  MARCAINE     SPECIMEN:  Source of Specimen:  left breast tissue  DISPOSITION OF SPECIMEN:  PATHOLOGY  COUNTS:  YES  TOURNIQUET:  * No tourniquets in log *  DICTATION: .Dragon Dictation   After informed consent was obtained the patient was brought to the operating room and placed in the supine position on the operating table. After adequate induction of general anesthesia the patient's left breast was prepped with ChloraPrep, allowed to dry, and draped in usual sterile manner. An appropriate timeout was performed. Previously an I-125 seed was placed in the upper inner quadrant of the left breast to mark an area of intraductal papilloma. The neoprobe was set to I-125 in the area of radioactivity was readily identified. The area around this was infiltrated with quarter percent Marcaine. A curvilinear incision was made along the upper inner edge of the areola of the left breast with a 15 blade knife. The incision was carried through the skin and subcutaneous tissue sharply with the electrocautery. Dissection was then carried towards the radioactive seed under the direction of the neoprobe. Once I more closely approached the radioactive seed I then removed a circular portion of breast tissue sharply around the radioactive seed while checking the area of radioactivity frequently. Once the specimen was removed it was oriented with the appropriate paint colors. A specimen radiograph was obtained that  showed the seed to be near the center of the specimen. The specimen was then sent to pathology for further evaluation. Hemostasis was achieved using the Bovie electrocautery. The wound was infiltrated with more quarter percent Marcaine. The deep layer of the wound was then closed with interrupted 3-0 Vicryl stitches. The skin was closed with interrupted 4-0 Monocryl subcuticular stitches. Dermabond dressings were applied. The patient tolerated the procedure well. At the end of the case all needle sponge and instrument counts were correct. The patient was then awakened and taken to recovery in stable condition.  PLAN OF CARE: Discharge to home after PACU  PATIENT DISPOSITION:  PACU - hemodynamically stable.   Delay start of Pharmacological VTE agent (>24hrs) due to surgical blood loss or risk of bleeding: not applicable

## 2021-02-06 NOTE — Anesthesia Procedure Notes (Signed)
Procedure Name: LMA Insertion Date/Time: 02/06/2021 8:34 AM Performed by: Signe Colt, CRNA Pre-anesthesia Checklist: Patient identified, Emergency Drugs available, Suction available and Patient being monitored Patient Re-evaluated:Patient Re-evaluated prior to induction Oxygen Delivery Method: Circle System Utilized Preoxygenation: Pre-oxygenation with 100% oxygen Induction Type: IV induction Ventilation: Mask ventilation without difficulty LMA: LMA inserted LMA Size: 4.0 Number of attempts: 1 Airway Equipment and Method: bite block Placement Confirmation: positive ETCO2 Tube secured with: Tape Dental Injury: Teeth and Oropharynx as per pre-operative assessment

## 2021-02-09 ENCOUNTER — Encounter (HOSPITAL_BASED_OUTPATIENT_CLINIC_OR_DEPARTMENT_OTHER): Payer: Self-pay | Admitting: General Surgery

## 2021-02-09 LAB — SURGICAL PATHOLOGY

## 2021-02-13 DIAGNOSIS — M542 Cervicalgia: Secondary | ICD-10-CM | POA: Diagnosis not present

## 2021-02-13 DIAGNOSIS — M25511 Pain in right shoulder: Secondary | ICD-10-CM | POA: Diagnosis not present

## 2021-02-13 DIAGNOSIS — M25512 Pain in left shoulder: Secondary | ICD-10-CM | POA: Diagnosis not present

## 2021-02-13 DIAGNOSIS — M25611 Stiffness of right shoulder, not elsewhere classified: Secondary | ICD-10-CM | POA: Diagnosis not present

## 2021-02-25 DIAGNOSIS — M25511 Pain in right shoulder: Secondary | ICD-10-CM | POA: Diagnosis not present

## 2021-02-25 DIAGNOSIS — M25611 Stiffness of right shoulder, not elsewhere classified: Secondary | ICD-10-CM | POA: Diagnosis not present

## 2021-02-25 DIAGNOSIS — M542 Cervicalgia: Secondary | ICD-10-CM | POA: Diagnosis not present

## 2021-02-25 DIAGNOSIS — M25512 Pain in left shoulder: Secondary | ICD-10-CM | POA: Diagnosis not present

## 2021-03-05 DIAGNOSIS — M25512 Pain in left shoulder: Secondary | ICD-10-CM | POA: Diagnosis not present

## 2021-03-05 DIAGNOSIS — M25611 Stiffness of right shoulder, not elsewhere classified: Secondary | ICD-10-CM | POA: Diagnosis not present

## 2021-03-05 DIAGNOSIS — M542 Cervicalgia: Secondary | ICD-10-CM | POA: Diagnosis not present

## 2021-03-05 DIAGNOSIS — M25511 Pain in right shoulder: Secondary | ICD-10-CM | POA: Diagnosis not present

## 2021-03-13 DIAGNOSIS — M25511 Pain in right shoulder: Secondary | ICD-10-CM | POA: Diagnosis not present

## 2021-03-13 DIAGNOSIS — M542 Cervicalgia: Secondary | ICD-10-CM | POA: Diagnosis not present

## 2021-03-13 DIAGNOSIS — M25512 Pain in left shoulder: Secondary | ICD-10-CM | POA: Diagnosis not present

## 2021-03-13 DIAGNOSIS — M25611 Stiffness of right shoulder, not elsewhere classified: Secondary | ICD-10-CM | POA: Diagnosis not present

## 2021-03-19 DIAGNOSIS — M25511 Pain in right shoulder: Secondary | ICD-10-CM | POA: Diagnosis not present

## 2021-03-19 DIAGNOSIS — M25611 Stiffness of right shoulder, not elsewhere classified: Secondary | ICD-10-CM | POA: Diagnosis not present

## 2021-03-19 DIAGNOSIS — M25512 Pain in left shoulder: Secondary | ICD-10-CM | POA: Diagnosis not present

## 2021-03-19 DIAGNOSIS — M542 Cervicalgia: Secondary | ICD-10-CM | POA: Diagnosis not present

## 2021-03-27 DIAGNOSIS — M25511 Pain in right shoulder: Secondary | ICD-10-CM | POA: Diagnosis not present

## 2021-03-27 DIAGNOSIS — M542 Cervicalgia: Secondary | ICD-10-CM | POA: Diagnosis not present

## 2021-03-27 DIAGNOSIS — M25611 Stiffness of right shoulder, not elsewhere classified: Secondary | ICD-10-CM | POA: Diagnosis not present

## 2021-03-27 DIAGNOSIS — M25512 Pain in left shoulder: Secondary | ICD-10-CM | POA: Diagnosis not present

## 2021-04-02 DIAGNOSIS — M25611 Stiffness of right shoulder, not elsewhere classified: Secondary | ICD-10-CM | POA: Diagnosis not present

## 2021-04-02 DIAGNOSIS — M25512 Pain in left shoulder: Secondary | ICD-10-CM | POA: Diagnosis not present

## 2021-04-02 DIAGNOSIS — M25511 Pain in right shoulder: Secondary | ICD-10-CM | POA: Diagnosis not present

## 2021-04-02 DIAGNOSIS — M542 Cervicalgia: Secondary | ICD-10-CM | POA: Diagnosis not present

## 2021-04-07 DIAGNOSIS — M13861 Other specified arthritis, right knee: Secondary | ICD-10-CM | POA: Diagnosis not present

## 2021-04-14 DIAGNOSIS — M13861 Other specified arthritis, right knee: Secondary | ICD-10-CM | POA: Diagnosis not present

## 2021-04-15 DIAGNOSIS — M25511 Pain in right shoulder: Secondary | ICD-10-CM | POA: Diagnosis not present

## 2021-04-15 DIAGNOSIS — M25512 Pain in left shoulder: Secondary | ICD-10-CM | POA: Diagnosis not present

## 2021-04-15 DIAGNOSIS — M542 Cervicalgia: Secondary | ICD-10-CM | POA: Diagnosis not present

## 2021-04-15 DIAGNOSIS — M25611 Stiffness of right shoulder, not elsewhere classified: Secondary | ICD-10-CM | POA: Diagnosis not present

## 2021-04-22 DIAGNOSIS — J309 Allergic rhinitis, unspecified: Secondary | ICD-10-CM | POA: Diagnosis not present

## 2021-04-23 DIAGNOSIS — M13861 Other specified arthritis, right knee: Secondary | ICD-10-CM | POA: Diagnosis not present

## 2021-04-24 DIAGNOSIS — M542 Cervicalgia: Secondary | ICD-10-CM | POA: Diagnosis not present

## 2021-04-24 DIAGNOSIS — M25512 Pain in left shoulder: Secondary | ICD-10-CM | POA: Diagnosis not present

## 2021-04-24 DIAGNOSIS — M25611 Stiffness of right shoulder, not elsewhere classified: Secondary | ICD-10-CM | POA: Diagnosis not present

## 2021-04-24 DIAGNOSIS — M25511 Pain in right shoulder: Secondary | ICD-10-CM | POA: Diagnosis not present

## 2021-05-01 DIAGNOSIS — M542 Cervicalgia: Secondary | ICD-10-CM | POA: Diagnosis not present

## 2021-05-01 DIAGNOSIS — M25512 Pain in left shoulder: Secondary | ICD-10-CM | POA: Diagnosis not present

## 2021-05-01 DIAGNOSIS — M25611 Stiffness of right shoulder, not elsewhere classified: Secondary | ICD-10-CM | POA: Diagnosis not present

## 2021-05-01 DIAGNOSIS — M25511 Pain in right shoulder: Secondary | ICD-10-CM | POA: Diagnosis not present

## 2021-05-08 DIAGNOSIS — M25611 Stiffness of right shoulder, not elsewhere classified: Secondary | ICD-10-CM | POA: Diagnosis not present

## 2021-05-08 DIAGNOSIS — M542 Cervicalgia: Secondary | ICD-10-CM | POA: Diagnosis not present

## 2021-05-08 DIAGNOSIS — M25511 Pain in right shoulder: Secondary | ICD-10-CM | POA: Diagnosis not present

## 2021-05-08 DIAGNOSIS — M25512 Pain in left shoulder: Secondary | ICD-10-CM | POA: Diagnosis not present

## 2021-05-21 DIAGNOSIS — M25512 Pain in left shoulder: Secondary | ICD-10-CM | POA: Diagnosis not present

## 2021-05-21 DIAGNOSIS — M25511 Pain in right shoulder: Secondary | ICD-10-CM | POA: Diagnosis not present

## 2021-05-21 DIAGNOSIS — M25611 Stiffness of right shoulder, not elsewhere classified: Secondary | ICD-10-CM | POA: Diagnosis not present

## 2021-05-21 DIAGNOSIS — M542 Cervicalgia: Secondary | ICD-10-CM | POA: Diagnosis not present

## 2021-05-28 DIAGNOSIS — M25512 Pain in left shoulder: Secondary | ICD-10-CM | POA: Diagnosis not present

## 2021-05-28 DIAGNOSIS — M25611 Stiffness of right shoulder, not elsewhere classified: Secondary | ICD-10-CM | POA: Diagnosis not present

## 2021-05-28 DIAGNOSIS — M542 Cervicalgia: Secondary | ICD-10-CM | POA: Diagnosis not present

## 2021-05-28 DIAGNOSIS — M25511 Pain in right shoulder: Secondary | ICD-10-CM | POA: Diagnosis not present

## 2021-06-05 DIAGNOSIS — M542 Cervicalgia: Secondary | ICD-10-CM | POA: Diagnosis not present

## 2021-06-05 DIAGNOSIS — M25512 Pain in left shoulder: Secondary | ICD-10-CM | POA: Diagnosis not present

## 2021-06-05 DIAGNOSIS — M25611 Stiffness of right shoulder, not elsewhere classified: Secondary | ICD-10-CM | POA: Diagnosis not present

## 2021-06-05 DIAGNOSIS — M25511 Pain in right shoulder: Secondary | ICD-10-CM | POA: Diagnosis not present

## 2021-06-18 DIAGNOSIS — Z96611 Presence of right artificial shoulder joint: Secondary | ICD-10-CM | POA: Diagnosis not present

## 2021-06-18 DIAGNOSIS — M1711 Unilateral primary osteoarthritis, right knee: Secondary | ICD-10-CM | POA: Diagnosis not present

## 2021-06-19 DIAGNOSIS — M25611 Stiffness of right shoulder, not elsewhere classified: Secondary | ICD-10-CM | POA: Diagnosis not present

## 2021-06-19 DIAGNOSIS — M25512 Pain in left shoulder: Secondary | ICD-10-CM | POA: Diagnosis not present

## 2021-06-19 DIAGNOSIS — M25511 Pain in right shoulder: Secondary | ICD-10-CM | POA: Diagnosis not present

## 2021-06-19 DIAGNOSIS — M542 Cervicalgia: Secondary | ICD-10-CM | POA: Diagnosis not present

## 2021-07-02 DIAGNOSIS — M5136 Other intervertebral disc degeneration, lumbar region: Secondary | ICD-10-CM | POA: Diagnosis not present

## 2021-07-02 DIAGNOSIS — M2550 Pain in unspecified joint: Secondary | ICD-10-CM | POA: Diagnosis not present

## 2021-07-02 DIAGNOSIS — M150 Primary generalized (osteo)arthritis: Secondary | ICD-10-CM | POA: Diagnosis not present

## 2021-07-02 DIAGNOSIS — M5030 Other cervical disc degeneration, unspecified cervical region: Secondary | ICD-10-CM | POA: Diagnosis not present

## 2021-07-03 DIAGNOSIS — M25611 Stiffness of right shoulder, not elsewhere classified: Secondary | ICD-10-CM | POA: Diagnosis not present

## 2021-07-03 DIAGNOSIS — M25512 Pain in left shoulder: Secondary | ICD-10-CM | POA: Diagnosis not present

## 2021-07-03 DIAGNOSIS — M542 Cervicalgia: Secondary | ICD-10-CM | POA: Diagnosis not present

## 2021-07-03 DIAGNOSIS — M25511 Pain in right shoulder: Secondary | ICD-10-CM | POA: Diagnosis not present

## 2021-07-17 DIAGNOSIS — M25512 Pain in left shoulder: Secondary | ICD-10-CM | POA: Diagnosis not present

## 2021-07-17 DIAGNOSIS — M25511 Pain in right shoulder: Secondary | ICD-10-CM | POA: Diagnosis not present

## 2021-07-17 DIAGNOSIS — M25611 Stiffness of right shoulder, not elsewhere classified: Secondary | ICD-10-CM | POA: Diagnosis not present

## 2021-07-17 DIAGNOSIS — M542 Cervicalgia: Secondary | ICD-10-CM | POA: Diagnosis not present

## 2021-07-21 DIAGNOSIS — J0110 Acute frontal sinusitis, unspecified: Secondary | ICD-10-CM | POA: Diagnosis not present

## 2021-07-31 DIAGNOSIS — M542 Cervicalgia: Secondary | ICD-10-CM | POA: Diagnosis not present

## 2021-07-31 DIAGNOSIS — M25511 Pain in right shoulder: Secondary | ICD-10-CM | POA: Diagnosis not present

## 2021-07-31 DIAGNOSIS — M25611 Stiffness of right shoulder, not elsewhere classified: Secondary | ICD-10-CM | POA: Diagnosis not present

## 2021-07-31 DIAGNOSIS — M25512 Pain in left shoulder: Secondary | ICD-10-CM | POA: Diagnosis not present

## 2021-08-14 DIAGNOSIS — M25511 Pain in right shoulder: Secondary | ICD-10-CM | POA: Diagnosis not present

## 2021-08-14 DIAGNOSIS — M25512 Pain in left shoulder: Secondary | ICD-10-CM | POA: Diagnosis not present

## 2021-08-14 DIAGNOSIS — M25611 Stiffness of right shoulder, not elsewhere classified: Secondary | ICD-10-CM | POA: Diagnosis not present

## 2021-08-14 DIAGNOSIS — M542 Cervicalgia: Secondary | ICD-10-CM | POA: Diagnosis not present

## 2021-08-31 DIAGNOSIS — M25511 Pain in right shoulder: Secondary | ICD-10-CM | POA: Diagnosis not present

## 2021-08-31 DIAGNOSIS — M25512 Pain in left shoulder: Secondary | ICD-10-CM | POA: Diagnosis not present

## 2021-08-31 DIAGNOSIS — M25611 Stiffness of right shoulder, not elsewhere classified: Secondary | ICD-10-CM | POA: Diagnosis not present

## 2021-08-31 DIAGNOSIS — M542 Cervicalgia: Secondary | ICD-10-CM | POA: Diagnosis not present

## 2021-09-15 DIAGNOSIS — M25512 Pain in left shoulder: Secondary | ICD-10-CM | POA: Diagnosis not present

## 2021-09-15 DIAGNOSIS — M25611 Stiffness of right shoulder, not elsewhere classified: Secondary | ICD-10-CM | POA: Diagnosis not present

## 2021-09-15 DIAGNOSIS — M542 Cervicalgia: Secondary | ICD-10-CM | POA: Diagnosis not present

## 2021-09-15 DIAGNOSIS — M25511 Pain in right shoulder: Secondary | ICD-10-CM | POA: Diagnosis not present

## 2021-10-04 DIAGNOSIS — J0100 Acute maxillary sinusitis, unspecified: Secondary | ICD-10-CM | POA: Diagnosis not present

## 2021-10-05 ENCOUNTER — Other Ambulatory Visit: Payer: Self-pay | Admitting: Family Medicine

## 2021-10-05 DIAGNOSIS — Z1231 Encounter for screening mammogram for malignant neoplasm of breast: Secondary | ICD-10-CM

## 2021-10-09 DIAGNOSIS — M25512 Pain in left shoulder: Secondary | ICD-10-CM | POA: Diagnosis not present

## 2021-10-09 DIAGNOSIS — M25611 Stiffness of right shoulder, not elsewhere classified: Secondary | ICD-10-CM | POA: Diagnosis not present

## 2021-10-09 DIAGNOSIS — M25511 Pain in right shoulder: Secondary | ICD-10-CM | POA: Diagnosis not present

## 2021-10-09 DIAGNOSIS — M542 Cervicalgia: Secondary | ICD-10-CM | POA: Diagnosis not present

## 2021-10-20 DIAGNOSIS — H5010 Unspecified exotropia: Secondary | ICD-10-CM | POA: Diagnosis not present

## 2021-10-20 DIAGNOSIS — H52203 Unspecified astigmatism, bilateral: Secondary | ICD-10-CM | POA: Diagnosis not present

## 2021-10-20 DIAGNOSIS — H2513 Age-related nuclear cataract, bilateral: Secondary | ICD-10-CM | POA: Diagnosis not present

## 2021-10-20 DIAGNOSIS — H353131 Nonexudative age-related macular degeneration, bilateral, early dry stage: Secondary | ICD-10-CM | POA: Diagnosis not present

## 2021-10-28 DIAGNOSIS — M1711 Unilateral primary osteoarthritis, right knee: Secondary | ICD-10-CM | POA: Diagnosis not present

## 2021-11-02 DIAGNOSIS — Z01419 Encounter for gynecological examination (general) (routine) without abnormal findings: Secondary | ICD-10-CM | POA: Diagnosis not present

## 2021-11-02 DIAGNOSIS — Z6824 Body mass index (BMI) 24.0-24.9, adult: Secondary | ICD-10-CM | POA: Diagnosis not present

## 2021-11-04 DIAGNOSIS — J0100 Acute maxillary sinusitis, unspecified: Secondary | ICD-10-CM | POA: Diagnosis not present

## 2021-11-04 DIAGNOSIS — M1711 Unilateral primary osteoarthritis, right knee: Secondary | ICD-10-CM | POA: Diagnosis not present

## 2021-11-09 ENCOUNTER — Ambulatory Visit
Admission: RE | Admit: 2021-11-09 | Discharge: 2021-11-09 | Disposition: A | Discharge status: Home / Self Care | Payer: BC Managed Care – PPO | Source: Ambulatory Visit | Attending: Family Medicine | Admitting: Family Medicine

## 2021-11-09 ENCOUNTER — Other Ambulatory Visit: Payer: Self-pay

## 2021-11-09 DIAGNOSIS — Z1231 Encounter for screening mammogram for malignant neoplasm of breast: Secondary | ICD-10-CM

## 2021-11-10 DIAGNOSIS — M13861 Other specified arthritis, right knee: Secondary | ICD-10-CM | POA: Diagnosis not present

## 2021-11-11 DIAGNOSIS — E876 Hypokalemia: Secondary | ICD-10-CM | POA: Diagnosis not present

## 2021-11-11 DIAGNOSIS — D72829 Elevated white blood cell count, unspecified: Secondary | ICD-10-CM | POA: Diagnosis not present

## 2021-11-11 DIAGNOSIS — Z0000 Encounter for general adult medical examination without abnormal findings: Secondary | ICD-10-CM | POA: Diagnosis not present

## 2021-11-11 DIAGNOSIS — Z13220 Encounter for screening for lipoid disorders: Secondary | ICD-10-CM | POA: Diagnosis not present

## 2021-11-11 DIAGNOSIS — I10 Essential (primary) hypertension: Secondary | ICD-10-CM | POA: Diagnosis not present

## 2021-11-16 DIAGNOSIS — M542 Cervicalgia: Secondary | ICD-10-CM | POA: Diagnosis not present

## 2021-11-16 DIAGNOSIS — M25511 Pain in right shoulder: Secondary | ICD-10-CM | POA: Diagnosis not present

## 2021-11-16 DIAGNOSIS — M25611 Stiffness of right shoulder, not elsewhere classified: Secondary | ICD-10-CM | POA: Diagnosis not present

## 2021-11-16 DIAGNOSIS — M25512 Pain in left shoulder: Secondary | ICD-10-CM | POA: Diagnosis not present

## 2021-12-18 DIAGNOSIS — M25561 Pain in right knee: Secondary | ICD-10-CM | POA: Diagnosis not present

## 2021-12-18 DIAGNOSIS — M542 Cervicalgia: Secondary | ICD-10-CM | POA: Diagnosis not present

## 2021-12-18 DIAGNOSIS — M25512 Pain in left shoulder: Secondary | ICD-10-CM | POA: Diagnosis not present

## 2021-12-18 DIAGNOSIS — M6281 Muscle weakness (generalized): Secondary | ICD-10-CM | POA: Diagnosis not present

## 2021-12-21 DIAGNOSIS — I10 Essential (primary) hypertension: Secondary | ICD-10-CM | POA: Diagnosis not present

## 2021-12-28 DIAGNOSIS — M6281 Muscle weakness (generalized): Secondary | ICD-10-CM | POA: Diagnosis not present

## 2021-12-28 DIAGNOSIS — M25561 Pain in right knee: Secondary | ICD-10-CM | POA: Diagnosis not present

## 2021-12-28 DIAGNOSIS — M25512 Pain in left shoulder: Secondary | ICD-10-CM | POA: Diagnosis not present

## 2021-12-28 DIAGNOSIS — M542 Cervicalgia: Secondary | ICD-10-CM | POA: Diagnosis not present

## 2022-01-08 DIAGNOSIS — M6281 Muscle weakness (generalized): Secondary | ICD-10-CM | POA: Diagnosis not present

## 2022-01-08 DIAGNOSIS — M25512 Pain in left shoulder: Secondary | ICD-10-CM | POA: Diagnosis not present

## 2022-01-08 DIAGNOSIS — M542 Cervicalgia: Secondary | ICD-10-CM | POA: Diagnosis not present

## 2022-01-08 DIAGNOSIS — M25561 Pain in right knee: Secondary | ICD-10-CM | POA: Diagnosis not present

## 2022-01-25 DIAGNOSIS — M6281 Muscle weakness (generalized): Secondary | ICD-10-CM | POA: Diagnosis not present

## 2022-01-25 DIAGNOSIS — M542 Cervicalgia: Secondary | ICD-10-CM | POA: Diagnosis not present

## 2022-01-25 DIAGNOSIS — M25561 Pain in right knee: Secondary | ICD-10-CM | POA: Diagnosis not present

## 2022-01-25 DIAGNOSIS — M25512 Pain in left shoulder: Secondary | ICD-10-CM | POA: Diagnosis not present

## 2022-02-11 DIAGNOSIS — M542 Cervicalgia: Secondary | ICD-10-CM | POA: Diagnosis not present

## 2022-02-11 DIAGNOSIS — M25512 Pain in left shoulder: Secondary | ICD-10-CM | POA: Diagnosis not present

## 2022-02-11 DIAGNOSIS — M25561 Pain in right knee: Secondary | ICD-10-CM | POA: Diagnosis not present

## 2022-02-11 DIAGNOSIS — M6281 Muscle weakness (generalized): Secondary | ICD-10-CM | POA: Diagnosis not present

## 2022-02-25 DIAGNOSIS — M542 Cervicalgia: Secondary | ICD-10-CM | POA: Diagnosis not present

## 2022-02-25 DIAGNOSIS — M6281 Muscle weakness (generalized): Secondary | ICD-10-CM | POA: Diagnosis not present

## 2022-02-25 DIAGNOSIS — M25512 Pain in left shoulder: Secondary | ICD-10-CM | POA: Diagnosis not present

## 2022-02-25 DIAGNOSIS — M25561 Pain in right knee: Secondary | ICD-10-CM | POA: Diagnosis not present

## 2022-03-10 DIAGNOSIS — M6281 Muscle weakness (generalized): Secondary | ICD-10-CM | POA: Diagnosis not present

## 2022-03-10 DIAGNOSIS — M25512 Pain in left shoulder: Secondary | ICD-10-CM | POA: Diagnosis not present

## 2022-03-10 DIAGNOSIS — M25561 Pain in right knee: Secondary | ICD-10-CM | POA: Diagnosis not present

## 2022-03-10 DIAGNOSIS — M542 Cervicalgia: Secondary | ICD-10-CM | POA: Diagnosis not present

## 2022-03-22 DIAGNOSIS — M6281 Muscle weakness (generalized): Secondary | ICD-10-CM | POA: Diagnosis not present

## 2022-03-22 DIAGNOSIS — M542 Cervicalgia: Secondary | ICD-10-CM | POA: Diagnosis not present

## 2022-03-22 DIAGNOSIS — M25561 Pain in right knee: Secondary | ICD-10-CM | POA: Diagnosis not present

## 2022-03-22 DIAGNOSIS — M25512 Pain in left shoulder: Secondary | ICD-10-CM | POA: Diagnosis not present

## 2022-04-07 DIAGNOSIS — M25561 Pain in right knee: Secondary | ICD-10-CM | POA: Diagnosis not present

## 2022-04-07 DIAGNOSIS — M542 Cervicalgia: Secondary | ICD-10-CM | POA: Diagnosis not present

## 2022-04-07 DIAGNOSIS — M25512 Pain in left shoulder: Secondary | ICD-10-CM | POA: Diagnosis not present

## 2022-04-07 DIAGNOSIS — M6281 Muscle weakness (generalized): Secondary | ICD-10-CM | POA: Diagnosis not present

## 2022-04-22 ENCOUNTER — Encounter (HOSPITAL_COMMUNITY): Payer: Self-pay

## 2022-04-23 DIAGNOSIS — M6281 Muscle weakness (generalized): Secondary | ICD-10-CM | POA: Diagnosis not present

## 2022-04-23 DIAGNOSIS — M25561 Pain in right knee: Secondary | ICD-10-CM | POA: Diagnosis not present

## 2022-04-23 DIAGNOSIS — M25512 Pain in left shoulder: Secondary | ICD-10-CM | POA: Diagnosis not present

## 2022-04-23 DIAGNOSIS — M542 Cervicalgia: Secondary | ICD-10-CM | POA: Diagnosis not present

## 2022-05-05 DIAGNOSIS — M6281 Muscle weakness (generalized): Secondary | ICD-10-CM | POA: Diagnosis not present

## 2022-05-05 DIAGNOSIS — M542 Cervicalgia: Secondary | ICD-10-CM | POA: Diagnosis not present

## 2022-05-05 DIAGNOSIS — M25561 Pain in right knee: Secondary | ICD-10-CM | POA: Diagnosis not present

## 2022-05-05 DIAGNOSIS — M25512 Pain in left shoulder: Secondary | ICD-10-CM | POA: Diagnosis not present

## 2022-05-21 DIAGNOSIS — M25561 Pain in right knee: Secondary | ICD-10-CM | POA: Diagnosis not present

## 2022-05-21 DIAGNOSIS — M6281 Muscle weakness (generalized): Secondary | ICD-10-CM | POA: Diagnosis not present

## 2022-05-21 DIAGNOSIS — M25512 Pain in left shoulder: Secondary | ICD-10-CM | POA: Diagnosis not present

## 2022-05-21 DIAGNOSIS — M542 Cervicalgia: Secondary | ICD-10-CM | POA: Diagnosis not present

## 2022-06-03 DIAGNOSIS — M25512 Pain in left shoulder: Secondary | ICD-10-CM | POA: Diagnosis not present

## 2022-06-03 DIAGNOSIS — M25561 Pain in right knee: Secondary | ICD-10-CM | POA: Diagnosis not present

## 2022-06-03 DIAGNOSIS — M542 Cervicalgia: Secondary | ICD-10-CM | POA: Diagnosis not present

## 2022-06-03 DIAGNOSIS — M6281 Muscle weakness (generalized): Secondary | ICD-10-CM | POA: Diagnosis not present

## 2022-06-22 DIAGNOSIS — M25512 Pain in left shoulder: Secondary | ICD-10-CM | POA: Diagnosis not present

## 2022-06-22 DIAGNOSIS — M542 Cervicalgia: Secondary | ICD-10-CM | POA: Diagnosis not present

## 2022-06-22 DIAGNOSIS — M25561 Pain in right knee: Secondary | ICD-10-CM | POA: Diagnosis not present

## 2022-06-22 DIAGNOSIS — M6281 Muscle weakness (generalized): Secondary | ICD-10-CM | POA: Diagnosis not present

## 2022-07-07 DIAGNOSIS — M542 Cervicalgia: Secondary | ICD-10-CM | POA: Diagnosis not present

## 2022-07-07 DIAGNOSIS — M25561 Pain in right knee: Secondary | ICD-10-CM | POA: Diagnosis not present

## 2022-07-07 DIAGNOSIS — M25512 Pain in left shoulder: Secondary | ICD-10-CM | POA: Diagnosis not present

## 2022-07-07 DIAGNOSIS — M6281 Muscle weakness (generalized): Secondary | ICD-10-CM | POA: Diagnosis not present

## 2022-07-19 DIAGNOSIS — M25512 Pain in left shoulder: Secondary | ICD-10-CM | POA: Diagnosis not present

## 2022-07-19 DIAGNOSIS — M25561 Pain in right knee: Secondary | ICD-10-CM | POA: Diagnosis not present

## 2022-07-19 DIAGNOSIS — M542 Cervicalgia: Secondary | ICD-10-CM | POA: Diagnosis not present

## 2022-07-19 DIAGNOSIS — M6281 Muscle weakness (generalized): Secondary | ICD-10-CM | POA: Diagnosis not present

## 2022-08-04 DIAGNOSIS — M6281 Muscle weakness (generalized): Secondary | ICD-10-CM | POA: Diagnosis not present

## 2022-08-04 DIAGNOSIS — M25512 Pain in left shoulder: Secondary | ICD-10-CM | POA: Diagnosis not present

## 2022-08-04 DIAGNOSIS — M542 Cervicalgia: Secondary | ICD-10-CM | POA: Diagnosis not present

## 2022-08-04 DIAGNOSIS — M25561 Pain in right knee: Secondary | ICD-10-CM | POA: Diagnosis not present

## 2022-08-18 DIAGNOSIS — M6281 Muscle weakness (generalized): Secondary | ICD-10-CM | POA: Diagnosis not present

## 2022-08-18 DIAGNOSIS — M25512 Pain in left shoulder: Secondary | ICD-10-CM | POA: Diagnosis not present

## 2022-08-18 DIAGNOSIS — M25561 Pain in right knee: Secondary | ICD-10-CM | POA: Diagnosis not present

## 2022-08-18 DIAGNOSIS — M542 Cervicalgia: Secondary | ICD-10-CM | POA: Diagnosis not present

## 2022-09-01 DIAGNOSIS — M6281 Muscle weakness (generalized): Secondary | ICD-10-CM | POA: Diagnosis not present

## 2022-09-01 DIAGNOSIS — M25561 Pain in right knee: Secondary | ICD-10-CM | POA: Diagnosis not present

## 2022-09-01 DIAGNOSIS — M25512 Pain in left shoulder: Secondary | ICD-10-CM | POA: Diagnosis not present

## 2022-09-01 DIAGNOSIS — M542 Cervicalgia: Secondary | ICD-10-CM | POA: Diagnosis not present

## 2022-09-27 DIAGNOSIS — J019 Acute sinusitis, unspecified: Secondary | ICD-10-CM | POA: Diagnosis not present

## 2022-10-01 DIAGNOSIS — M6281 Muscle weakness (generalized): Secondary | ICD-10-CM | POA: Diagnosis not present

## 2022-10-01 DIAGNOSIS — M25512 Pain in left shoulder: Secondary | ICD-10-CM | POA: Diagnosis not present

## 2022-10-01 DIAGNOSIS — M542 Cervicalgia: Secondary | ICD-10-CM | POA: Diagnosis not present

## 2022-10-01 DIAGNOSIS — M25561 Pain in right knee: Secondary | ICD-10-CM | POA: Diagnosis not present

## 2022-10-05 ENCOUNTER — Other Ambulatory Visit: Payer: Self-pay | Admitting: Family Medicine

## 2022-10-05 DIAGNOSIS — Z1231 Encounter for screening mammogram for malignant neoplasm of breast: Secondary | ICD-10-CM

## 2022-10-15 DIAGNOSIS — M25512 Pain in left shoulder: Secondary | ICD-10-CM | POA: Diagnosis not present

## 2022-10-15 DIAGNOSIS — M6281 Muscle weakness (generalized): Secondary | ICD-10-CM | POA: Diagnosis not present

## 2022-10-15 DIAGNOSIS — M542 Cervicalgia: Secondary | ICD-10-CM | POA: Diagnosis not present

## 2022-10-15 DIAGNOSIS — M25561 Pain in right knee: Secondary | ICD-10-CM | POA: Diagnosis not present

## 2022-10-29 DIAGNOSIS — M542 Cervicalgia: Secondary | ICD-10-CM | POA: Diagnosis not present

## 2022-10-29 DIAGNOSIS — M25512 Pain in left shoulder: Secondary | ICD-10-CM | POA: Diagnosis not present

## 2022-10-29 DIAGNOSIS — M25561 Pain in right knee: Secondary | ICD-10-CM | POA: Diagnosis not present

## 2022-10-29 DIAGNOSIS — M6281 Muscle weakness (generalized): Secondary | ICD-10-CM | POA: Diagnosis not present

## 2022-11-12 DIAGNOSIS — M25561 Pain in right knee: Secondary | ICD-10-CM | POA: Diagnosis not present

## 2022-11-12 DIAGNOSIS — M6281 Muscle weakness (generalized): Secondary | ICD-10-CM | POA: Diagnosis not present

## 2022-11-12 DIAGNOSIS — M542 Cervicalgia: Secondary | ICD-10-CM | POA: Diagnosis not present

## 2022-11-12 DIAGNOSIS — M25512 Pain in left shoulder: Secondary | ICD-10-CM | POA: Diagnosis not present

## 2022-11-15 DIAGNOSIS — I10 Essential (primary) hypertension: Secondary | ICD-10-CM | POA: Diagnosis not present

## 2022-11-15 DIAGNOSIS — E876 Hypokalemia: Secondary | ICD-10-CM | POA: Diagnosis not present

## 2022-11-15 DIAGNOSIS — Z0000 Encounter for general adult medical examination without abnormal findings: Secondary | ICD-10-CM | POA: Diagnosis not present

## 2022-11-15 DIAGNOSIS — D72829 Elevated white blood cell count, unspecified: Secondary | ICD-10-CM | POA: Diagnosis not present

## 2022-11-15 DIAGNOSIS — J309 Allergic rhinitis, unspecified: Secondary | ICD-10-CM | POA: Diagnosis not present

## 2022-11-15 DIAGNOSIS — Z13220 Encounter for screening for lipoid disorders: Secondary | ICD-10-CM | POA: Diagnosis not present

## 2022-11-26 DIAGNOSIS — M542 Cervicalgia: Secondary | ICD-10-CM | POA: Diagnosis not present

## 2022-11-26 DIAGNOSIS — M25561 Pain in right knee: Secondary | ICD-10-CM | POA: Diagnosis not present

## 2022-11-26 DIAGNOSIS — M6281 Muscle weakness (generalized): Secondary | ICD-10-CM | POA: Diagnosis not present

## 2022-11-26 DIAGNOSIS — M25512 Pain in left shoulder: Secondary | ICD-10-CM | POA: Diagnosis not present

## 2022-12-02 DIAGNOSIS — Z6823 Body mass index (BMI) 23.0-23.9, adult: Secondary | ICD-10-CM | POA: Diagnosis not present

## 2022-12-02 DIAGNOSIS — Z01419 Encounter for gynecological examination (general) (routine) without abnormal findings: Secondary | ICD-10-CM | POA: Diagnosis not present

## 2022-12-16 ENCOUNTER — Ambulatory Visit
Admission: RE | Admit: 2022-12-16 | Discharge: 2022-12-16 | Disposition: A | Discharge status: Home / Self Care | Payer: BC Managed Care – PPO | Source: Ambulatory Visit | Attending: Family Medicine | Admitting: Family Medicine

## 2022-12-16 DIAGNOSIS — Z1231 Encounter for screening mammogram for malignant neoplasm of breast: Secondary | ICD-10-CM

## 2022-12-23 DIAGNOSIS — Z0000 Encounter for general adult medical examination without abnormal findings: Secondary | ICD-10-CM | POA: Diagnosis not present

## 2022-12-23 DIAGNOSIS — E876 Hypokalemia: Secondary | ICD-10-CM | POA: Diagnosis not present

## 2022-12-23 DIAGNOSIS — T7840XA Allergy, unspecified, initial encounter: Secondary | ICD-10-CM | POA: Diagnosis not present

## 2022-12-23 DIAGNOSIS — Z90710 Acquired absence of both cervix and uterus: Secondary | ICD-10-CM | POA: Diagnosis not present

## 2022-12-23 DIAGNOSIS — I10 Essential (primary) hypertension: Secondary | ICD-10-CM | POA: Diagnosis not present

## 2022-12-24 DIAGNOSIS — M542 Cervicalgia: Secondary | ICD-10-CM | POA: Diagnosis not present

## 2022-12-24 DIAGNOSIS — M6281 Muscle weakness (generalized): Secondary | ICD-10-CM | POA: Diagnosis not present

## 2022-12-24 DIAGNOSIS — M25512 Pain in left shoulder: Secondary | ICD-10-CM | POA: Diagnosis not present

## 2022-12-24 DIAGNOSIS — M25561 Pain in right knee: Secondary | ICD-10-CM | POA: Diagnosis not present

## 2023-01-07 DIAGNOSIS — M25512 Pain in left shoulder: Secondary | ICD-10-CM | POA: Diagnosis not present

## 2023-01-07 DIAGNOSIS — M6281 Muscle weakness (generalized): Secondary | ICD-10-CM | POA: Diagnosis not present

## 2023-01-07 DIAGNOSIS — M25561 Pain in right knee: Secondary | ICD-10-CM | POA: Diagnosis not present

## 2023-01-07 DIAGNOSIS — M542 Cervicalgia: Secondary | ICD-10-CM | POA: Diagnosis not present

## 2023-01-14 DIAGNOSIS — I10 Essential (primary) hypertension: Secondary | ICD-10-CM | POA: Diagnosis not present

## 2023-01-14 DIAGNOSIS — H109 Unspecified conjunctivitis: Secondary | ICD-10-CM | POA: Diagnosis not present

## 2023-01-21 DIAGNOSIS — M25512 Pain in left shoulder: Secondary | ICD-10-CM | POA: Diagnosis not present

## 2023-01-21 DIAGNOSIS — M542 Cervicalgia: Secondary | ICD-10-CM | POA: Diagnosis not present

## 2023-01-21 DIAGNOSIS — M25561 Pain in right knee: Secondary | ICD-10-CM | POA: Diagnosis not present

## 2023-01-21 DIAGNOSIS — M6281 Muscle weakness (generalized): Secondary | ICD-10-CM | POA: Diagnosis not present

## 2023-02-04 DIAGNOSIS — M542 Cervicalgia: Secondary | ICD-10-CM | POA: Diagnosis not present

## 2023-02-04 DIAGNOSIS — M25512 Pain in left shoulder: Secondary | ICD-10-CM | POA: Diagnosis not present

## 2023-02-04 DIAGNOSIS — M6281 Muscle weakness (generalized): Secondary | ICD-10-CM | POA: Diagnosis not present

## 2023-02-04 DIAGNOSIS — M25561 Pain in right knee: Secondary | ICD-10-CM | POA: Diagnosis not present

## 2023-02-18 DIAGNOSIS — M542 Cervicalgia: Secondary | ICD-10-CM | POA: Diagnosis not present

## 2023-02-18 DIAGNOSIS — M6281 Muscle weakness (generalized): Secondary | ICD-10-CM | POA: Diagnosis not present

## 2023-02-18 DIAGNOSIS — M25512 Pain in left shoulder: Secondary | ICD-10-CM | POA: Diagnosis not present

## 2023-02-18 DIAGNOSIS — M25561 Pain in right knee: Secondary | ICD-10-CM | POA: Diagnosis not present

## 2023-03-04 DIAGNOSIS — M25512 Pain in left shoulder: Secondary | ICD-10-CM | POA: Diagnosis not present

## 2023-03-04 DIAGNOSIS — M6281 Muscle weakness (generalized): Secondary | ICD-10-CM | POA: Diagnosis not present

## 2023-03-04 DIAGNOSIS — M25561 Pain in right knee: Secondary | ICD-10-CM | POA: Diagnosis not present

## 2023-03-04 DIAGNOSIS — M542 Cervicalgia: Secondary | ICD-10-CM | POA: Diagnosis not present

## 2023-03-18 DIAGNOSIS — M25561 Pain in right knee: Secondary | ICD-10-CM | POA: Diagnosis not present

## 2023-03-18 DIAGNOSIS — M542 Cervicalgia: Secondary | ICD-10-CM | POA: Diagnosis not present

## 2023-03-18 DIAGNOSIS — M6281 Muscle weakness (generalized): Secondary | ICD-10-CM | POA: Diagnosis not present

## 2023-03-18 DIAGNOSIS — M25512 Pain in left shoulder: Secondary | ICD-10-CM | POA: Diagnosis not present

## 2023-04-01 DIAGNOSIS — M25561 Pain in right knee: Secondary | ICD-10-CM | POA: Diagnosis not present

## 2023-04-01 DIAGNOSIS — M6281 Muscle weakness (generalized): Secondary | ICD-10-CM | POA: Diagnosis not present

## 2023-04-01 DIAGNOSIS — M542 Cervicalgia: Secondary | ICD-10-CM | POA: Diagnosis not present

## 2023-04-01 DIAGNOSIS — M25512 Pain in left shoulder: Secondary | ICD-10-CM | POA: Diagnosis not present

## 2023-04-15 DIAGNOSIS — M25561 Pain in right knee: Secondary | ICD-10-CM | POA: Diagnosis not present

## 2023-04-15 DIAGNOSIS — M25512 Pain in left shoulder: Secondary | ICD-10-CM | POA: Diagnosis not present

## 2023-04-15 DIAGNOSIS — M542 Cervicalgia: Secondary | ICD-10-CM | POA: Diagnosis not present

## 2023-04-15 DIAGNOSIS — M6281 Muscle weakness (generalized): Secondary | ICD-10-CM | POA: Diagnosis not present

## 2023-04-27 DIAGNOSIS — M25512 Pain in left shoulder: Secondary | ICD-10-CM | POA: Diagnosis not present

## 2023-04-27 DIAGNOSIS — M6281 Muscle weakness (generalized): Secondary | ICD-10-CM | POA: Diagnosis not present

## 2023-04-27 DIAGNOSIS — M542 Cervicalgia: Secondary | ICD-10-CM | POA: Diagnosis not present

## 2023-04-27 DIAGNOSIS — M25561 Pain in right knee: Secondary | ICD-10-CM | POA: Diagnosis not present

## 2023-05-13 DIAGNOSIS — M6281 Muscle weakness (generalized): Secondary | ICD-10-CM | POA: Diagnosis not present

## 2023-05-13 DIAGNOSIS — M25512 Pain in left shoulder: Secondary | ICD-10-CM | POA: Diagnosis not present

## 2023-05-13 DIAGNOSIS — M542 Cervicalgia: Secondary | ICD-10-CM | POA: Diagnosis not present

## 2023-05-13 DIAGNOSIS — M25561 Pain in right knee: Secondary | ICD-10-CM | POA: Diagnosis not present

## 2023-05-27 DIAGNOSIS — M25561 Pain in right knee: Secondary | ICD-10-CM | POA: Diagnosis not present

## 2023-05-27 DIAGNOSIS — M6281 Muscle weakness (generalized): Secondary | ICD-10-CM | POA: Diagnosis not present

## 2023-05-27 DIAGNOSIS — M25512 Pain in left shoulder: Secondary | ICD-10-CM | POA: Diagnosis not present

## 2023-05-27 DIAGNOSIS — M542 Cervicalgia: Secondary | ICD-10-CM | POA: Diagnosis not present

## 2023-05-30 DIAGNOSIS — Z0000 Encounter for general adult medical examination without abnormal findings: Secondary | ICD-10-CM | POA: Diagnosis not present

## 2023-05-30 DIAGNOSIS — E785 Hyperlipidemia, unspecified: Secondary | ICD-10-CM | POA: Diagnosis not present

## 2023-05-30 DIAGNOSIS — Z131 Encounter for screening for diabetes mellitus: Secondary | ICD-10-CM | POA: Diagnosis not present

## 2023-05-30 DIAGNOSIS — N189 Chronic kidney disease, unspecified: Secondary | ICD-10-CM | POA: Diagnosis not present

## 2023-05-30 DIAGNOSIS — I10 Essential (primary) hypertension: Secondary | ICD-10-CM | POA: Diagnosis not present

## 2023-05-30 DIAGNOSIS — H722X1 Other marginal perforations of tympanic membrane, right ear: Secondary | ICD-10-CM | POA: Diagnosis not present

## 2023-06-10 DIAGNOSIS — M25512 Pain in left shoulder: Secondary | ICD-10-CM | POA: Diagnosis not present

## 2023-06-10 DIAGNOSIS — M6281 Muscle weakness (generalized): Secondary | ICD-10-CM | POA: Diagnosis not present

## 2023-06-10 DIAGNOSIS — M542 Cervicalgia: Secondary | ICD-10-CM | POA: Diagnosis not present

## 2023-06-10 DIAGNOSIS — M25561 Pain in right knee: Secondary | ICD-10-CM | POA: Diagnosis not present

## 2023-06-29 DIAGNOSIS — M25561 Pain in right knee: Secondary | ICD-10-CM | POA: Diagnosis not present

## 2023-06-29 DIAGNOSIS — M25512 Pain in left shoulder: Secondary | ICD-10-CM | POA: Diagnosis not present

## 2023-06-29 DIAGNOSIS — M6281 Muscle weakness (generalized): Secondary | ICD-10-CM | POA: Diagnosis not present

## 2023-06-29 DIAGNOSIS — M542 Cervicalgia: Secondary | ICD-10-CM | POA: Diagnosis not present

## 2023-07-12 DIAGNOSIS — M25512 Pain in left shoulder: Secondary | ICD-10-CM | POA: Diagnosis not present

## 2023-07-12 DIAGNOSIS — M6281 Muscle weakness (generalized): Secondary | ICD-10-CM | POA: Diagnosis not present

## 2023-07-12 DIAGNOSIS — M25561 Pain in right knee: Secondary | ICD-10-CM | POA: Diagnosis not present

## 2023-07-12 DIAGNOSIS — M542 Cervicalgia: Secondary | ICD-10-CM | POA: Diagnosis not present

## 2023-07-28 DIAGNOSIS — E876 Hypokalemia: Secondary | ICD-10-CM | POA: Diagnosis not present

## 2023-07-28 DIAGNOSIS — D751 Secondary polycythemia: Secondary | ICD-10-CM | POA: Diagnosis not present

## 2023-07-29 DIAGNOSIS — M25512 Pain in left shoulder: Secondary | ICD-10-CM | POA: Diagnosis not present

## 2023-07-29 DIAGNOSIS — M6281 Muscle weakness (generalized): Secondary | ICD-10-CM | POA: Diagnosis not present

## 2023-07-29 DIAGNOSIS — M542 Cervicalgia: Secondary | ICD-10-CM | POA: Diagnosis not present

## 2023-07-29 DIAGNOSIS — M25561 Pain in right knee: Secondary | ICD-10-CM | POA: Diagnosis not present

## 2023-08-08 DIAGNOSIS — H353133 Nonexudative age-related macular degeneration, bilateral, advanced atrophic without subfoveal involvement: Secondary | ICD-10-CM | POA: Diagnosis not present

## 2023-08-08 DIAGNOSIS — H2513 Age-related nuclear cataract, bilateral: Secondary | ICD-10-CM | POA: Diagnosis not present

## 2023-08-08 DIAGNOSIS — H52203 Unspecified astigmatism, bilateral: Secondary | ICD-10-CM | POA: Diagnosis not present

## 2023-08-12 DIAGNOSIS — M25512 Pain in left shoulder: Secondary | ICD-10-CM | POA: Diagnosis not present

## 2023-08-12 DIAGNOSIS — M6281 Muscle weakness (generalized): Secondary | ICD-10-CM | POA: Diagnosis not present

## 2023-08-12 DIAGNOSIS — M542 Cervicalgia: Secondary | ICD-10-CM | POA: Diagnosis not present

## 2023-08-12 DIAGNOSIS — M25561 Pain in right knee: Secondary | ICD-10-CM | POA: Diagnosis not present

## 2023-08-26 DIAGNOSIS — M25512 Pain in left shoulder: Secondary | ICD-10-CM | POA: Diagnosis not present

## 2023-08-26 DIAGNOSIS — M542 Cervicalgia: Secondary | ICD-10-CM | POA: Diagnosis not present

## 2023-08-26 DIAGNOSIS — M6281 Muscle weakness (generalized): Secondary | ICD-10-CM | POA: Diagnosis not present

## 2023-08-26 DIAGNOSIS — M25561 Pain in right knee: Secondary | ICD-10-CM | POA: Diagnosis not present

## 2023-09-08 DIAGNOSIS — M25561 Pain in right knee: Secondary | ICD-10-CM | POA: Diagnosis not present

## 2023-09-08 DIAGNOSIS — M6281 Muscle weakness (generalized): Secondary | ICD-10-CM | POA: Diagnosis not present

## 2023-09-08 DIAGNOSIS — M542 Cervicalgia: Secondary | ICD-10-CM | POA: Diagnosis not present

## 2023-09-08 DIAGNOSIS — M25512 Pain in left shoulder: Secondary | ICD-10-CM | POA: Diagnosis not present

## 2023-09-19 DIAGNOSIS — M6281 Muscle weakness (generalized): Secondary | ICD-10-CM | POA: Diagnosis not present

## 2023-09-19 DIAGNOSIS — M25512 Pain in left shoulder: Secondary | ICD-10-CM | POA: Diagnosis not present

## 2023-09-19 DIAGNOSIS — M542 Cervicalgia: Secondary | ICD-10-CM | POA: Diagnosis not present

## 2023-09-19 DIAGNOSIS — M25561 Pain in right knee: Secondary | ICD-10-CM | POA: Diagnosis not present

## 2023-09-26 ENCOUNTER — Other Ambulatory Visit: Payer: Self-pay | Admitting: Family Medicine

## 2023-09-26 DIAGNOSIS — Z1231 Encounter for screening mammogram for malignant neoplasm of breast: Secondary | ICD-10-CM

## 2023-10-07 DIAGNOSIS — M25561 Pain in right knee: Secondary | ICD-10-CM | POA: Diagnosis not present

## 2023-10-07 DIAGNOSIS — M6281 Muscle weakness (generalized): Secondary | ICD-10-CM | POA: Diagnosis not present

## 2023-10-07 DIAGNOSIS — M542 Cervicalgia: Secondary | ICD-10-CM | POA: Diagnosis not present

## 2023-10-07 DIAGNOSIS — M25512 Pain in left shoulder: Secondary | ICD-10-CM | POA: Diagnosis not present

## 2023-10-21 DIAGNOSIS — M542 Cervicalgia: Secondary | ICD-10-CM | POA: Diagnosis not present

## 2023-10-21 DIAGNOSIS — M25561 Pain in right knee: Secondary | ICD-10-CM | POA: Diagnosis not present

## 2023-10-21 DIAGNOSIS — M25512 Pain in left shoulder: Secondary | ICD-10-CM | POA: Diagnosis not present

## 2023-10-21 DIAGNOSIS — M6281 Muscle weakness (generalized): Secondary | ICD-10-CM | POA: Diagnosis not present

## 2023-11-04 DIAGNOSIS — M6281 Muscle weakness (generalized): Secondary | ICD-10-CM | POA: Diagnosis not present

## 2023-11-04 DIAGNOSIS — M542 Cervicalgia: Secondary | ICD-10-CM | POA: Diagnosis not present

## 2023-11-04 DIAGNOSIS — M25512 Pain in left shoulder: Secondary | ICD-10-CM | POA: Diagnosis not present

## 2023-11-04 DIAGNOSIS — M25561 Pain in right knee: Secondary | ICD-10-CM | POA: Diagnosis not present

## 2023-12-02 DIAGNOSIS — I10 Essential (primary) hypertension: Secondary | ICD-10-CM | POA: Diagnosis not present

## 2023-12-02 DIAGNOSIS — E785 Hyperlipidemia, unspecified: Secondary | ICD-10-CM | POA: Diagnosis not present

## 2023-12-02 DIAGNOSIS — M25512 Pain in left shoulder: Secondary | ICD-10-CM | POA: Diagnosis not present

## 2023-12-02 DIAGNOSIS — M6281 Muscle weakness (generalized): Secondary | ICD-10-CM | POA: Diagnosis not present

## 2023-12-02 DIAGNOSIS — M542 Cervicalgia: Secondary | ICD-10-CM | POA: Diagnosis not present

## 2023-12-02 DIAGNOSIS — M25561 Pain in right knee: Secondary | ICD-10-CM | POA: Diagnosis not present

## 2023-12-02 DIAGNOSIS — Z131 Encounter for screening for diabetes mellitus: Secondary | ICD-10-CM | POA: Diagnosis not present

## 2023-12-02 DIAGNOSIS — E876 Hypokalemia: Secondary | ICD-10-CM | POA: Diagnosis not present

## 2023-12-13 DIAGNOSIS — Z6822 Body mass index (BMI) 22.0-22.9, adult: Secondary | ICD-10-CM | POA: Diagnosis not present

## 2023-12-13 DIAGNOSIS — Z01419 Encounter for gynecological examination (general) (routine) without abnormal findings: Secondary | ICD-10-CM | POA: Diagnosis not present

## 2023-12-26 ENCOUNTER — Ambulatory Visit
Admission: RE | Admit: 2023-12-26 | Discharge: 2023-12-26 | Disposition: A | Discharge status: Home / Self Care | Payer: BC Managed Care – PPO | Source: Ambulatory Visit | Attending: Family Medicine | Admitting: Family Medicine

## 2023-12-26 DIAGNOSIS — Z1231 Encounter for screening mammogram for malignant neoplasm of breast: Secondary | ICD-10-CM

## 2023-12-30 ENCOUNTER — Ambulatory Visit: Payer: BC Managed Care – PPO

## 2024-11-21 ENCOUNTER — Other Ambulatory Visit: Payer: Self-pay

## 2024-11-21 DIAGNOSIS — Z1231 Encounter for screening mammogram for malignant neoplasm of breast: Secondary | ICD-10-CM

## 2024-12-28 ENCOUNTER — Ambulatory Visit

## 2025-01-11 ENCOUNTER — Ambulatory Visit: Admission: RE | Admit: 2025-01-11 | Discharge: 2025-01-11 | Disposition: A | Source: Ambulatory Visit

## 2025-01-11 DIAGNOSIS — Z1231 Encounter for screening mammogram for malignant neoplasm of breast: Secondary | ICD-10-CM
